# Patient Record
Sex: Female | Born: 1960 | Race: Black or African American | Hispanic: No | Smoking: Never smoker
Health system: Southern US, Community
[De-identification: ages and names within clinical notes are randomized; demographics above are authoritative.]

## PROBLEM LIST (undated history)

## (undated) DIAGNOSIS — G4730 Sleep apnea, unspecified: Secondary | ICD-10-CM

## (undated) DIAGNOSIS — N3010 Interstitial cystitis (chronic) without hematuria: Secondary | ICD-10-CM

## (undated) DIAGNOSIS — G894 Chronic pain syndrome: Secondary | ICD-10-CM

## (undated) DIAGNOSIS — M109 Gout, unspecified: Secondary | ICD-10-CM

## (undated) DIAGNOSIS — M4800 Spinal stenosis, site unspecified: Secondary | ICD-10-CM

## (undated) DIAGNOSIS — J189 Pneumonia, unspecified organism: Secondary | ICD-10-CM

## (undated) DIAGNOSIS — T8859XA Other complications of anesthesia, initial encounter: Secondary | ICD-10-CM

## (undated) DIAGNOSIS — K219 Gastro-esophageal reflux disease without esophagitis: Secondary | ICD-10-CM

## (undated) DIAGNOSIS — I10 Essential (primary) hypertension: Secondary | ICD-10-CM

## (undated) DIAGNOSIS — M797 Fibromyalgia: Secondary | ICD-10-CM

## (undated) HISTORY — PX: ABDOMINAL HYSTERECTOMY: SHX81

## (undated) HISTORY — PX: ROTATOR CUFF REPAIR: SHX139

## (undated) HISTORY — PX: THYROIDECTOMY: SHX17

## (undated) HISTORY — PX: OTHER SURGICAL HISTORY: SHX169

## (undated) HISTORY — PX: BLADDER SUSPENSION: SHX72

## (undated) HISTORY — PX: APPENDECTOMY: SHX54

## (undated) HISTORY — PX: TUBAL LIGATION: SHX77

## (undated) HISTORY — PX: ECTOPIC PREGNANCY SURGERY: SHX613

---

## 1992-07-26 DIAGNOSIS — K589 Irritable bowel syndrome without diarrhea: Secondary | ICD-10-CM | POA: Insufficient documentation

## 1993-07-26 DIAGNOSIS — L409 Psoriasis, unspecified: Secondary | ICD-10-CM | POA: Insufficient documentation

## 2002-07-27 DIAGNOSIS — K219 Gastro-esophageal reflux disease without esophagitis: Secondary | ICD-10-CM | POA: Insufficient documentation

## 2004-03-02 ENCOUNTER — Emergency Department: Payer: Self-pay | Admitting: Emergency Medicine

## 2004-03-09 ENCOUNTER — Encounter: Admission: RE | Admit: 2004-03-09 | Discharge: 2004-03-09 | Payer: Self-pay | Admitting: Unknown Physician Specialty

## 2004-04-15 ENCOUNTER — Other Ambulatory Visit: Payer: Self-pay

## 2004-04-15 ENCOUNTER — Inpatient Hospital Stay: Payer: Self-pay | Admitting: Anesthesiology

## 2005-05-13 ENCOUNTER — Emergency Department: Payer: Self-pay | Admitting: Emergency Medicine

## 2005-05-16 ENCOUNTER — Ambulatory Visit: Payer: Self-pay | Admitting: Emergency Medicine

## 2009-12-17 ENCOUNTER — Emergency Department: Payer: Self-pay | Admitting: Emergency Medicine

## 2009-12-27 ENCOUNTER — Emergency Department: Payer: Self-pay | Admitting: Emergency Medicine

## 2009-12-31 ENCOUNTER — Inpatient Hospital Stay: Payer: Self-pay | Admitting: Surgery

## 2010-01-07 ENCOUNTER — Ambulatory Visit: Payer: Self-pay | Admitting: Cardiovascular Disease

## 2010-08-15 ENCOUNTER — Inpatient Hospital Stay: Payer: Self-pay | Admitting: Internal Medicine

## 2010-12-26 ENCOUNTER — Ambulatory Visit: Payer: Self-pay | Admitting: Gastroenterology

## 2011-01-30 ENCOUNTER — Inpatient Hospital Stay: Payer: Self-pay | Admitting: Internal Medicine

## 2011-02-03 LAB — PATHOLOGY REPORT

## 2011-02-07 DIAGNOSIS — E039 Hypothyroidism, unspecified: Secondary | ICD-10-CM | POA: Insufficient documentation

## 2011-06-23 ENCOUNTER — Ambulatory Visit: Payer: Self-pay | Admitting: Rehabilitation

## 2011-07-27 DIAGNOSIS — N3010 Interstitial cystitis (chronic) without hematuria: Secondary | ICD-10-CM | POA: Insufficient documentation

## 2011-08-03 DIAGNOSIS — M48061 Spinal stenosis, lumbar region without neurogenic claudication: Secondary | ICD-10-CM | POA: Insufficient documentation

## 2011-09-24 ENCOUNTER — Emergency Department: Payer: Self-pay | Admitting: Emergency Medicine

## 2011-09-24 LAB — COMPREHENSIVE METABOLIC PANEL
Albumin: 3.8 g/dL (ref 3.4–5.0)
Alkaline Phosphatase: 79 U/L (ref 50–136)
Anion Gap: 7 (ref 7–16)
BUN: 17 mg/dL (ref 7–18)
Bilirubin,Total: 0.3 mg/dL (ref 0.2–1.0)
Calcium, Total: 8.6 mg/dL (ref 8.5–10.1)
Chloride: 108 mmol/L — ABNORMAL HIGH (ref 98–107)
Co2: 26 mmol/L (ref 21–32)
Creatinine: 1.14 mg/dL (ref 0.60–1.30)
EGFR (African American): >60
EGFR (Non-African Amer.): 56 — ABNORMAL LOW
Glucose: 98 mg/dL (ref 65–99)
Osmolality: 283 (ref 275–301)
Potassium: 3.9 mmol/L (ref 3.5–5.1)
SGOT(AST): 18 U/L (ref 15–37)
SGPT (ALT): 18 U/L
Sodium: 141 mmol/L (ref 136–145)
Total Protein: 7.7 g/dL (ref 6.4–8.2)

## 2011-09-24 LAB — CBC
HCT: 38.1 % (ref 35.0–47.0)
HGB: 12.6 g/dL (ref 12.0–16.0)
MCH: 29.9 pg (ref 26.0–34.0)
MCHC: 33.1 g/dL (ref 32.0–36.0)
MCV: 90 fL (ref 80–100)
Platelet: 271 10*3/uL (ref 150–440)
RBC: 4.22 10*6/uL (ref 3.80–5.20)
RDW: 13.4 % (ref 11.5–14.5)
WBC: 7.8 10*3/uL (ref 3.6–11.0)

## 2011-09-24 LAB — URINALYSIS, COMPLETE
Bilirubin,UR: NEGATIVE
Blood: NEGATIVE
Glucose,UR: NEGATIVE mg/dL (ref 0–75)
Ketone: NEGATIVE
Leukocyte Esterase: NEGATIVE
Nitrite: NEGATIVE
Ph: 6 (ref 4.5–8.0)
Protein: NEGATIVE
RBC,UR: <1 /HPF (ref 0–5)
Specific Gravity: 1.027 (ref 1.003–1.030)
Squamous Epithelial: <1
WBC UR: <1 /HPF (ref 0–5)

## 2011-09-24 LAB — LIPASE, BLOOD: Lipase: 107 U/L (ref 73–393)

## 2011-10-19 ENCOUNTER — Emergency Department: Payer: Self-pay

## 2011-10-19 LAB — COMPREHENSIVE METABOLIC PANEL
Albumin: 3.6 g/dL (ref 3.4–5.0)
Alkaline Phosphatase: 89 U/L (ref 50–136)
Anion Gap: 7 (ref 7–16)
BUN: 17 mg/dL (ref 7–18)
Bilirubin,Total: 0.3 mg/dL (ref 0.2–1.0)
Calcium, Total: 8.9 mg/dL (ref 8.5–10.1)
Chloride: 106 mmol/L (ref 98–107)
Co2: 29 mmol/L (ref 21–32)
Creatinine: 1.29 mg/dL (ref 0.60–1.30)
EGFR (African American): 55 — ABNORMAL LOW
EGFR (Non-African Amer.): 48 — ABNORMAL LOW
Glucose: 81 mg/dL (ref 65–99)
Osmolality: 284 (ref 275–301)
Potassium: 4.3 mmol/L (ref 3.5–5.1)
SGOT(AST): 16 U/L (ref 15–37)
SGPT (ALT): 17 U/L
Sodium: 142 mmol/L (ref 136–145)
Total Protein: 7.6 g/dL (ref 6.4–8.2)

## 2011-10-19 LAB — URINALYSIS, COMPLETE
Bacteria: NONE SEEN
Bilirubin,UR: NEGATIVE
Blood: NEGATIVE
Glucose,UR: NEGATIVE mg/dL (ref 0–75)
Ketone: NEGATIVE
Leukocyte Esterase: NEGATIVE
Nitrite: NEGATIVE
Ph: 5 (ref 4.5–8.0)
Protein: NEGATIVE
RBC,UR: 1 /HPF (ref 0–5)
Specific Gravity: 1.028 (ref 1.003–1.030)
Squamous Epithelial: NONE SEEN
WBC UR: 1 /HPF (ref 0–5)

## 2011-10-19 LAB — CBC
HCT: 38.4 % (ref 35.0–47.0)
HGB: 12 g/dL (ref 12.0–16.0)
MCH: 28.4 pg (ref 26.0–34.0)
MCHC: 31.2 g/dL — ABNORMAL LOW (ref 32.0–36.0)
MCV: 91 fL (ref 80–100)
Platelet: 292 10*3/uL (ref 150–440)
RBC: 4.21 10*6/uL (ref 3.80–5.20)
RDW: 13.5 % (ref 11.5–14.5)
WBC: 6.8 10*3/uL (ref 3.6–11.0)

## 2011-10-19 LAB — LIPASE, BLOOD: Lipase: 95 U/L (ref 73–393)

## 2011-10-30 ENCOUNTER — Ambulatory Visit: Payer: Self-pay | Admitting: Gastroenterology

## 2011-11-15 ENCOUNTER — Emergency Department: Payer: Self-pay

## 2011-11-15 LAB — CBC
HCT: 41.1 % (ref 35.0–47.0)
HGB: 12.9 g/dL (ref 12.0–16.0)
MCH: 28.8 pg (ref 26.0–34.0)
MCHC: 31.4 g/dL — ABNORMAL LOW (ref 32.0–36.0)
MCV: 91 fL (ref 80–100)
Platelet: 279 10*3/uL (ref 150–440)
RBC: 4.49 10*6/uL (ref 3.80–5.20)
RDW: 13.3 % (ref 11.5–14.5)
WBC: 7.6 10*3/uL (ref 3.6–11.0)

## 2011-11-15 LAB — URINALYSIS, COMPLETE
Bilirubin,UR: NEGATIVE
Blood: NEGATIVE
Glucose,UR: NEGATIVE mg/dL (ref 0–75)
Ketone: NEGATIVE
Leukocyte Esterase: NEGATIVE
Nitrite: NEGATIVE
Ph: 6 (ref 4.5–8.0)
Protein: NEGATIVE
RBC,UR: 1 /HPF (ref 0–5)
Specific Gravity: 1.029 (ref 1.003–1.030)
Squamous Epithelial: 1
WBC UR: 2 /HPF (ref 0–5)

## 2011-11-15 LAB — COMPREHENSIVE METABOLIC PANEL
Albumin: 3.8 g/dL (ref 3.4–5.0)
Alkaline Phosphatase: 102 U/L (ref 50–136)
Anion Gap: 9 (ref 7–16)
BUN: 14 mg/dL (ref 7–18)
Bilirubin,Total: 0.3 mg/dL (ref 0.2–1.0)
Calcium, Total: 9.1 mg/dL (ref 8.5–10.1)
Chloride: 107 mmol/L (ref 98–107)
Co2: 26 mmol/L (ref 21–32)
Creatinine: 1.07 mg/dL (ref 0.60–1.30)
EGFR (African American): >60
EGFR (Non-African Amer.): 60 — ABNORMAL LOW
Glucose: 112 mg/dL — ABNORMAL HIGH (ref 65–99)
Osmolality: 284 (ref 275–301)
Potassium: 4 mmol/L (ref 3.5–5.1)
SGOT(AST): 16 U/L (ref 15–37)
SGPT (ALT): 21 U/L
Sodium: 142 mmol/L (ref 136–145)
Total Protein: 8.2 g/dL (ref 6.4–8.2)

## 2011-11-15 LAB — LIPASE, BLOOD: Lipase: 61 U/L — ABNORMAL LOW (ref 73–393)

## 2011-11-20 DIAGNOSIS — I10 Essential (primary) hypertension: Secondary | ICD-10-CM | POA: Insufficient documentation

## 2011-11-20 DIAGNOSIS — N200 Calculus of kidney: Secondary | ICD-10-CM | POA: Insufficient documentation

## 2011-11-21 ENCOUNTER — Ambulatory Visit: Payer: Self-pay | Admitting: Gastroenterology

## 2011-11-27 ENCOUNTER — Ambulatory Visit: Payer: Self-pay | Admitting: Gastroenterology

## 2011-12-06 DIAGNOSIS — N3946 Mixed incontinence: Secondary | ICD-10-CM | POA: Insufficient documentation

## 2012-05-24 ENCOUNTER — Ambulatory Visit: Payer: Self-pay

## 2012-05-31 DIAGNOSIS — F32A Depression, unspecified: Secondary | ICD-10-CM | POA: Insufficient documentation

## 2012-10-23 ENCOUNTER — Emergency Department: Payer: Self-pay | Admitting: Emergency Medicine

## 2012-10-23 LAB — CBC
HCT: 40.8 % (ref 35.0–47.0)
HGB: 13 g/dL (ref 12.0–16.0)
MCH: 27.7 pg (ref 26.0–34.0)
MCHC: 31.9 g/dL — ABNORMAL LOW (ref 32.0–36.0)
MCV: 87 fL (ref 80–100)
Platelet: 340 10*3/uL (ref 150–440)
RBC: 4.69 10*6/uL (ref 3.80–5.20)
RDW: 13.7 % (ref 11.5–14.5)
WBC: 9.7 10*3/uL (ref 3.6–11.0)

## 2012-10-23 LAB — COMPREHENSIVE METABOLIC PANEL
Albumin: 3.9 g/dL (ref 3.4–5.0)
Alkaline Phosphatase: 81 U/L (ref 50–136)
Anion Gap: 4 — ABNORMAL LOW (ref 7–16)
BUN: 16 mg/dL (ref 7–18)
Bilirubin,Total: 0.5 mg/dL (ref 0.2–1.0)
Calcium, Total: 9.6 mg/dL (ref 8.5–10.1)
Chloride: 105 mmol/L (ref 98–107)
Co2: 30 mmol/L (ref 21–32)
Creatinine: 1.28 mg/dL (ref 0.60–1.30)
EGFR (African American): 55 — ABNORMAL LOW
EGFR (Non-African Amer.): 48 — ABNORMAL LOW
Glucose: 94 mg/dL (ref 65–99)
Osmolality: 278 (ref 275–301)
Potassium: 3.9 mmol/L (ref 3.5–5.1)
SGOT(AST): 20 U/L (ref 15–37)
SGPT (ALT): 21 U/L (ref 12–78)
Sodium: 139 mmol/L (ref 136–145)
Total Protein: 8.1 g/dL (ref 6.4–8.2)

## 2012-10-23 LAB — LIPASE, BLOOD: Lipase: 104 U/L (ref 73–393)

## 2012-10-24 LAB — URINALYSIS, COMPLETE
Bilirubin,UR: NEGATIVE
Glucose,UR: NEGATIVE mg/dL (ref 0–75)
Ketone: NEGATIVE
Leukocyte Esterase: NEGATIVE
Nitrite: NEGATIVE
Ph: 5 (ref 4.5–8.0)
Protein: NEGATIVE
RBC,UR: 1 /HPF (ref 0–5)
Specific Gravity: 1.015 (ref 1.003–1.030)
Squamous Epithelial: 10
WBC UR: 1 /HPF (ref 0–5)

## 2013-01-06 DIAGNOSIS — M4317 Spondylolisthesis, lumbosacral region: Secondary | ICD-10-CM | POA: Insufficient documentation

## 2013-01-19 ENCOUNTER — Emergency Department: Payer: Self-pay | Admitting: Internal Medicine

## 2013-01-29 DIAGNOSIS — R3915 Urgency of urination: Secondary | ICD-10-CM | POA: Insufficient documentation

## 2013-01-29 DIAGNOSIS — R399 Unspecified symptoms and signs involving the genitourinary system: Secondary | ICD-10-CM | POA: Insufficient documentation

## 2013-01-29 DIAGNOSIS — R350 Frequency of micturition: Secondary | ICD-10-CM | POA: Insufficient documentation

## 2013-02-03 DIAGNOSIS — R0683 Snoring: Secondary | ICD-10-CM | POA: Insufficient documentation

## 2013-03-05 ENCOUNTER — Emergency Department: Payer: Self-pay | Admitting: Emergency Medicine

## 2013-03-13 ENCOUNTER — Ambulatory Visit: Payer: Self-pay

## 2013-05-19 DIAGNOSIS — M19079 Primary osteoarthritis, unspecified ankle and foot: Secondary | ICD-10-CM | POA: Insufficient documentation

## 2014-03-04 ENCOUNTER — Encounter: Payer: Self-pay | Admitting: Orthopedic Surgery

## 2014-03-15 ENCOUNTER — Encounter: Payer: Self-pay | Admitting: Orthopedic Surgery

## 2014-03-31 DIAGNOSIS — R0789 Other chest pain: Secondary | ICD-10-CM | POA: Insufficient documentation

## 2014-06-08 ENCOUNTER — Emergency Department: Payer: Self-pay | Admitting: Student

## 2014-06-08 LAB — COMPREHENSIVE METABOLIC PANEL
Albumin: 4.1 g/dL (ref 3.4–5.0)
Alkaline Phosphatase: 100 U/L
Anion Gap: 7 (ref 7–16)
BUN: 17 mg/dL (ref 7–18)
Bilirubin,Total: 0.6 mg/dL (ref 0.2–1.0)
Calcium, Total: 9.5 mg/dL (ref 8.5–10.1)
Chloride: 102 mmol/L (ref 98–107)
Co2: 28 mmol/L (ref 21–32)
Creatinine: 1.56 mg/dL — ABNORMAL HIGH (ref 0.60–1.30)
EGFR (African American): 45 — ABNORMAL LOW
EGFR (Non-African Amer.): 37 — ABNORMAL LOW
Glucose: 90 mg/dL (ref 65–99)
Osmolality: 275 (ref 275–301)
Potassium: 3.2 mmol/L — ABNORMAL LOW (ref 3.5–5.1)
SGOT(AST): 13 U/L — ABNORMAL LOW (ref 15–37)
SGPT (ALT): 17 U/L
Sodium: 137 mmol/L (ref 136–145)
Total Protein: 8.4 g/dL — ABNORMAL HIGH (ref 6.4–8.2)

## 2014-06-08 LAB — URINALYSIS, COMPLETE
Bilirubin,UR: NEGATIVE
Blood: NEGATIVE
Glucose,UR: NEGATIVE mg/dL (ref 0–75)
Hyaline Cast: 5
Ketone: NEGATIVE
Leukocyte Esterase: NEGATIVE
Nitrite: NEGATIVE
Ph: 5 (ref 4.5–8.0)
Protein: NEGATIVE
RBC,UR: 2 /HPF (ref 0–5)
Specific Gravity: 1.024 (ref 1.003–1.030)
Squamous Epithelial: 7
WBC UR: 2 /HPF (ref 0–5)

## 2014-06-08 LAB — CBC
HCT: 42.9 % (ref 35.0–47.0)
HGB: 14.1 g/dL (ref 12.0–16.0)
MCH: 28.4 pg (ref 26.0–34.0)
MCHC: 32.9 g/dL (ref 32.0–36.0)
MCV: 87 fL (ref 80–100)
Platelet: 326 10*3/uL (ref 150–440)
RBC: 4.96 10*6/uL (ref 3.80–5.20)
RDW: 14.3 % (ref 11.5–14.5)
WBC: 9.6 10*3/uL (ref 3.6–11.0)

## 2014-06-08 LAB — LIPASE, BLOOD: Lipase: 94 U/L (ref 73–393)

## 2014-06-10 LAB — COMPREHENSIVE METABOLIC PANEL
Albumin: 3.5 g/dL (ref 3.4–5.0)
Alkaline Phosphatase: 90 U/L (ref 46–116)
Anion Gap: 5 — ABNORMAL LOW (ref 7–16)
BUN: 13 mg/dL (ref 7–18)
Bilirubin,Total: 0.3 mg/dL (ref 0.2–1.0)
Calcium, Total: 8.6 mg/dL (ref 8.5–10.1)
Chloride: 107 mmol/L (ref 98–107)
Co2: 31 mmol/L (ref 21–32)
Creatinine: 1.23 mg/dL (ref 0.60–1.30)
EGFR (African American): 59 — ABNORMAL LOW
EGFR (Non-African Amer.): 48 — ABNORMAL LOW
Glucose: 100 mg/dL — ABNORMAL HIGH (ref 65–99)
Osmolality: 285 (ref 275–301)
Potassium: 3.9 mmol/L (ref 3.5–5.1)
SGOT(AST): 13 U/L — ABNORMAL LOW (ref 15–37)
SGPT (ALT): 17 U/L (ref 14–63)
Sodium: 143 mmol/L (ref 136–145)
Total Protein: 7.2 g/dL (ref 6.4–8.2)

## 2014-06-10 LAB — URINALYSIS, COMPLETE
Bilirubin,UR: NEGATIVE
Blood: NEGATIVE
Glucose,UR: NEGATIVE mg/dL (ref 0–75)
Ketone: NEGATIVE
Leukocyte Esterase: NEGATIVE
Nitrite: NEGATIVE
Ph: 6 (ref 4.5–8.0)
Protein: NEGATIVE
RBC,UR: 1 /HPF (ref 0–5)
Specific Gravity: 1.023 (ref 1.003–1.030)
Squamous Epithelial: 2
WBC UR: 1 /HPF (ref 0–5)

## 2014-06-10 LAB — CBC WITH DIFFERENTIAL/PLATELET
Basophil #: 0.1 10*3/uL (ref 0.0–0.1)
Basophil %: 1 %
Eosinophil #: 0.2 10*3/uL (ref 0.0–0.7)
Eosinophil %: 2.3 %
HCT: 39.4 % (ref 35.0–47.0)
HGB: 12.6 g/dL (ref 12.0–16.0)
Lymphocyte #: 2.2 10*3/uL (ref 1.0–3.6)
Lymphocyte %: 27 %
MCH: 28.1 pg (ref 26.0–34.0)
MCHC: 32 g/dL (ref 32.0–36.0)
MCV: 88 fL (ref 80–100)
Monocyte #: 0.4 x10 3/mm (ref 0.2–0.9)
Monocyte %: 4.4 %
Neutrophil #: 5.4 10*3/uL (ref 1.4–6.5)
Neutrophil %: 65.3 %
Platelet: 297 10*3/uL (ref 150–440)
RBC: 4.5 10*6/uL (ref 3.80–5.20)
RDW: 14 % (ref 11.5–14.5)
WBC: 8.3 10*3/uL (ref 3.6–11.0)

## 2014-06-10 LAB — LIPASE, BLOOD: Lipase: 81 U/L (ref 73–393)

## 2014-06-11 LAB — TSH: Thyroid Stimulating Horm: 6.85 u[IU]/mL — ABNORMAL HIGH

## 2014-06-11 LAB — HEMOGLOBIN A1C: Hemoglobin A1C: 5.5 % (ref 4.2–6.3)

## 2014-06-12 LAB — BASIC METABOLIC PANEL
Anion Gap: 8 (ref 7–16)
BUN: 16 mg/dL (ref 7–18)
Calcium, Total: 8.8 mg/dL (ref 8.5–10.1)
Chloride: 111 mmol/L — ABNORMAL HIGH (ref 98–107)
Co2: 22 mmol/L (ref 21–32)
Creatinine: 1.1 mg/dL (ref 0.60–1.30)
EGFR (African American): >60
EGFR (Non-African Amer.): 55 — ABNORMAL LOW
Glucose: 115 mg/dL — ABNORMAL HIGH (ref 65–99)
Osmolality: 283 (ref 275–301)
Potassium: 5 mmol/L (ref 3.5–5.1)
Sodium: 141 mmol/L (ref 136–145)

## 2014-06-12 LAB — COMPREHENSIVE METABOLIC PANEL
Albumin: 3.1 g/dL — ABNORMAL LOW (ref 3.4–5.0)
Alkaline Phosphatase: 74 U/L (ref 46–116)
Anion Gap: 3 — ABNORMAL LOW (ref 7–16)
BUN: 19 mg/dL — ABNORMAL HIGH (ref 7–18)
Bilirubin,Total: 0.3 mg/dL (ref 0.2–1.0)
Calcium, Total: 8.6 mg/dL (ref 8.5–10.1)
Chloride: 108 mmol/L — ABNORMAL HIGH (ref 98–107)
Co2: 30 mmol/L (ref 21–32)
Creatinine: 1.33 mg/dL — ABNORMAL HIGH (ref 0.60–1.30)
EGFR (African American): 54 — ABNORMAL LOW
EGFR (Non-African Amer.): 44 — ABNORMAL LOW
Glucose: 99 mg/dL (ref 65–99)
Osmolality: 284 (ref 275–301)
Potassium: 4.2 mmol/L (ref 3.5–5.1)
SGOT(AST): 20 U/L (ref 15–37)
SGPT (ALT): 16 U/L (ref 14–63)
Sodium: 141 mmol/L (ref 136–145)
Total Protein: 6.7 g/dL (ref 6.4–8.2)

## 2014-06-12 LAB — CBC WITH DIFFERENTIAL/PLATELET
Basophil #: 0.1 10*3/uL (ref 0.0–0.1)
Basophil %: 0.9 %
Eosinophil #: 0.2 10*3/uL (ref 0.0–0.7)
Eosinophil %: 2.8 %
HCT: 37.9 % (ref 35.0–47.0)
HGB: 11.7 g/dL — ABNORMAL LOW (ref 12.0–16.0)
Lymphocyte #: 2.3 10*3/uL (ref 1.0–3.6)
Lymphocyte %: 36.5 %
MCH: 27.6 pg (ref 26.0–34.0)
MCHC: 31 g/dL — ABNORMAL LOW (ref 32.0–36.0)
MCV: 89 fL (ref 80–100)
Monocyte #: 0.4 x10 3/mm (ref 0.2–0.9)
Monocyte %: 6.5 %
Neutrophil #: 3.3 10*3/uL (ref 1.4–6.5)
Neutrophil %: 53.3 %
Platelet: 285 10*3/uL (ref 150–440)
RBC: 4.26 10*6/uL (ref 3.80–5.20)
RDW: 13.9 % (ref 11.5–14.5)
WBC: 6.2 10*3/uL (ref 3.6–11.0)

## 2014-06-12 LAB — T4, FREE: Free Thyroxine: 0.99 ng/dL (ref 0.76–1.46)

## 2014-06-14 ENCOUNTER — Inpatient Hospital Stay: Payer: Self-pay | Admitting: Internal Medicine

## 2014-06-16 LAB — COMPREHENSIVE METABOLIC PANEL
Albumin: 3.2 g/dL — ABNORMAL LOW (ref 3.4–5.0)
Alkaline Phosphatase: 75 U/L (ref 46–116)
Anion Gap: 5 — ABNORMAL LOW (ref 7–16)
BUN: 14 mg/dL (ref 7–18)
Bilirubin,Total: 0.4 mg/dL (ref 0.2–1.0)
Calcium, Total: 8.8 mg/dL (ref 8.5–10.1)
Chloride: 101 mmol/L (ref 98–107)
Co2: 31 mmol/L (ref 21–32)
Creatinine: 1.48 mg/dL — ABNORMAL HIGH (ref 0.60–1.30)
EGFR (African American): 47 — ABNORMAL LOW
EGFR (Non-African Amer.): 39 — ABNORMAL LOW
Glucose: 113 mg/dL — ABNORMAL HIGH (ref 65–99)
Osmolality: 275 (ref 275–301)
Potassium: 3.8 mmol/L (ref 3.5–5.1)
SGOT(AST): 12 U/L — ABNORMAL LOW (ref 15–37)
SGPT (ALT): 15 U/L (ref 14–63)
Sodium: 137 mmol/L (ref 136–145)
Total Protein: 6.8 g/dL (ref 6.4–8.2)

## 2014-06-16 LAB — CBC WITH DIFFERENTIAL/PLATELET
Basophil #: 0.1 10*3/uL (ref 0.0–0.1)
Basophil %: 1 %
Eosinophil #: 0.4 10*3/uL (ref 0.0–0.7)
Eosinophil %: 3.9 %
HCT: 35.6 % (ref 35.0–47.0)
HGB: 11.2 g/dL — ABNORMAL LOW (ref 12.0–16.0)
Lymphocyte #: 2.5 10*3/uL (ref 1.0–3.6)
Lymphocyte %: 25.5 %
MCH: 28.3 pg (ref 26.0–34.0)
MCHC: 31.5 g/dL — ABNORMAL LOW (ref 32.0–36.0)
MCV: 90 fL (ref 80–100)
Monocyte #: 0.5 x10 3/mm (ref 0.2–0.9)
Monocyte %: 4.7 %
Neutrophil #: 6.4 10*3/uL (ref 1.4–6.5)
Neutrophil %: 64.9 %
Platelet: 272 10*3/uL (ref 150–440)
RBC: 3.97 10*6/uL (ref 3.80–5.20)
RDW: 14.6 % — ABNORMAL HIGH (ref 11.5–14.5)
WBC: 9.9 10*3/uL (ref 3.6–11.0)

## 2014-09-06 NOTE — Consult Note (Signed)
PATIENT NAME:  Joanna James, Joanna James MR#:  [id number] DATE OF BIRTH:  08-19-1960  DATE OF CONSULTATION:  09/25/2011  REFERRING PHYSICIAN:  Janalyn Harder, MD CONSULTING PHYSICIAN:  Starleen Arms, MD  PRIMARY CARE PHYSICIAN: None local.  REASON FOR CONSULTATION:  Acute on-chronic abdominal pain.  HISTORY OF PRESENT ILLNESS: Joanna James is a 54 year old female with past medical history significant for chronic abdominal pain with multiple admissions to [facility] and [facility] with extensive work-up done without any significant finding. It is thought it might be due to recurrent diverticulitis episodes versus endometriosis versus irritable bowel syndrome versus nephrolithiasis. She came to the hospital today complaining of right upper abdominal pain, complaining of nausea and vomiting for the last day, as well as the patient was complaining of diarrhea. The patient reports she had this episode of pain recently where she went to [facility] in March where she had a CT of the abdomen and pelvis done where they did not find any abnormality. As well the patient has had multiple admissions to [facility] for the same complaints where she had three CAT scans done last year already the last one being in August of 2012. The patient has been followed by gastroenterology, Dr. Marva Panda, where she had an EGD done in August of last year and a colonoscopy. The colonoscopy showed diverticulosis and there was a tight stricture 6 cm from the anus where they were unable to pass the scope after that. EGD in September 2012 did show gastritis as well as gastric ulcer.   PAST MEDICAL HISTORY:  1. Chronic abdominal pain with several admissions for the same reason with last admission in September 2012. The patient has been seen last in March of this year.  2. Hypothyroidism.  3. Morbid obesity.  4. Recurrent attack of diverticulitis.  5. Nephrolithiasis.  6. Degenerative disk disease.   7. Irritable bowel syndrome.  8. Endometriosis status post hysterectomy and oophorectomy.  PAST SURGICAL HISTORY:  1. Laparoscopic procedure for endometriosis.  2. Hysterectomy. 3. Oophorectomy. 4. Appendectomy.  5. Partial thyroidectomy for goiter.   ALLERGIES: Erythromycin, Cipro, E. E. S., and sulfa.  SOCIAL HISTORY: She lives at home with her family. No history of smoking, alcohol, or drug use.   FAMILY HISTORY: There is hypertension in the family. Mother has irritable bowel syndrome.   REVIEW OF SYSTEMS: CONSTITUTIONAL: The patient denies any fever, weakness, or fatigue. EYES: Denies any blurry vision, pain, or redness. ENT: Denies any tinnitus, ear pain, or hearing loss. RESPIRATORY: Denies any cough, wheezing, or hemoptysis. CARDIOVASCULAR: Denies any chest pain, palpitations, or syncope. GASTROINTESTINAL: Complains of nausea and vomiting, nothing since presentation to the ED. Complains of abdominal pain mainly in the right upper quadrant area. Apparently this pain has been going on for weeks, but has gotten worse over the last week. GU: Denies any dysuria, hematuria, or renal colic. ENDOCRINE: Denies any polyuria, polydipsia, or heat or cold intolerance. INTEGUMENT: Denies any acne, rash, or itching. NEUROLOGIC: Denies any dysarthria, epilepsy, tremors, or vertigo. PSYCH: Denies any substance abuse or alcohol abuse.   PHYSICAL EXAMINATION:   VITAL SIGNS: Temperature 97.4, pulse 78, respiratory rate 20, blood pressure 142/70, and saturating 95% on room air.   GENERAL: Obese female is comfortable and in no apparent distress.   HEENT: Head atraumatic, normocephalic. Pupils are equal and reactive to light. Pink conjunctivae. Anicteric sclerae. Moist oral mucosa.   NECK: Supple. No thyromegaly. No JVD.   CHEST: Good air entry bilaterally. No wheezing,  rales, or rhonchi.   CARDIOVASCULAR: S1 and S2 heard. No rubs, murmurs, or gallops.   ABDOMEN: Obese, soft, very mild tenderness  in the right quadrant. No rebound. No guarding. Bowel sounds present.   EXTREMITIES: No edema. No clubbing.   PERTINENT LABS/STUDIES: Glucose 98, BUN 17, creatinine 1.14, sodium 141, potassium 3.9, chloride 108, CO2 26, anion gap 7, total protein 7.7, albumin 3.8, total bilirubin 0.3, alkaline phosphatase 79, AST 18, AND ALT 18. White blood cells 7.8, hemoglobin 12.6, hematocrit 38.1, AND platelets 271.   Urinalysis is negative.   Ultrasound of right upper quadrant is showing 3 mm gallbladder wall polyp. Gallbladder wall is upper limits of normal at 3 mm thickness. No gallstones. Common bile duct is unremarkable. No stones or dilatation.   ASSESSMENT AND PLAN: Acute on chronic abdominal pain - the patient has been dealing with similar pain for several years and now with multiple admissions and extensive work-up. She has a total of three [facility] and [facility]. There have been no significant findings. This is very likely related to irritable bowel syndrome versus endometriosis versus small recurrent episodes of diverticulitis, but this is unlikely as the patient does not have any fever, any white count. The patient was instructed to follow-up with her primary care physician as [facility] and was instructed as well to follow-up with her GI physician, Dr. Marva PandaSkulskie, within one week from discharge. The patient will be given a prescription for Protonix 40 mg p.o. daily as her last EGD done in September 2012 showed evidence of gastritis.   TOTAL TIME SPENT ON PATIENT CARE: 40 minutes.  ____________________________ Starleen Armsawood S. Nathan Stallworth, MD dse:slb D: 09/25/2011 06:52:07 ET     T: 09/25/2011 10:08:37 ET        JOB#: [id number] cc: Starleen Armsawood S. Briar Witherspoon, MD, <Dictator> Sammy Douthitt Teena IraniS Maxie Slovacek MD ELECTRONICALLY SIGNED 09/26/2011 1:18

## 2014-09-09 DIAGNOSIS — F8081 Childhood onset fluency disorder: Secondary | ICD-10-CM | POA: Insufficient documentation

## 2014-09-13 NOTE — Discharge Summary (Signed)
PATIENT NAME:  Joanna James, Joanna James MR#:  [id number] DATE OF BIRTH:  1961/01/05  DATE OF ADMISSION:  06/14/2014 DATE OF DISCHARGE:  06/16/2014  For a detailed note, please take a look at the history and physical done on admission by Dr. Angelica Ranavid Hower.   DIAGNOSES AT DISCHARGE: As follows: 1.  Chronic lower abdominal pain.  2.  Chronic pain syndrome.  3.  Anxiety, conversion disorder.  4. Slurred speech and difficulty walking likely related to suspected anxiety and conversion disorder.  5.  Acute renal failure, now resolved.  6.  Hypothyroidism.  7.  Depression.  8.  Urinary incontinence.   DIET: The patient is being discharged in a low-sodium, low-fat, mechanical soft diet.   ACTIVITY: As tolerated.  FOLLOWUP:  [facility] the next 1-2 weeks. Also follow up with her primary care physician in the next 1-2 weeks.    DISCHARGE MEDICATIONS: Clotrimazole topical applied b.i.d., meloxicam 15 mg daily, mexiletine 150 mg q. 8 hours, omeprazole 20 mg daily, Lyrica 150 mg t.i.d., Effexor 37.5 mg 2 caps at bedtime, oxycodone 10 mg q. 6 hours as needed, Synthroid 175 mcg at bedtime, oxybutynin 5 mg daily, hydroxyzine 150 mg at bedtime, Lidoderm patch to be applied daily, aspirin 81 mg daily, lidocaine 1% injection as needed, amitriptyline 50 mg 2 tabs at bedtime, hyoscyamine 0.125 mg q. 6 hours as needed, Myrbetriq 25 mg daily, Elmiron 100 mg 1 cap t.i.d. weekly, oxycodone 5 mg q. 6 hours as needed, Zofran 4 mg q. 8 hours as needed, Dilaudid 2 mg every 4 hours as needed.   CONSULTANTS DURING THE HOSPITAL COURSE: Audery AmelJohn T. Clapacs, MD, Jannet MantisSurya K. Guss Bundehalla, MD, from psychiatry, Pauletta BrownsYuriy Zeylikman, MD, from neurology, Midge Miniumarren Wohl, MD, from gastroenterology.  PERTINENT STUDIES DONE DURING THE HOSPITAL COURSE: As follows: CT scan of the head done without contrast showing no acute findings. MRI of the brain and cervical spine  showing no evidence of any acute intracranial abnormality. A CT angiography of the  abdomen and pelvis done with and without contrast showing no significant vascular abnormality, no evidence of mesenteric ischemia, possible constipation, small hiatal hernia, esophageal air-fluid levels suggestive of dysmotility or GERD. Chronic increased density in the mesenteric fat, this may represent  mesenteric panniculitis, nephrolithiasis.   The patient also had a CT scan of the abdomen and pelvis done with contrast on January 25 showing 2 nonobstructive calculi in the left renal collecting system. No ureteral stones. Mild colonic diverticulosis. No definite acute finding to account for the patient's abdominal pelvic pain.   HOSPITAL COURSE: This is a 54 year old female who presented to the hospital with abdominal pain, nausea, vomiting.   Problem #1.  Lower abdominal pain, nausea, vomiting. The exact etiology of this is still unclear, but thought to be related to functional abdominal pain secondary to irritable bowel syndrome versus related to underlying mesenteric panniculitis. The patient had undergone an extensive workup including a CT scan of the abdomen and pelvis initially, which was negative, and again a CT angiography of the abdomen and pelvis, which showed no evidence of mesenteric ischemia, but did show some panniculitis. There was some concern that her pain was also related to constipation and she was put on laxatives. She did have multiple bowel movements, but her pain persisted. GI did follow the patient, did not recommend any further intervention. They recommended [facility]. She may need a surgical consult for possible laparoscopy to look for a source of her chronic  pain. The mesenteric panniculitis did not require any antibiotics at this point as the patient was afebrile with a normal white cell count as per gastroenterology.   Problem #2.  Persistent nausea, vomiting. This was likely related to her irritable bowel and her chronic abdominal pain. She  received supportive care with antiemetics. Her diet was initially liquid, eventually advanced to a soft diet, which she is currently tolerating well with no further nausea, vomiting.   Problem #3.  Hypothyroidism. The patient was maintained on her Synthroid. She will resume this.   Problem #4.  Chronic pain syndrome. The patient was maintained on her Dilaudid and oxycodone. She will continue that.  Problem #5.  Slurred speech and difficulty walking. The exact etiology of this is still unclear, but suspected to be related to anxiety, malingering, or conversion disorder. The patient underwent an extensive neurologic workup including a CT head, MRI of the brain, MRI of the cervical spine, which was essentially negative. She was seen by neurology who did not think that the source of her slurred speech and difficulty walking was related to a neurologic source. I did obtain a psychiatric consult. The patient was seen by Dr. Toni Amend, who agreed that the patient's symptoms were probably more psychogenic than neurologic in nature, but the patient did not require any acute inpatient psychiatric care. The patient was seen by physical therapy. They recommended rehabilitation, but since we did not have a diagnosis and there was no acute rehabilitative need, the patient could not be discharged to a skilled nursing facility, plus due to insurance reasons that she had Medicaid, she could not be discharged there. I did obtain a speech consult and they recommended outpatient speech, which I did refer her to. At this point, since we have exhausted her workup for slurred speech and difficulty walking, she was discharged home. The daughter did want her to be transferred to [facility], but did not need an acute facility for facility transfer. I did advise the daughter she could take her mother to the Emergency Room at [facility] and have her re-evaluated by an ER physician and get admitted at [facility].   Problem #6.  Acute renal failure. The  patient received some IV fluids and this has resolved.  Problem #7.  Urinary incontinence. The patient was maintained on her Myrbetriq and oxybutynin and she will resume that.   CODE STATUS: The patient is a full code.   DISPOSITION: She was discharged home.  TIME SPENT:  50 minutes.   ____________________________ Rolly Pancake. Cherlynn Kaiser, MD vjs:LT D: 06/16/2014 16:30:00 ET T: 06/16/2014 20:21:38 ET JOB#: [id number]  cc: [facility] Rolly Pancake. Cherlynn Kaiser, MD, <Dictator>  Houston Siren MD ELECTRONICALLY SIGNED 06/27/2014 12:51

## 2014-09-13 NOTE — H&P (Signed)
PATIENT NAME:  Joanna James, Joanna James MR#:  [id number] DATE OF BIRTH:  02-18-1961  DATE OF ADMISSION:  06/11/2014  REFERRING PHYSICIAN:  Bobetta Lime A. Inocencio Homes, MD  PRIMARY CARE PHYSICIAN:  [facility] primary. Previously followed with Dr. Marva Panda of GI, now follows with [facility] for GI.   CHIEF COMPLAINT:  Abdominal pain.   HISTORY OF PRESENT ILLNESS:  This is a 54 year old African-American female with a history of fibromyalgia, IBS, and chronic abdominal pain, presenting with worsening abdominal pain. She describes a 5-day duration of worsening abdominal pain, suprapubic and periumbilical in location, cramping in quality, worse with movement, no relieving factors. She had associated nausea and vomiting with nonbloody, nonbilious emesis, one episode in the last 2 to 3 days for about 3 total episodes. She also had associated constipation, last bowel movement about 4 days ago, and presented to the hospital originally 2 days prior to arrival. She had initial workup, which was essentially negative, and was discharged after receiving IV pain medication. She continued to have symptoms, thus subsequently came back to the hospital.   REVIEW OF SYSTEMS: CONSTITUTIONAL:  Denies fevers or chills. Positive for fatigue or weakness.  EYES:  Denies blurred vision, double vision, or eye pain.  EARS, NOSE, AND THROAT:  Denies tinnitus, ear pain, or hearing loss. RESPIRATORY:  Denies cough or shortness of breath.  CARDIOVASCULAR:  Denies chest pain, palpitations, or edema. GASTROINTESTINAL:  Positive for nausea, vomiting, abdominal pain, and constipation as stated above.  GENITOURINARY:  Denies dysuria or hematuria.  ENDOCRINE:  Denies nocturia or thyroid problems.  HEMATOLOGIC AND LYMPHATIC:  Denies easy bruising or bleeding.  SKIN:  Denies rash or lesions.  MUSCULOSKELETAL:  Denies pain in the neck, back, shoulders, knees, hips or arthritic symptoms.  NEUROLOGIC:  Denies paralysis or paresthesias.  PSYCHIATRIC:  Denies anxiety  or depressive symptoms.   Otherwise, full review of systems performed by me is negative.   PAST MEDICAL HISTORY:  Fibromyalgia, depression,  gastroesophageal reflux disease without esophagitis, history of chronic pain, IBS, essential hypertension, and hypothyroidism, unspecified.   SOCIAL HISTORY:  Denies alcohol, tobacco, or drug usage.   FAMILY HISTORY:  Denies any known cardiovascular or pulmonary disorders.   ALLERGIES:  AMOXICILLIN, CIPRO, ERYTHROMYCIN, SULFA DRUGS.   HOME MEDICATIONS:  Include aspirin 81 mg p.o. daily, hydromorphone 2 mg p.o. q. 4 hours as needed for pain, meloxicam 15 mg p.o. daily, oxycodone 10 mg p.o. q. 6 hours as needed for pain, oxycodone 5 mg p.o. q. 6 hours as needed for pain, mexiletine 150 mg p.o. q. 8 hours, Lyrica 150 mg p.o. 3 times daily, amitriptyline 50 mg 2 tabs p.o. at bedtime, venlafaxine 37.5 mg 2 capsules p.o. at bedtime, Zofran 4 mg p.o. q. 8 hours as needed for nausea and vomiting, hydroxyzine 50 mg p.o. at bedtime, clotrimazole 1% topical cream to affected area b.i.d., Lidoderm 5% topical to affected area once daily, hydrochlorothiazide 20 mg p.o. daily, hyoscyamine 0.125 mg p.o. q. 6 hours as needed for abdominal cramping, Elmiron 100 mg p.o. 3 times weekly, Prilosec 20 mg p.o. daily, levothyroxine 175 mcg p.o. daily, Myrbetriq 25 mg p.o. daily, oxybutynin 5 mg p.o. daily.   PHYSICAL EXAMINATION: VITAL SIGNS:  Temperature 98.2, heart rate 83, respirations 19, blood pressure 102/64 and on arrival 154/96, saturating 100% on room air. Weight 115.7 kg, BMI 45.2.  GENERAL:  Obese, African-American female, currently in no acute distress.  HEAD:  Normocephalic, atraumatic.  EYES:  Pupils are equal, round, and reactive to light. Extraocular muscles  are intact. No scleral icterus.  MOUTH:  Moist mucous membranes. Dentition is intact. No abscess noted.  EARS, NOSE, AND THROAT:  Clear without exudates. No external lesions.  NECK:  Supple. No thyromegaly. No  nodules. No JVD.  PULMONARY:  Clear to auscultation bilaterally without wheezes, rales, or rhonchi. No use of accessory muscles. Good respiratory effort.  CHEST:  Nontender to palpation.  CARDIOVASCULAR:  S1 and S2, regular rate and rhythm. No murmurs, rubs, or gallops. No edema. Pedal pulses 2+ bilaterally.  GASTROINTESTINAL:  Soft, obese. Distractible tenderness on palpation in the suprapubic region, periumbilical, without guarding or motion tenderness. No rebound. Hypoactive bowel sounds. No hepatosplenomegaly noted.  MUSCULOSKELETAL:  No swelling, clubbing, or edema. Range of motion is full in all extremities.  NEUROLOGIC:  Cranial nerves II through XII are intact. No gross focal neurological deficits. Sensation is intact. Reflexes are intact.  SKIN:  No ulceration, lesions, rash, or cyanosis. Skin is warm and dry. Turgor is intact.  PSYCHIATRIC:  Mood and affect are within normal limits. The patient is awake and oriented x 3. Insight and judgment are intact.   LABORATORY DATA:  Sodium is 143, potassium 3.9, chloride 107, bicarbonate 31, BUN 13, creatinine 1.23, glucose 100. LFTs are within normal limits. WBC is 8.3, chloride 12.6, platelets of 297,000. Urinalysis is within normal limits. She had a CAT scan performed on January 25 and had no acute findings at that time.  ASSESSMENT AND PLAN:  A 54 year old African-American female with a history of chronic pain, fibromyalgia, and chronic abdominal pain, presenting with worsening abdominal pain.  1.  Lower abdominal pain with associated ileus. We will increase bowel regimen, as she does have constipation, and minimize pain medications. We will place her on a full-liquid diet and advance as tolerated and consult gastroenterology, as she has previously followed with Dr. Marva PandaSkulskie.  2.  Hypothyroidism, unspecified. Synthroid.  3.  Gastroesophageal reflux disease without esophagitis. Proton pump inhibitor therapy. 4.  Venous thromboembolism prophylaxis  with heparin subcutaneously.   CODE STATUS:  The patient is a full code.  TIME SPENT:  45 minutes.    ____________________________ Cletis Athensavid K. Eddrick Dilone, MD dkh:nb D: 06/11/2014 00:23:14 ET T: 06/11/2014 01:14:26 ET JOB#: [id number]  cc: Cletis Athensavid K. Aubryanna Nesheim, MD, <Dictator> Hayla Hinger Synetta ShadowK Roshanda Balazs MD ELECTRONICALLY SIGNED 06/11/2014 2:24

## 2014-09-13 NOTE — Consult Note (Signed)
Brief Consult Note: Diagnosis: Acute on chronic abdominal pain, constipation.   Patient was seen by consultant.   Comments: Ms. Joanna James is a morbidly obese 54 y/o female with acute on chronic functional abdominal pain, chronic IBS-constipation, & acute abdominal wall strain.    Plan: 1) Begin miralax purge 2) Dulcolax suppository now 3) She should go home on Linzess 290mcg daily for IBS 4) Miralax 17 grams PRN constipation at home 5) We discussed minimizing her narcotics 6) We discussed outpatient physical therapy for abdominal core muscle strengthening & weight loss 7) Follow up with [facility] gastroenterologist outpatient 8) Hold hyoscyamine while constipated Thanks for allowing us to participate in her care.  Please see full dictated note. #[id number].  Electronic Signatures: Joselyn ArrowJones, Juliannah Ohmann L (NP)  (Signed 28-Jan-16 16:24)  Authored: Brief Consult Note   Last Updated: 28-Jan-16 16:24 by Joselyn ArrowJones, Jenne Sellinger L (NP)

## 2014-09-13 NOTE — Consult Note (Signed)
Chief Complaint:  Subjective/Chief Complaint Pt noncompliant with miralax purge yesterday but has bottle at bedside today.  No results yet.  Agree with enema today.  Geberalized abd pain 8/10.   VITAL SIGNS/ANCILLARY NOTES: **Vital Signs.:   29-Jan-16 08:27  Vital Signs Type Q 8hr  Temperature Temperature (F) 98  Celsius 36.6  Temperature Source oral  Pulse Pulse 78  Respirations Respirations 18  Systolic BP Systolic BP 326  Diastolic BP (mmHg) Diastolic BP (mmHg) 78  Mean BP 90  Pulse Ox % Pulse Ox % 95  Pulse Ox Activity Level  At rest  Oxygen Delivery Room Air/ 21 %   Brief Assessment:  GEN no acute distress, obese, A/Ox3   Cardiac Regular   Respiratory normal resp effort   Gastrointestinal Normal   Gastrointestinal details normal Soft  Bowel sounds normal  No rebound tenderness  No gaurding  +Carnett sign   EXTR negative cyanosis/clubbing, negative edema   Additional Physical Exam Skin: warm, dry, no rash   Lab Results: Hepatic:  29-Jan-16 05:17   Bilirubin, Total 0.3  Alkaline Phosphatase 74  SGPT (ALT) 16  SGOT (AST) 20  Total Protein, Serum 6.7  Albumin, Serum  3.1  Routine Chem:  29-Jan-16 05:17   Glucose, Serum 99  Sodium, Serum 141  Chloride, Serum  108  CO2, Serum 30  Calcium (Total), Serum 8.6  Anion Gap  3  BUN  19  Creatinine (comp)  1.33  Potassium, Serum 4.2  Osmolality (calc) 284  eGFR (African American)  54  eGFR (Non-African American)  44 (eGFR values <19mL/min/1.73 m2 may be an indication of chronic kidney disease (CKD). Calculated eGFR, using the MRDR Study equation, is useful in  patients with stable renal function. The eGFR calculation will not be reliable in acutely ill patients when serum creatinine is changing rapidly. It is not useful in patients on dialysis. The eGFR calculation may not be applicable to patients at the low and high extremes of body sizes, pregnant women, and vegetarians.)    11:41   Glucose, Serum  115   Sodium, Serum 141  Chloride, Serum  111  CO2, Serum 22  Calcium (Total), Serum 8.8  Anion Gap 8 (Result(s) reported on 12 Jun 2014 at 12:48PM.)  Routine Hem:  29-Jan-16 05:17   WBC (CBC) 6.2  RBC (CBC) 4.26  Hemoglobin (CBC)  11.7  Hematocrit (CBC) 37.9  Platelet Count (CBC) 285  MCV 89  MCH 27.6  MCHC  31.0  RDW 13.9  Neutrophil % 53.3  Lymphocyte % 36.5  Monocyte % 6.5  Eosinophil % 2.8  Basophil % 0.9  Neutrophil # 3.3  Lymphocyte # 2.3  Monocyte # 0.4  Eosinophil # 0.2  Basophil # 0.1 (Result(s) reported on 12 Jun 2014 at [facility].)   Assessment/Plan:  Assessment/Plan:  Assessment Acute on chronic functional abdominal pain:  Pt's noncompliance significantly impacting her care.  Hx chronic IBS-constipation Acute abdominal wall strain: Stable   Plan 1) complete miralax purge 2) enemas today until results 3) Hopeful DC home soon with FU at [place] covering for [place] over weekend, please call if you have any questions or concerns   Electronic Signatures: Andria Meuse (NP)  (Signed 29-Jan-16 12:51)  Authored: Chief Complaint, VITAL SIGNS/ANCILLARY NOTES, Brief Assessment, Lab Results, Assessment/Plan   Last Updated: 29-Jan-16 12:51 by Andria Meuse (NP)

## 2014-09-13 NOTE — Consult Note (Signed)
PATIENT NAME:  Joanna James, Yeva P MR#:  [id number] DATE OF BIRTH:  [birth year]  DATE OF CONSULTATION:  06/14/2014  REFERRING PHYSICIAN:   CONSULTING PHYSICIAN:  Pauletta BrownsYuriy Geneva Pallas, MD  REASON FOR CONSULTATION: Tremor and stuttering speech.  HISTORY OF PRESENT ILLNESS: A 54 year old Caucasian female with past medical history of fibromyalgia, IBS, chronic abdominal pain, presenting with worsening abdominal pain The patient was being discharged. The patient states for 2 days she had sudden onset of stuttering speech after she washed her face and tremor in the upper extremities. When examining, the patient's stuttering speech waxes and wanes and is completely distractible. The had negative imaging, including CAT scan of the brain, negative MRI.  REVIEW OF SYSTEMS: Stuttering speech. Positive anxiety. Positive depression. No heat or cold intolerance.   IMAGING: As described above.   NEUROLOGICAL Evaluation: Awake, alert and oriented to time, place and location. Stuttering speech is completely distractible when asked questions about her diet, where she lives, and her child. Tremor in the upper extremities is distractible as well. Nonrhythmic, different amplitude and frequency. Left lower extremity weakness is chronic, per patient. Gait not assessed.   IMPRESSION: A 54 year old female who comes in with abdominal pain suspected due to irritable bowel syndrome. CT abdomen, pelvis and KUB unremarkable. Sudden onset of stuttering speech and tremor in the upper extremities. The tremor in upper extremities and stuttering speech are completely distractible. They are not rhythmic, change amplitude and frequency. Likely psychogenic. CT and MRI reviewed. From a neurological standpoint, I do not think there is any need for further workup. Likely psychiatric comorbidities contribute to that tremor and the stuttering speech, as again it is completely distractible. No further intervention. Please call with any questions.     ____________________________ Pauletta BrownsYuriy Lucrecia Mcphearson, MD yz:TT D: 06/14/2014 14:15:39 ET T: 06/14/2014 16:38:44 ET JOB#: [id number]  cc: Pauletta BrownsYuriy Daylen Lipsky, MD, <Dictator> Pauletta BrownsYURIY Briellah Baik MD ELECTRONICALLY SIGNED 06/23/2014 12:08

## 2014-09-13 NOTE — Consult Note (Signed)
Chief Complaint:  Subjective/Chief Complaint Pt states abdominal pain a little better.  1 BM in past 24 hrs.  Denies nausea & vomiting.   VITAL SIGNS/ANCILLARY NOTES: **Vital Signs.:   02-Feb-16 08:32  Vital Signs Type Q 8hr  Temperature Temperature (F) 98.5  Celsius 36.9  Temperature Source oral  Pulse Pulse 97  Respirations Respirations 18  Systolic BP Systolic BP 665  Diastolic BP (mmHg) Diastolic BP (mmHg) 65  Mean BP 83  Pulse Ox % Pulse Ox % 93  Pulse Ox Activity Level  At rest  Oxygen Delivery Room Air/ 21 %   Brief Assessment:  GEN no acute distress, obese, A/Ox3, daughter on facetime   Cardiac Regular   Respiratory normal resp effort   Gastrointestinal Normal   Gastrointestinal details normal Soft  Bowel sounds normal  No rebound tenderness  No gaurding  +TTP entire abdomen& pelvis +Carnett sign   EXTR negative cyanosis/clubbing, negative edema   Additional Physical Exam Skin: warm, dry, no rash neuro: pt stuttering, speech slurred   Lab Results: Hepatic:  02-Feb-16 04:56   Bilirubin, Total 0.4  Alkaline Phosphatase 75  SGPT (ALT) 15  SGOT (AST)  12  Total Protein, Serum 6.8  Albumin, Serum  3.2  Routine Chem:  02-Feb-16 04:56   BUN 14  Creatinine (comp)  1.48  Sodium, Serum 137  Potassium, Serum 3.8  Chloride, Serum 101  CO2, Serum 31  Calcium (Total), Serum 8.8  Osmolality (calc) 275  eGFR (African American)  47  eGFR (Non-African American)  39 (eGFR values <37m/min/1.73 m2 may be an indication of chronic kidney disease (CKD). Calculated eGFR, using the MRDR Study equation, is useful in  patients with stable renal function. The eGFR calculation will not be reliable in acutely ill patients when serum creatinine is changing rapidly. It is not useful in patients on dialysis. The eGFR calculation may not be applicable to patients at the low and high extremes of body sizes, pregnant women, and vegetarians.)  Anion Gap  5  Routine Hem:   02-Feb-16 04:56   WBC (CBC) 9.9  RBC (CBC) 3.97  Hemoglobin (CBC)  11.2  Hematocrit (CBC) 35.6  Platelet Count (CBC) 272  MCV 90  MCH 28.3  MCHC  31.5  RDW  14.6  Neutrophil % 64.9  Lymphocyte % 25.5  Monocyte % 4.7  Eosinophil % 3.9  Basophil % 1.0  Neutrophil # 6.4  Lymphocyte # 2.5  Monocyte # 0.5  Eosinophil # 0.4  Basophil # 0.1 (Result(s) reported on 16 Jun 2014 at 05:31AM.)   Assessment/Plan:  Assessment/Plan:  Assessment Acute on chronic functional abdominal pain:  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  She [facility].  She can follow up with them outpatient to consider further etiology of above finding including underlying malignancy, chronic ischemia, or other underlying cause.  I have discussed her care with Dr DLucilla Lame& Dr SVerdell Carmine  Patient's GI symptoms likely a combination of chronic IBS-constipation, functional abdominal pain, abdominal wall pain with hx endometriosis & interstitial cystitis.  Awaiting urinary PBGs/ALA to look for AIP.   Plan 1) Follow up urine PBGs/ALA 2) continue supportive measures 3)  continue daily miralax 4) I will schedule outpatient follow up as soon as possible with Dr PDossie Der& Dr WRedmond PullingPlease call if you have  any questions or concerns   Electronic Signatures: ,  L (NP)  (Signed 02-Feb-16 09:13)  Authored: Chief Complaint, VITAL SIGNS/ANCILLARY NOTES, Brief Assessment, Lab Results, Assessment/Plan   Last Updated: 02-Feb-16 09:13 by ,  L (NP) 

## 2014-09-13 NOTE — Consult Note (Signed)
Chief Complaint:  Subjective/Chief Complaint Pt continues to have generalized abd pain 7/10.  She had 3 BMs over weekend.  c/o nausea without vomiting.   VITAL SIGNS/ANCILLARY NOTES: **Vital Signs.:   01-Feb-16 15:58  Vital Signs Type Q 8hr  Temperature Temperature (F) 98.3  Celsius 36.8  Temperature Source oral  Pulse Pulse 96  Respirations Respirations 18  Systolic BP Systolic BP 136  Diastolic BP (mmHg) Diastolic BP (mmHg) 81  Mean BP 99  Pulse Ox % Pulse Ox % 92  Pulse Ox Activity Level  At rest  Oxygen Delivery Room Air/ 21 %  *Intake and Output.:   Shift 01-Feb-16 15:00  Grand Totals Intake:  480 Output:      Net:  480 24 Hr.:  480  Oral Intake      In:  480  Length of Stay Totals Intake:  9267 Output:  3525    Net:  5742   Brief Assessment:  GEN no acute distress, obese, A/Ox3, daughter at bedside   Cardiac Regular   Respiratory normal resp effort   Gastrointestinal Normal   Gastrointestinal details normal Soft  Bowel sounds normal  No rebound tenderness  No gaurding  +TTP entire abdomen& pelvis +Carnett sign   EXTR negative cyanosis/clubbing, negative edema   Additional Physical Exam Skin: warm, dry, no rash neuro: pt stuttering, speech slurred   Assessment/Plan:  Assessment/Plan:  Assessment Acute on chronic functional abdominal pain:  Pt's pain not improved with relief of constipation.  Hx chronic IBS-constipation.  Will order CTA to look for ischemia & Urinary PBGs/ALA to look for AIP Acute abdominal wall strain: Stable   Plan 1) Stat CTA ABD/Pelvis 2) Urine PBGs/ALA 3) continue supportive measures 4) Add warm compresses QID 5) Continue daily miralax 6) CBC, CMP, CRP in AM Please call if you have any questions or concerns   Electronic Signatures: Joselyn ArrowJones, Alexis Reber L (NP)  (Signed 01-Feb-16 16:45)  Authored: Chief Complaint, VITAL SIGNS/ANCILLARY NOTES, Brief Assessment, Assessment/Plan   Last Updated: 01-Feb-16 16:45 by Joselyn ArrowJones, Garrett Bowring L (NP)

## 2014-09-13 NOTE — Consult Note (Signed)
PATIENT NAME:  Joanna James, Joanna James MR#:  [id number] DATE OF BIRTH:  [birth year]  DATE OF CONSULTATION:  06/11/2014  PRIMARY CARE PHYSICIAN: [facility].   CONSULTING PHYSICIAN:  Joselyn ArrowKandice L. Dinnis Rog, NP  PRIMARY GASTROENTEROLOGIST[facility]  [facility]  PREVIOUS GASTROENTEROLOGIST:  Barnetta ChapelMartin Skulskie, MD  CONSULTING GASTROENTEROLOGIST:  Midge Miniumarren Wohl, MD   REQUESTING PHYSICIAN: Angelica Ranavid Hower, MD.   REASON FOR CONSULTATION: Abdominal pain.   HISTORY OF PRESENT ILLNESS: Ms. Joanna James is a 54 year old black female who has a history of chronic functional abdominal pain, irritable bowel syndrome, GERD, fibromyalgia, interstitial cystitis, endometriosis, who presents with worsening lower abdominal pain.  She has been evaluated [facility]va [facility]Skulskie[facility], and most recently by Dr. Andrey CampanileWilson.  She tells me about 1 month ago her bilateral lower quadrant abdominal pain began to worsen.  She has chronic daily pain; however, it is 10/10 now and constant. It is worse when she moves, pain radiates to her back.  She also has chronic back pain.  She is on oxycodone and Dilaudid and meloxicam, as well as amitriptyline and Lyrica at home. She takes occasional Zofran as needed.  She occasionally has loose stools, but in the last 6 days, she has not had a bowel movement.  When she does have a bowel movement, she has significant pain with her bowel movements. She takes Levsin as needed, but this more recently has tended to cause constipation for her. She has not tried Linzess or Amitiza.  She has tried MiraLax as needed, which seems to help a little, but not completely resolve her constipation.  She has a history of erosive esophagitis and she is not taking a PPI on a daily basis but she does take omeprazole over-the-counter when she feels like she needs it.  [facility]  She has an appointment for a biofeedback in February.   PAST  MEDICAL AND SURGICAL HISTORY: Fibromyalgia, irritable bowel, chronic functional abdominal pain. She tells me [facility] 2 [facility], and she believes she had a recent virtual colonoscopy which was normal except for diverticulosis.  In July 2013, Dr. Marva PandaSkulskie did an EGD and she was intolerant of intubation, so this was aborted and upper gastrointestinal series was negative.  She also had a negative HIDA in 2013.  In September 2012 she had an EGD by Dr. Marva PandaSkulskie that showed hiatal hernia, Schatzki's ring, gastritis, and nonobstructive peptic ulcer disease.  In April 2012, Dr. 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  Therefore, she tells me she had a virtual colonoscopy which was normal.  She has had hypothyroidism, obesity, diverticulosis, kidney stones, degenerative disk disease, endometriosis, appendectomy, oophorectomy, partial thyroidectomy for goiter, and lumbar spondylosis.   MEDICATIONS PRIOR TO ADMISSION: Aspirin 81 mg daily, hydromorphone 2 mg every 4 hours James.r.n., meloxicam 50 mg daily, oxycodone 10 mg every 6 hours James.r.n., oxycodone 5 mg every 6 hours James.r.n., Lyrica 150 mg every 8 hours, Lyrica 150 mg t.i.d., amitriptyline 50 mg 2 tablets at bedtime, venlafaxine 37.5 mg 2 capsules at bedtime, Zofran 4 mg every 8 hours as needed for nausea and vomiting, hydroxyzine 50 mg at bedtime, Clotrimazole 1% topical cream to affected area b.i.d., Lidoderm 5% topical to affected area once daily, hydrochlorothiazide 20 mg daily, hyoscyamine  0.125 mg every 6 hours James.r.n., Elmiron 100 mg t.i.d., Prilosec 20 mg daily, levothyroxine 175 mcg daily, Myrbetriq 25 mg daily, oxybutynin 5 mg daily.   ALLERGIES: AMOXICILLIN, CIPRO, ERYTHROMYCIN AND SULFA.   FAMILY HISTORY: Her father has history of bladder cancer, paternal uncle had colon cancer in his 88s.  Otherwise,  negative for first-degree relatives with colorectal carcinoma or chronic GI problems.   SOCIAL HISTORY: She occasionally has an alcoholic beverage every couple of months.  She denies any illicit drug use or tobacco use.  She is disabled. She has 2 grown healthy children. She is divorced.  She lives with her parents.   REVIEW OF SYSTEMS:  See history of present illness.  MUSCULOSKELETAL: She has chronic joint, neck, back pain and body aches. Otherwise, a negative 12 point review of systems.   PHYSICAL EXAMINATION:   VITAL SIGNS:   BMI is 45.1, height 62.9 inches, weight 254.6 pounds, temperature 97.8, pulse 81, respirations 18, blood pressure 116/78, oxygen saturation 94% on 2 liters via nasal cannula.  GENERAL: She is a morbidly obese female who is alert, oriented, pleasant, and cooperative, in no acute distress.  HEENT: Sclerae clear, nonicteric.  Conjunctivae pink.  Oropharynx pink and moist without any lesions. RN Clydie Braun is at the bedside.  NECK: Supple without mass or thyromegaly.  CHEST: Heart regular rhythm. Normal S1, S2, without any murmurs, clicks, rubs, or gallops.  LUNGS: Clear to auscultation bilaterally.  ABDOMEN: Protuberant. She has positive bowel sounds x 4. No bruits auscultated. Abdomen is soft, nondistended. Multiple striae.  She has a positive Carnett's sign for abdominal wall tenderness in her lower abdomen and I am able reproduce her pain.  There is no rebound, tenderness or guarding. No hepatosplenomegaly or mass, although exam is significantly limited given body habitus.  EXTREMITIES: Without clubbing or edema bilaterally.  SKIN: Warm and dry without any rash or jaundice.  NEUROLOGIC: Grossly intact.  MUSCULOSKELETAL: Good equal movement and strength bilaterally.  PSYCHIATRIC: Alert, cooperative. She is tearful at times throughout the exam and has a flat affect.   LABORATORY STUDIES: Glucose 100, hemoglobin A1c 5.5, otherwise normal complete metabolic panel.  Lipase 81,  TSH 6.85.  CBC normal. Urinalysis is negative.   IMAGING: She had a CT of abdomen and pelvis with contrast on 06/08/2014.  She was found to have 2 nonobstructing calculus in the left renal collecting system and mild colonic diverticulosis. Nothing to explain chronic or acute abdominal pain.   IMPRESSION: Ms. Koike is a morbidly obese 54 year old female with acute on chronic functional abdominal pain, chronic irritable bowel syndrome with constipation and acute abdominal wall strain.   PLAN:  1.  Begin MiraLax purge today.  2.  Dulcolax suppository now.  3.  She should go home on Linzess 290 mcg daily for irritable bowel syndrome.  4.  MiraLax 17 grams as needed for constipation at home.  5.  We discussed minimizing her narcotics.  6.  We discussed outpatient physical therapy for abdominal cor muscle strengthening and weight loss.  7.  Follow up with [facility] outpatient.  8.  Hold hyoscyamine while constipated.   Thank you for allowing [place] to participate in her care.    ____________________________ Joselyn Arrow, NP klj:DT D: 06/11/2014 16:24:10 ET T: 06/11/2014 17:31:29 ET JOB#: [id number]  cc: Joselyn Arrow, NP, <Dictator> [facility]   Joselyn Arrow FNP ELECTRONICALLY SIGNED 06/13/2014 5:25

## 2014-09-13 NOTE — Consult Note (Signed)
PATIENT NAME:  Joanna James, Joanna James MR#:  [id number] DATE OF BIRTH:  04-Apr-1961  DATE OF CONSULTATION:  06/13/2014  CONSULTING PHYSICIAN:  Churchill Grimsley K. Guss Bundehalla, MD  PLACE OF DICTATION: [facility], room #227, [place].  SEX: Female.  RACE: African American.  AGE: 54 years.  SUBJECTIVE: The patient was [facility] in [facility]. 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 The patient is currently enrolled in [address] [employer] online based in [place]. She is doing her PhD in addiction and psychology. The patient divorced for many years and currently lives with her parents that are in their late 4870s, along with her 2 daughters that are in their 7120s and 30s, along with her 3 grandchildren that are 688, 326, and 55 years old. All 8 of them live in a house. The patient was seen with a very close friend of hers of many years.   CHIEF COMPLAINT: "I can't speak, my speech is not clear. I cannot use my hands and I need assistance to walk."  HISTORY OF PRESENT ILLNESS: The patient reports that she was okay when she came to the hospital, and she came to the hospital for abdominal pain and nausea, vomiting. According to the information obtained from the floor physician, the patient was checked out for this with various tests and they were all negative, and it was a concern if this problem could have any mental health basis or psychiatric basis.   PAST PSYCHIATRIC HISTORY: No previous history of inpatient on psychiatry. No history of suicide attempts. Not being followed by any psychiatrist. No major mental issues.   ALCOHOL AND DRUGS: Denies drinking alcohol. Denies history of recreational drug abuse. Denies smoking nicotine cigarettes.   According to information obtained from her good friend of many years, the patient has never been like this. She is highly productive and currently enrolled in PhD program and takes care  of her 3 grandchildren and helps her 2 daughters around the house.   MENTAL STATUS: The patient was seen lying in bed, appears comfortable. Speech appears to be fairly clear and understandable. She is alert and oriented. She knew the reason that she came. She knew the place, person, and time. She knew the capital [place], the capital of the [place], the name of the current Economistresident. Memory and recall are good. General knowledge information is fair. Denies feeling depressed. Denies feeling hopeless, helpless. Denies feeling worthless or useless. Denies any suicidal or homicidal plans. In fact, she replied "I am not homicidal. I'm not suicidal. I'm having speech problems, and I need assistance." No psychosis. Does not appear to be responding to internal stimuli. Denies auditory or visual hallucinations, delusional, or paranoid thinking. She could count money and regarding judgement for fire she said "she would look at exit and leave." Denies any sleep or appetite disturbance. Insight and judgement are guarded. Impulse control is fair.   IMPRESSION: Rule out conversion disorder.   PLAN: Recommend the patient probably needs to be tested for her speech, as her good friend of many years states that she has never been like this before, and conversion disorder needs to be ruled out and helped as needed. Reconsult after all the tests are negative. It is doubtful if the patient will benefit from inpatient hospital and psychiatry and if it is conversion disorder, we have to let it slowly resolve and give supportive therapy and help as  needed.    ____________________________ Jannet Mantis. Guss Bunde, MD skc:mw D: 06/13/2014 12:25:00 ET T: 06/13/2014 15:26:42 ET JOB#: [id number]  cc: Monika Salk K. Guss Bunde, MD, <Dictator> Beau Fanny MD ELECTRONICALLY SIGNED 06/14/2014 14:12

## 2014-09-13 NOTE — Consult Note (Signed)
PATIENT NAME:  Joanna James, Joanna James MR#:  [id number] DATE OF BIRTH:  1960/09/21  DATE OF CONSULTATION:  06/15/2014  CONSULTING PHYSICIAN:  Audery Amel, MD  IDENTIFYING INFORMATION AND REASON FOR CONSULTATION:  A 54 year old woman currently in the hospital because of abdominal pain with new presentation over the weekend of atypical neurologic symptoms. Consult for possibility of conversion disorder.   HISTORY OF PRESENT ILLNESS:  Information obtained from the patient and the chart and discussion with current hospitalist. The patient came in to the hospital on January 28 for abdominal pain. This past Friday, the treatment team was planning to discharge her from the hospital when she announced new sudden onset of different symptoms. She tells me that she had gotten up to go to the bathroom, and on her way back, she suddenly and instantly developed a difficulty with her speech as well as a tremor and weakness in her legs. She reports that somehow she was able to manage to sit down rather than fall down and that since then the symptoms have persisted. Because of the new sudden onset symptoms, the discharge plan was delayed. The patient has been evaluated by a neurology consultant, who did not feel that further workup was indicated at this point and did not feel that the symptoms were likely to be due to a neurologic lesion. Dr. Guss Bunde from psychiatry saw the patient over the weekend and was of the opinion also that the symptoms were very likely psychogenic, but did not recommend a specific psychiatric treatment. On my interview today, the patient was initially irritated and hesitant to cooperate, saying that she knows she is not crazy and does not know why anyone would think otherwise. She did eventually cooperate with the interview. She reports chronic abdominal pain with intermittent worsening. Recently, she had worsening of her abdominal pain that brought her in to the hospital. The patient has no particular  understanding of why she might have developed these new neurologic symptoms. She speculates that tremors run in her family and perhaps that is what it is. She tells me that she absolutely denies any symptoms of depression. Her mood feels fine. She totally denies any sense of anxiety whatsoever. She says that she sleeps adequately with current medicine. Denies any psychotic symptoms. Denies suicidal or homicidal ideation. She does tell me that she feels like the tremor needs to be fixed before she can be discharged from the hospital.   PAST PSYCHIATRIC HISTORY:  Evidently, there is no known past psychiatric history or treatment for this patient. No history of psychiatric hospitalization. No history of suicide attempts.   The patient is on some medications that can be used in treating depression, but in her case are evidently part of her pain (Dictation Anomaly) << MISSING TEXT>>.   PAST MEDICAL HISTORY:  Long history of chronic pain going back to childhood. It turned into endometriosis when she began having menstrual periods. (Dictation Anomaly) <<sheMISSING TEXT>> has had multiple hospitalizations spread out over the years. Interestingly, none of them seemed to have resulted in a very clearcut diagnosis, although she reports that she has had several surgeries over the years for it.   FAMILY HISTORY:  Denies any family history of mental illness.   SOCIAL HISTORY:  The patient lives with extended family including her children, her mother, and her grandchildren. She is disabled. She takes classes online.   SUBSTANCE ABUSE HISTORY:  She totally denies the use of alcohol and denies any history of abuse of opiate drugs  or any other substance abuse.   REVIEW OF SYSTEMS:  She states that she is having tremor that has caused her to knock food off of her plate. It is present in her hands. She also has a stuttering to her (Dictation Anomaly) << MISSING TEXT>> and also severe abdominal pain. Denies any psychiatric  symptoms.   MENTAL STATUS EXAMINATION:  The patient is a neatly groomed woman who looks her stated age and cooperative with the interview. Eye contact is good. Psychomotor activity is marked by a rather odd tremor. At times, when she is describing it, it will be worse. There are other long stretches in the interview in which is not noticeable. During the time we were talking, she was able to eat a bowl of semiliquid food without spilling a single drop. This was despite having this little vibrating tremor in her hands. Speech is notable for also an odd stutter. She can pronounce several long and complicated words perfectly. At other times, she will seem to be accentuating the stutter. Nothing about it got in the way of communication. She was able to form all of her sounds appropriately. Her stutter was not typical of a person who is stammering, but rather would consist of repeating 2 or 3 times every syllable in a word very quickly, but still talking at a normal rate. Affect is a little flattened and mood stated as fine. Thoughts are lucid. No evidence of delusions or loosening of associations. Denies suicidal or homicidal ideation. Could repeat words immediately and remembered 3 at 3 minutes. Intelligence is normal. Alert and oriented x 4. Judgment and insight are questionable.   CURRENT MEDICATIONS:  Long list:  Amitriptyline 100 mg at night, aspirin 81 mg per day, Synthroid 175 mcg per day, Mexitil 150 mg q. 8 hours, mirabegron extended release 25 mg daily, oxybutynin extended release 5 mg daily, pantoprazole 40 mg daily, Lyrica 150 mg 3 times a day, venlafaxine extended release 75 mg at night, bisacodyl 10 mg suppository at night, Dilaudid oral p.r.n. for pain, Roxicodone 10 mg q. 6 p.r.n. for pain, docusate 100 mg twice a day, MiraLax 17 grams twice a day.   LABORATORY DATA:  An MRI of the brain was performed on the 30th, which was completely normal. Head CT was performed, which was completely normal. Free  thyroxine is currently normal. Elevated glucose most recently at 115 and creatinine elevated at 1.3. CBC was unremarkable. Lipase was normal.   VITAL SIGNS:  Blood pressure 136/81, respirations 18, pulse 96, temperature 98.3.   ASSESSMENT:  The patient is completely denying any psychiatric symptoms and is not showing obvious symptoms of depression, psychosis, or anxiety. As far as the current neurologic symptoms, I agree with the neurology consultant. These symptoms are extremely atypical and nonphysiologic. Their presentation during the interview is inconsistent. I do not know of any neurologic condition which would produce the constellation and type of symptoms that she is having. The MRI of the brain should have revealed any evidence of a rare or strange infarct, which it did not. At this time, it is impossible to rule in or out conversion or malingering, but it seems very likely that the symptoms are psychogenic. A generous interpretation would be that anxiety at being discharged from the hospital caused an unconscious production of the symptoms. A less generous interpretation would be that the patient's appreciation for hospitalization and narcotic pain medicine prompted the occurrence of the symptoms. In any case, at this point there is no  psychiatric treatment that would be likely to assist with this. The decision about further workup and disposition is up to the primary treatment team, but at this point, there seems to be no reason for continuing to try and work this up or do anything further about these symptoms.   DIAGNOSIS, PRINCIPAL AND PRIMARY:  AXIS I:  Somatization disorder, not otherwise specified; pain disorder with extreme emphasis on abdominal pain; rule out conversion disorder versus factitious disorder versus malingering; abdominal pain of unclear etiology; history of kidney stones.    ____________________________ Audery AmelJohn T. Tayon Parekh, MD jtc:nb D: 06/15/2014 22:08:58 ET T: 06/15/2014  22:25:45 ET JOB#: [id number]  cc: Audery AmelJohn T. Sheretha Shadd, MD, <Dictator> Audery AmelJOHN T Melinda Pottinger MD ELECTRONICALLY SIGNED 06/24/2014 17:24

## 2014-10-23 DIAGNOSIS — R600 Localized edema: Secondary | ICD-10-CM | POA: Insufficient documentation

## 2014-10-23 DIAGNOSIS — M25571 Pain in right ankle and joints of right foot: Secondary | ICD-10-CM | POA: Insufficient documentation

## 2014-11-11 DIAGNOSIS — M109 Gout, unspecified: Secondary | ICD-10-CM | POA: Insufficient documentation

## 2014-11-25 DIAGNOSIS — N183 Chronic kidney disease, stage 3 unspecified: Secondary | ICD-10-CM | POA: Insufficient documentation

## 2015-01-21 DIAGNOSIS — Y9289 Other specified places as the place of occurrence of the external cause: Secondary | ICD-10-CM | POA: Diagnosis not present

## 2015-01-21 DIAGNOSIS — W07XXXA Fall from chair, initial encounter: Secondary | ICD-10-CM | POA: Insufficient documentation

## 2015-01-21 DIAGNOSIS — S79922A Unspecified injury of left thigh, initial encounter: Secondary | ICD-10-CM | POA: Insufficient documentation

## 2015-01-21 DIAGNOSIS — Y998 Other external cause status: Secondary | ICD-10-CM | POA: Diagnosis not present

## 2015-01-21 DIAGNOSIS — Z880 Allergy status to penicillin: Secondary | ICD-10-CM | POA: Diagnosis not present

## 2015-01-21 DIAGNOSIS — S3992XA Unspecified injury of lower back, initial encounter: Secondary | ICD-10-CM | POA: Insufficient documentation

## 2015-01-21 DIAGNOSIS — Y93B2 Activity, push-ups, pull-ups, sit-ups: Secondary | ICD-10-CM | POA: Diagnosis not present

## 2015-01-21 DIAGNOSIS — I10 Essential (primary) hypertension: Secondary | ICD-10-CM | POA: Insufficient documentation

## 2015-01-21 DIAGNOSIS — S79912A Unspecified injury of left hip, initial encounter: Secondary | ICD-10-CM | POA: Diagnosis present

## 2015-01-22 ENCOUNTER — Emergency Department: Payer: Medicaid Other

## 2015-01-22 ENCOUNTER — Emergency Department
Admission: EM | Admit: 2015-01-22 | Discharge: 2015-01-22 | Disposition: A | Discharge status: Home / Self Care | Payer: Medicaid Other | Attending: Emergency Medicine | Admitting: Emergency Medicine

## 2015-01-22 ENCOUNTER — Encounter: Payer: Self-pay | Admitting: Emergency Medicine

## 2015-01-22 DIAGNOSIS — M25552 Pain in left hip: Secondary | ICD-10-CM

## 2015-01-22 HISTORY — DX: Essential (primary) hypertension: I10

## 2015-01-22 MED ORDER — IBUPROFEN 800 MG PO TABS: 800.0000 mg | ORAL_TABLET | Freq: Three times a day (TID) | ORAL | Status: DC | PRN

## 2015-01-22 NOTE — ED Notes (Signed)
Pt presents to ED with c/o left hip pain that radiates to the thigh and lower back pain. Pt states she was sitting in a chair around 2200 and the chair slid out from under her and she fell to the ground landing on her back. Pt states she is having difficulty bearing weight on the left leg when ambulating. Denies hitting her head or loc.

## 2015-01-22 NOTE — ED Provider Notes (Signed)
[facility] Emergency Department Provider Note   ____________________________________________  Time seen: 0410  I have reviewed the triage vital signs and the nursing notes.   HISTORY  Chief Complaint Fall; Back Pain; and Hip Pain   History limited by: Not Limited   HPI Joanna James is a 54 y.o. female who presents to the emergency department today with concerns for left hip pain. Patient states that 2 days ago she fell. She states that the chair went out from under her and she landed on her tailbone. She states that since then she has been having pain on the lateral aspect of her left hip. It does radiate down her left thigh. She states that it makes it hard for her to walk. She states she does have a history of sciatica.  Past Medical History  Diagnosis Date  . Hypertension     There are no active problems to display for this patient.   Past Surgical History  Procedure Laterality Date  . Abdominal hysterectomy      No current outpatient prescriptions on file.  Allergies Amoxicillin; Azithromycin; Ciprofloxacin; and Sulfa antibiotics  History reviewed. No pertinent family history.  Social History Social History  Substance Use Topics  . Smoking status: Never Smoker   . Smokeless tobacco: None  . Alcohol Use: Yes    Review of Systems  Constitutional: Negative for fever. Cardiovascular: Negative for chest pain. Respiratory: Negative for shortness of breath. Gastrointestinal: Negative for abdominal pain, vomiting and diarrhea. Genitourinary: Negative for dysuria. Musculoskeletal: Left hip pain Skin: Negative for rash. Neurological: Negative for headaches, focal weakness or numbness.   10-point ROS otherwise negative.  ____________________________________________   PHYSICAL EXAM:  VITAL SIGNS: ED Triage Vitals  Enc Vitals Group     BP 01/22/15 0015 129/78 mmHg     Pulse Rate 01/22/15 0015 81     Resp 01/22/15  0015 18     Temp 01/22/15 0015 98.1 F (36.7 C)     Temp Source 01/22/15 0015 Oral     SpO2 01/22/15 0015 97 %     Weight 01/22/15 0015 250 lb (113.399 kg)     Height 01/22/15 0015 5\' 4"  (1.626 m)     Head Cir --      Peak Flow --      Pain Score 01/22/15 0404 9   Constitutional: Alert and oriented. Well appearing and in no distress. Eyes: Conjunctivae are normal. PERRL. Normal extraocular movements. ENT   Head: Normocephalic and atraumatic.   Nose: No congestion/rhinnorhea.   Mouth/Throat: Mucous membranes are moist.   Neck: No stridor. Hematological/Lymphatic/Immunilogical: No cervical lymphadenopathy. Cardiovascular: Normal rate, regular rhythm.  No murmurs, rubs, or gallops. Respiratory: Normal respiratory effort without tachypnea nor retractions. Breath sounds are clear and equal bilaterally. No wheezes/rales/rhonchi. Gastrointestinal: Soft and nontender. No distention. Genitourinary: Deferred Musculoskeletal: No tenderness to palpation of the hip. No tenderness to external and internal rotation. Neurovascularly intact. Neurologic:  Normal speech and language. No gross focal neurologic deficits are appreciated. Speech is normal.  Skin:  Skin is warm, dry and intact. No rash noted. Psychiatric: Mood and affect are normal. Speech and behavior are normal. Patient exhibits appropriate insight and judgment.  ____________________________________________    LABS (pertinent positives/negatives)  None  ____________________________________________   EKG  None  ____________________________________________    RADIOLOGY  Pelvis IMPRESSION: Negative.  I, Yates Weisgerber, personally viewed and evaluated these images (plain radiographs) as part of my medical decision making.    ____________________________________________  PROCEDURES  Procedure(s) performed: None  Critical Care performed: No  ____________________________________________   INITIAL  IMPRESSION / ASSESSMENT AND PLAN / ED COURSE  Pertinent labs & imaging results that were available during my care of the patient were reviewed by me and considered in my medical decision making (see chart for details).  Patient presented to the emergency department with left hip pain after a fall from a chair. On exam no obvious deformity. No tenderness. Pelvic x-ray without any fracture. This point think patient might have an exacerbation of her sciatica versus possible bursitis. Will have patient take NSAIDs. Instructed and encouraged primary care follow-up.  ____________________________________________   FINAL CLINICAL IMPRESSION(S) / ED DIAGNOSES  Final diagnoses:  Hip pain, left     Phineas Semen, MD 01/22/15 [id number]

## 2015-01-22 NOTE — Discharge Instructions (Signed)
Please seek medical attention for any high fevers, chest pain, shortness of breath, change in behavior, persistent vomiting, bloody stool or any other new or concerning symptoms. °Arthritis, Nonspecific °Arthritis is inflammation of a joint. This usually means pain, redness, warmth or swelling are present. One or more joints may be involved. There are a number of types of arthritis. Your caregiver may not be able to tell what type of arthritis you have right away. °CAUSES  °The most common cause of arthritis is the wear and tear on the joint (osteoarthritis). This causes damage to the cartilage, which can break down over time. The knees, hips, back and neck are most often affected by this type of arthritis. °Other types of arthritis and common causes of joint pain include: °· Sprains and other injuries near the joint. Sometimes minor sprains and injuries cause pain and swelling that develop hours later. °· Rheumatoid arthritis. This affects hands, feet and knees. It usually affects both sides of your body at the same time. It is often associated with chronic ailments, fever, weight loss and general weakness. °· Crystal arthritis. Gout and pseudo gout can cause occasional acute severe pain, redness and swelling in the foot, ankle, or knee. °· Infectious arthritis. Bacteria can get into a joint through a break in overlying skin. This can cause infection of the joint. Bacteria and viruses can also spread through the blood and affect your joints. °· Drug, infectious and allergy reactions. Sometimes joints can become mildly painful and slightly swollen with these types of illnesses. °SYMPTOMS  °· Pain is the main symptom. °· Your joint or joints can also be red, swollen and warm or hot to the touch. °· You may have a fever with certain types of arthritis, or even feel overall ill. °· The joint with arthritis will hurt with movement. Stiffness is present with some types of arthritis. °DIAGNOSIS  °Your caregiver will  suspect arthritis based on your description of your symptoms and on your exam. Testing may be needed to find the type of arthritis: °· Blood and sometimes urine tests. °· X-ray tests and sometimes CT or MRI scans. °· Removal of fluid from the joint (arthrocentesis) is done to check for bacteria, crystals or other causes. Your caregiver (or a specialist) will numb the area over the joint with a local anesthetic, and use a needle to remove joint fluid for examination. This procedure is only minimally uncomfortable. °· Even with these tests, your caregiver may not be able to tell what kind of arthritis you have. Consultation with a specialist (rheumatologist) may be helpful. °TREATMENT  °Your caregiver will discuss with you treatment specific to your type of arthritis. If the specific type cannot be determined, then the following general recommendations may apply. °Treatment of severe joint pain includes: °· Rest. °· Elevation. °· Anti-inflammatory medication (for example, ibuprofen) may be prescribed. Avoiding activities that cause increased pain. °· Only take over-the-counter or prescription medicines for pain and discomfort as recommended by your caregiver. °· Cold packs over an inflamed joint may be used for 10 to 15 minutes every hour. Hot packs sometimes feel better, but do not use overnight. Do not use hot packs if you are diabetic without your caregiver's permission. °· A cortisone shot into arthritic joints may help reduce pain and swelling. °· Any acute arthritis that gets worse over the next 1 to 2 days needs to be looked at to be sure there is no joint infection. °Long-term arthritis treatment involves modifying activities and lifestyle   to reduce joint stress jarring. This can include weight loss. Also, exercise is needed to nourish the joint cartilage and remove waste. This helps keep the muscles around the joint strong. °HOME CARE INSTRUCTIONS  °· Do not take aspirin to relieve pain if gout is suspected.  This elevates uric acid levels. °· Only take over-the-counter or prescription medicines for pain, discomfort or fever as directed by your caregiver. °· Rest the joint as much as possible. °· If your joint is swollen, keep it elevated. °· Use crutches if the painful joint is in your leg. °· Drinking plenty of fluids may help for certain types of arthritis. °· Follow your caregiver's dietary instructions. °· Try low-impact exercise such as: °¨ Swimming. °¨ Water aerobics. °¨ Biking. °¨ Walking. °· Morning stiffness is often relieved by a warm shower. °· Put your joints through regular range-of-motion. °SEEK MEDICAL CARE IF:  °· You do not feel better in 24 hours or are getting worse. °· You have side effects to medications, or are not getting better with treatment. °SEEK IMMEDIATE MEDICAL CARE IF:  °· You have a fever. °· You develop severe joint pain, swelling or redness. °· Many joints are involved and become painful and swollen. °· There is severe back pain and/or leg weakness. °· You have loss of bowel or bladder control. °Document Released: 06/08/2004 Document Revised: 07/24/2011 Document Reviewed: 06/24/2008 °[employer]® Patient Information ©2015 [employer]. This information is not intended to replace advice given to you by your health care provider. Make sure you discuss any questions you have with your health care provider. ° °

## 2015-03-16 ENCOUNTER — Emergency Department
Admission: EM | Admit: 2015-03-16 | Discharge: 2015-03-16 | Disposition: A | Discharge status: Home / Self Care | Payer: Medicaid Other | Attending: Emergency Medicine | Admitting: Emergency Medicine

## 2015-03-16 ENCOUNTER — Encounter: Payer: Self-pay | Admitting: Emergency Medicine

## 2015-03-16 DIAGNOSIS — N12 Tubulo-interstitial nephritis, not specified as acute or chronic: Secondary | ICD-10-CM

## 2015-03-16 DIAGNOSIS — Z880 Allergy status to penicillin: Secondary | ICD-10-CM | POA: Diagnosis not present

## 2015-03-16 DIAGNOSIS — R350 Frequency of micturition: Secondary | ICD-10-CM | POA: Diagnosis present

## 2015-03-16 DIAGNOSIS — I10 Essential (primary) hypertension: Secondary | ICD-10-CM | POA: Diagnosis not present

## 2015-03-16 HISTORY — DX: Fibromyalgia: M79.7

## 2015-03-16 HISTORY — DX: Interstitial cystitis (chronic) without hematuria: N30.10

## 2015-03-16 HISTORY — DX: Gout, unspecified: M10.9

## 2015-03-16 LAB — URINALYSIS COMPLETE WITH MICROSCOPIC (ARMC ONLY)
Bilirubin Urine: NEGATIVE
Glucose, UA: 50 mg/dL — AB
Ketones, ur: NEGATIVE mg/dL
LEUKOCYTES UA: NEGATIVE
NITRITE: NEGATIVE
PH: 6 (ref 5.0–8.0)
PROTEIN: NEGATIVE mg/dL
SPECIFIC GRAVITY, URINE: 1.023 (ref 1.005–1.030)

## 2015-03-16 LAB — COMPREHENSIVE METABOLIC PANEL
ALK PHOS: 77 U/L (ref 38–126)
ALT: 15 U/L (ref 14–54)
ANION GAP: 8 (ref 5–15)
AST: 20 U/L (ref 15–41)
Albumin: 4.3 g/dL (ref 3.5–5.0)
BUN: 19 mg/dL (ref 6–20)
CHLORIDE: 105 mmol/L (ref 101–111)
CO2: 26 mmol/L (ref 22–32)
Calcium: 9.2 mg/dL (ref 8.9–10.3)
Creatinine, Ser: 1.29 mg/dL — ABNORMAL HIGH (ref 0.44–1.00)
GFR calc Af Amer: 53 mL/min — ABNORMAL LOW (ref >60)
GFR, EST NON AFRICAN AMERICAN: 46 mL/min — AB (ref >60)
GLUCOSE: 106 mg/dL — AB (ref 65–99)
POTASSIUM: 3.4 mmol/L — AB (ref 3.5–5.1)
Sodium: 139 mmol/L (ref 135–145)
TOTAL PROTEIN: 7.9 g/dL (ref 6.5–8.1)
Total Bilirubin: 0.5 mg/dL (ref 0.3–1.2)

## 2015-03-16 LAB — CBC
HCT: 41.1 % (ref 35.0–47.0)
HEMOGLOBIN: 13.3 g/dL (ref 12.0–16.0)
MCH: 28.9 pg (ref 26.0–34.0)
MCHC: 32.5 g/dL (ref 32.0–36.0)
MCV: 89 fL (ref 80.0–100.0)
Platelets: 295 10*3/uL (ref 150–440)
RBC: 4.61 MIL/uL (ref 3.80–5.20)
RDW: 14.1 % (ref 11.5–14.5)
WBC: 9.6 10*3/uL (ref 3.6–11.0)

## 2015-03-16 LAB — LIPASE, BLOOD: Lipase: 23 U/L (ref 11–51)

## 2015-03-16 MED ORDER — CEPHALEXIN 500 MG PO CAPS
500.0000 mg | ORAL_CAPSULE | Freq: Once | ORAL | Status: DC
  Filled 2015-03-16: qty 1

## 2015-03-16 MED ORDER — CEPHALEXIN 500 MG PO CAPS: 500.0000 mg | ORAL_CAPSULE | Freq: Three times a day (TID) | ORAL | Status: AC

## 2015-03-16 NOTE — ED Provider Notes (Addendum)
[facility] Emergency Department Provider Note  ____________________________________________  Time seen: Approximately 5 PM  I have reviewed the triage vital signs and the nursing notes.   HISTORY  Chief Complaint Abdominal Pain and Urinary Frequency    HPI Joanna James is a 54 y.o. female with a history of interstitial cystitis and urinary tract infections who is presenting today with 4 days of worsening burning with urination, suprapubic pain that radiates to her back. She denies any nausea vomiting or diarrhea. Says that she has had some chills in the evenings. She does take medicine for her interstitial cystitis at home which she is compliant with. [place]Denies any vaginal discharge or bleeding.  Past Medical History  Diagnosis Date  . Hypertension   . IC (interstitial cystitis)   . Fibromyalgia   . Gout     There are no active problems to display for this patient.   Past Surgical History  Procedure Laterality Date  . Abdominal hysterectomy      Current Outpatient Rx  Name  Route  Sig  Dispense  Refill  . ibuprofen (ADVIL,MOTRIN) 800 MG tablet   Oral   Take 1 tablet (800 mg total) by mouth every 8 (eight) hours as needed.   30 tablet   0     Allergies Amoxicillin; Azithromycin; Ciprofloxacin; and Sulfa antibiotics  History reviewed. No pertinent family history.  Social History Social History  Substance Use Topics  . Smoking status: Never Smoker   . Smokeless tobacco: None  . Alcohol Use: Yes    Review of Systems Constitutional: No fever/chills Eyes: No visual changes. ENT: No sore throat. Cardiovascular: Denies chest pain. Respiratory: Denies shortness of breath. Gastrointestinal: No nausea, no vomiting.  No diarrhea.  No constipation. Genitourinary: Negative for dysuria. Musculoskeletal: As above  Skin: Negative for rash. Neurological: Negative for headaches, focal  weakness or numbness.  10-point ROS otherwise negative.  ____________________________________________   PHYSICAL EXAM:  VITAL SIGNS: ED Triage Vitals  Enc Vitals Group     BP 03/16/15 1604 136/94 mmHg     Pulse Rate 03/16/15 1604 79     Resp 03/16/15 1604 20     Temp 03/16/15 1604 98.3 F (36.8 C)     Temp Source 03/16/15 1604 Oral     SpO2 03/16/15 1604 100 %     Weight 03/16/15 1604 260 lb (117.935 kg)     Height 03/16/15 1604 5\' 4"  (1.626 m)     Head Cir --      Peak Flow --      Pain Score 03/16/15 1605 10     Pain Loc --      Pain Edu? --      Excl. in GC? --     Constitutional: Alert and oriented. Well appearing and in no acute distress. Eyes: Conjunctivae are normal. PERRL. EOMI. Head: Atraumatic. Nose: No congestion/rhinnorhea. Mouth/Throat: Mucous membranes are moist.  Oropharynx non-erythematous. Neck: No stridor.   Cardiovascular: Normal rate, regular rhythm. Grossly normal heart sounds.  Good peripheral circulation. Respiratory: Normal respiratory effort.  No retractions. Lungs CTAB. Gastrointestinal: Soft with mild suprapubic tenderness to palpation. No distention. No abdominal bruits. Mild CVA tenderness bilaterally which the patient says is chronic and unchanged. Musculoskeletal: No lower extremity tenderness nor edema.  No joint effusions. Neurologic:  Normal speech and language. No gross focal neurologic deficits are appreciated. No gait instability. Skin:  Skin is warm, dry and intact. No rash noted. Psychiatric:  Mood and affect are normal. Speech and behavior are normal.  ____________________________________________   LABS (all labs ordered are listed, but only abnormal results are displayed)  Labs Reviewed  COMPREHENSIVE METABOLIC PANEL - Abnormal; Notable for the following:    Potassium 3.4 (*)    Glucose, Bld 106 (*)    Creatinine, Ser 1.29 (*)    GFR calc non Af Amer 46 (*)    GFR calc Af Amer 53 (*)    All other components within normal  limits  URINALYSIS COMPLETEWITH MICROSCOPIC ([facility] ONLY) - Abnormal; Notable for the following:    Color, Urine YELLOW (*)    APPearance HAZY (*)    Glucose, UA 50 (*)    Hgb urine dipstick 1+ (*)    Bacteria, UA MANY (*)    Squamous Epithelial / LPF 6-30 (*)    All other components within normal limits  URINE CULTURE  LIPASE, BLOOD  CBC   ____________________________________________  EKG   ____________________________________________  RADIOLOGY   ____________________________________________   PROCEDURES   ____________________________________________   INITIAL IMPRESSION / ASSESSMENT AND PLAN / ED COURSE  Pertinent labs & imaging results that were available during my care of the patient were reviewed by me and considered in my medical decision making (see chart for details).  ----------------------------------------- 5:19 PM on 03/16/2015 -----------------------------------------  Patient continues to rest comfortably. With classic symptoms for urinary tract infection. We'll treat for pyelonephritis for 10 days secondary to back pain associated with this although she says her back pain is unchanged. Despite her back pain being consistent I will treat her for pyelonephritis because I cannot differentiate based on her chronic symptoms. She will follow-up with her urologist in [place].  She will continue her interstitial cystitis meds at home. Nose return precautions such as fever, increased pain or burning with urination. Reviewed her history and she shows a history of chronic abdominal pain. Was admitted one time with an ileus although at that point she had nausea and vomiting. Denies any new symptoms today so is less likely to be this pathology. ____________________________________________   FINAL CLINICAL IMPRESSION(S) / ED DIAGNOSES  Acute pyelonephritis. Initial visit.    Myrna Blazer, MD 03/16/15 1720  Patient also has a history of kidney  stones. However, the patient is resting comfortably. Normal white count and afebrile here. Less likely to be this pathology.  Myrna Blazer, MD 03/16/15 [phone]

## 2015-03-16 NOTE — ED Notes (Signed)
Pt to ed with c/o lower abd pain, urinary frequency,  Pt reports foul odor to urine and lower back pain.

## 2015-03-16 NOTE — Discharge Instructions (Signed)

## 2015-03-18 LAB — URINE CULTURE

## 2015-04-05 DIAGNOSIS — R635 Abnormal weight gain: Secondary | ICD-10-CM | POA: Insufficient documentation

## 2015-04-13 DIAGNOSIS — M1712 Unilateral primary osteoarthritis, left knee: Secondary | ICD-10-CM | POA: Insufficient documentation

## 2015-04-26 ENCOUNTER — Emergency Department
Admission: EM | Admit: 2015-04-26 | Discharge: 2015-04-26 | Disposition: A | Discharge status: Home / Self Care | Payer: Medicaid Other | Attending: Emergency Medicine | Admitting: Emergency Medicine

## 2015-04-26 ENCOUNTER — Emergency Department: Payer: Medicaid Other

## 2015-04-26 DIAGNOSIS — I10 Essential (primary) hypertension: Secondary | ICD-10-CM | POA: Diagnosis not present

## 2015-04-26 DIAGNOSIS — Z880 Allergy status to penicillin: Secondary | ICD-10-CM | POA: Diagnosis not present

## 2015-04-26 DIAGNOSIS — M5441 Lumbago with sciatica, right side: Secondary | ICD-10-CM

## 2015-04-26 DIAGNOSIS — M545 Low back pain: Secondary | ICD-10-CM | POA: Diagnosis present

## 2015-04-26 MED ORDER — IBUPROFEN 800 MG PO TABS: 800.0000 mg | ORAL_TABLET | Freq: Three times a day (TID) | ORAL | Status: DC | PRN

## 2015-04-26 MED ORDER — CYCLOBENZAPRINE HCL 10 MG PO TABS: 10.0000 mg | ORAL_TABLET | Freq: Three times a day (TID) | ORAL | Status: DC | PRN

## 2015-04-26 MED ORDER — OXYCODONE-ACETAMINOPHEN 5-325 MG PO TABS: 1.0000 | ORAL_TABLET | ORAL | Status: DC | PRN

## 2015-04-26 MED ORDER — KETOROLAC TROMETHAMINE 60 MG/2ML IM SOLN
60.0000 mg | Freq: Once | INTRAMUSCULAR | Status: AC
  Administered 2015-04-26: 60 mg via INTRAMUSCULAR
  Filled 2015-04-26: qty 2

## 2015-04-26 MED ORDER — DIAZEPAM 5 MG/ML IJ SOLN
5.0000 mg | Freq: Once | INTRAMUSCULAR | Status: AC
  Administered 2015-04-26: 5 mg via INTRAMUSCULAR
  Filled 2015-04-26: qty 2

## 2015-04-26 MED ORDER — HYDROMORPHONE HCL 1 MG/ML IJ SOLN
1.0000 mg | Freq: Once | INTRAMUSCULAR | Status: AC
  Administered 2015-04-26: 1 mg via INTRAMUSCULAR
  Filled 2015-04-26: qty 1

## 2015-04-26 NOTE — ED Provider Notes (Signed)
[facility] Emergency Department Provider Note  ____________________________________________  Time seen: Approximately 11:06 AM  I have reviewed the triage vital signs and the nursing notes.   HISTORY  Chief Complaint Back Pain    HPI Joanna James is a 54 y.o. female who presents for evaluation of lower back pain which radiates into the right leg. Denies any injury. States the pain started approximately 2 days ago. Denies any saddle paresthesia however complains of pain running down her right leg.   Past Medical History  Diagnosis Date  . Hypertension   . IC (interstitial cystitis)   . Fibromyalgia   . Gout     There are no active problems to display for this patient.   Past Surgical History  Procedure Laterality Date  . Abdominal hysterectomy      Current Outpatient Rx  Name  Route  Sig  Dispense  Refill  . cyclobenzaprine (FLEXERIL) 10 MG tablet   Oral   Take 1 tablet (10 mg total) by mouth every 8 (eight) hours as needed for muscle spasms.   30 tablet   1   . ibuprofen (ADVIL,MOTRIN) 800 MG tablet   Oral   Take 1 tablet (800 mg total) by mouth every 8 (eight) hours as needed.   30 tablet   0   . oxyCODONE-acetaminophen (ROXICET) 5-325 MG tablet   Oral   Take 1-2 tablets by mouth every 4 (four) hours as needed for severe pain.   15 tablet   0     Allergies Amoxicillin; Azithromycin; Ciprofloxacin; and Sulfa antibiotics  No family history on file.  Social History Social History  Substance Use Topics  . Smoking status: Never Smoker   . Smokeless tobacco: None  . Alcohol Use: Yes    Review of Systems Cardiovascular: Denies chest pain. Respiratory: Denies shortness of breath. Gastrointestinal: No abdominal pain.  No nausea, no vomiting.  No diarrhea.  No constipation. Genitourinary: Negative for dysuria. Musculoskeletal: Positive low back pain. Skin: Negative for rash. Neurological: Negative for headaches,  focal weakness or numbness.  10-point ROS otherwise negative.  ____________________________________________   PHYSICAL EXAM:  VITAL SIGNS: ED Triage Vitals  Enc Vitals Group     BP 04/26/15 1020 118/68 mmHg     Pulse Rate 04/26/15 1020 81     Resp 04/26/15 1020 18     Temp 04/26/15 1020 97.7 F (36.5 C)     Temp Source 04/26/15 1020 Oral     SpO2 04/26/15 1020 94 %     Weight 04/26/15 1020 270 lb (122.471 kg)     Height 04/26/15 1020 5\' 4"  (1.626 m)     Head Cir --      Peak Flow --      Pain Score 04/26/15 1021 10     Pain Loc --      Pain Edu? --      Excl. in GC? --     Constitutional: Alert and oriented. Well appearing and in no acute distress. Neck: No stridor.   Cardiovascular: Normal rate, regular rhythm. Grossly normal heart sounds.  Good peripheral circulation. Respiratory: Normal respiratory effort.  No retractions. Lungs CTAB. Gastrointestinal: Soft and nontender. No distention. No abdominal bruits. No CVA tenderness. Musculoskeletal: No lower extremity tenderness nor edema.  No joint effusions. Neurologic:  Normal speech and language. No gross focal neurologic deficits are appreciated. No gait instability. Skin:  Skin is warm, dry and intact. No rash noted. Psychiatric: Mood and affect are normal.  Speech and behavior are normal.  ____________________________________________   LABS (all labs ordered are listed, but only abnormal results are displayed)  Labs Reviewed - No data to display ____________________________________________   RADIOLOGY  IMPRESSION: 1. No acute radiographic abnormality of the lumbar spine. 2. Mild multilevel degenerative disc disease and lumbar spondylosis. ____________________________________________   PROCEDURES  Procedure(s) performed: None  Critical Care performed: No  ____________________________________________   INITIAL IMPRESSION / ASSESSMENT AND PLAN / ED COURSE  Pertinent labs & imaging results that were  available during my care of the patient were reviewed by me and considered in my medical decision making (see chart for details).  Acute low back pain. Patient was given Dilaudid 0.5 mg IM, Valium 5 mg IM and Toradol 60 mg IM. Patient states pain minimally reduced however she does feel a little better. Rx given a discharge for Flexeril 10 mg Motrin 800 mg and oxycodone 5/325. She is to follow-up with her PCP at [facility] for continuity of care. Patient voices no other emergency medical conditions at this time. ____________________________________________   FINAL CLINICAL IMPRESSION(S) / ED DIAGNOSES  Final diagnoses:  Bilateral low back pain with right-sided sciatica      Evangeline Dakin, PA-C 04/26/15 1405  Myrna Blazer, MD 04/27/15 [phone]

## 2015-04-26 NOTE — ED Notes (Signed)
Lower back pain which is moving in to right leg  Unsure of injury  Pain started about 2 days ago unable to bear full wt

## 2015-04-26 NOTE — ED Notes (Signed)
Pt c/o right lower back pain for the past 2 days that radiates into the leg, denies injury

## 2015-04-26 NOTE — Discharge Instructions (Signed)
Chronic Back Pain ° When back pain lasts longer than 3 months, it is called chronic back pain. People with chronic back pain often go through certain periods that are more intense (flare-ups).  °CAUSES °Chronic back pain can be caused by wear and tear (degeneration) on different structures in your back. These structures include: °· The bones of your spine (vertebrae) and the joints surrounding your spinal cord and nerve roots (facets). °· The strong, fibrous tissues that connect your vertebrae (ligaments). °Degeneration of these structures may result in pressure on your nerves. This can lead to constant pain. °HOME CARE INSTRUCTIONS °· Avoid bending, heavy lifting, prolonged sitting, and activities which make the problem worse. °· Take brief periods of rest throughout the day to reduce your pain. Lying down or standing usually is better than sitting while you are resting. °· Take over-the-counter or prescription medicines only as directed by your caregiver. °SEEK IMMEDIATE MEDICAL CARE IF:  °· You have weakness or numbness in one of your legs or feet. °· You have trouble controlling your bladder or bowels. °· You have nausea, vomiting, abdominal pain, shortness of breath, or fainting. °  °This information is not intended to replace advice given to you by your health care provider. Make sure you discuss any questions you have with your health care provider. °  °Document Released: 06/08/2004 Document Revised: 07/24/2011 Document Reviewed: 10/19/2014 °Elsevier Interactive Patient Education ©2016 [employer] ° °Radicular Pain °Radicular pain in either the arm or leg is usually from a bulging or herniated disk in the spine. A piece of the herniated disk may press against the nerves as the nerves exit the spine. This causes pain which is felt at the tips of the nerves down the arm or leg. Other causes of radicular pain may include: °· Fractures. °· Heart disease. °· Cancer. °· An abnormal and usually degenerative state  of the nervous system or nerves (neuropathy). °Diagnosis may require CT or MRI scanning to determine the primary cause.  °Nerves that start at the neck (nerve roots) may cause radicular pain in the outer shoulder and arm. It can spread down to the thumb and fingers. The symptoms vary depending on which nerve root has been affected. In most cases radicular pain improves with conservative treatment. Neck problems may require physical therapy, a neck collar, or cervical traction. Treatment may take many weeks, and surgery may be considered if the symptoms do not improve.  °Conservative treatment is also recommended for sciatica. Sciatica causes pain to radiate from the lower back or buttock area down the leg into the foot. Often there is a history of back problems. Most patients with sciatica are better after 2 to 4 weeks of rest and other supportive care. Short term bed rest can reduce the disk pressure considerably. Sitting, however, is not a good position since this increases the pressure on the disk. You should avoid bending, lifting, and all other activities which make the problem worse. Traction can be used in severe cases. Surgery is usually reserved for patients who do not improve within the first months of treatment. °Only take over-the-counter or prescription medicines for pain, discomfort, or fever as directed by your caregiver. Narcotics and muscle relaxants may help by relieving more severe pain and spasm and by providing mild sedation. Cold or massage can give significant relief. Spinal manipulation is not recommended. It can increase the degree of disc protrusion. Epidural steroid injections are often effective treatment for radicular pain. These injections deliver medicine to the spinal   nerve in the space between the protective covering of the spinal cord and back bones (vertebrae). Your caregiver can give you more information about steroid injections. These injections are most effective when given  within two weeks of the onset of pain.  °You should see your caregiver for follow up care as recommended. A program for neck and back injury rehabilitation with stretching and strengthening exercises is an important part of management.  °SEEK IMMEDIATE MEDICAL CARE IF: °· You develop increased pain, weakness, or numbness in your arm or leg. °· You develop difficulty with bladder or bowel control. °· You develop abdominal pain. °  °This information is not intended to replace advice given to you by your health care provider. Make sure you discuss any questions you have with your health care provider. °  °Document Released: 06/08/2004 Document Revised: 05/22/2014 Document Reviewed: 11/25/2014 °Elsevier Interactive Patient Education ©2016 [employer] ° °Sciatica °Sciatica is pain, weakness, numbness, or tingling along the path of the sciatic nerve. The nerve starts in the lower back and runs down the back of each leg. The nerve controls the muscles in the lower leg and in the back of the knee, while also providing sensation to the back of the thigh, lower leg, and the sole of your foot. Sciatica is a symptom of another medical condition. For instance, nerve damage or certain conditions, such as a herniated disk or bone spur on the spine, pinch or put pressure on the sciatic nerve. This causes the pain, weakness, or other sensations normally associated with sciatica. Generally, sciatica only affects one side of the body. °CAUSES  °· Herniated or slipped disc. °· Degenerative disk disease. °· A pain disorder involving the narrow muscle in the buttocks (piriformis syndrome). °· Pelvic injury or fracture. °· Pregnancy. °· Tumor (rare). °SYMPTOMS  °Symptoms can vary from mild to very severe. The symptoms usually travel from the low back to the buttocks and down the back of the leg. Symptoms can include: °· Mild tingling or dull aches in the lower back, leg, or hip. °· Numbness in the back of the calf or sole of the  foot. °· Burning sensations in the lower back, leg, or hip. °· Sharp pains in the lower back, leg, or hip. °· Leg weakness. °· Severe back pain inhibiting movement. °These symptoms may get worse with coughing, sneezing, laughing, or prolonged sitting or standing. Also, being overweight may worsen symptoms. °DIAGNOSIS  °Your caregiver will perform a physical exam to look for common symptoms of sciatica. He or she may ask you to do certain movements or activities that would trigger sciatic nerve pain. Other tests may be performed to find the cause of the sciatica. These may include: °· Blood tests. °· X-rays. °· Imaging tests, such as an MRI or CT scan. °TREATMENT  °Treatment is directed at the cause of the sciatic pain. Sometimes, treatment is not necessary and the pain and discomfort goes away on its own. If treatment is needed, your caregiver may suggest: °· Over-the-counter medicines to relieve pain. °· Prescription medicines, such as anti-inflammatory medicine, muscle relaxants, or narcotics. °· Applying heat or ice to the painful area. °· Steroid injections to lessen pain, irritation, and inflammation around the nerve. °· Reducing activity during periods of pain. °· Exercising and stretching to strengthen your abdomen and improve flexibility of your spine. Your caregiver may suggest losing weight if the extra weight makes the back pain worse. °· Physical therapy. °· Surgery to eliminate what is pressing or   pinching the nerve, such as a bone spur or part of a herniated disk. °HOME CARE INSTRUCTIONS  °· Only take over-the-counter or prescription medicines for pain or discomfort as directed by your caregiver. °· Apply ice to the affected area for 20 minutes, 3-4 times a day for the first 48-72 hours. Then try heat in the same way. °· Exercise, stretch, or perform your usual activities if these do not aggravate your pain. °· Attend physical therapy sessions as directed by your caregiver. °· Keep all follow-up  appointments as directed by your caregiver. °· Do not wear high heels or shoes that do not provide proper support. °· Check your mattress to see if it is too soft. A firm mattress may lessen your pain and discomfort. °SEEK IMMEDIATE MEDICAL CARE IF:  °· You lose control of your bowel or bladder (incontinence). °· You have increasing weakness in the lower back, pelvis, buttocks, or legs. °· You have redness or swelling of your back. °· You have a burning sensation when you urinate. °· You have pain that gets worse when you lie down or awakens you at night. °· Your pain is worse than you have experienced in the past. °· Your pain is lasting longer than 4 weeks. °· You are suddenly losing weight without reason. °MAKE SURE YOU: °· Understand these instructions. °· Will watch your condition. °· Will get help right away if you are not doing well or get worse. °  °This information is not intended to replace advice given to you by your health care provider. Make sure you discuss any questions you have with your health care provider. °  °Document Released: 04/25/2001 Document Revised: 01/20/2015 Document Reviewed: 09/10/2011 °Elsevier Interactive Patient Education ©2016 [employer] ° °

## 2015-04-29 DIAGNOSIS — M5410 Radiculopathy, site unspecified: Secondary | ICD-10-CM | POA: Insufficient documentation

## 2015-05-30 ENCOUNTER — Emergency Department
Admission: EM | Admit: 2015-05-30 | Discharge: 2015-05-30 | Disposition: A | Discharge status: Home / Self Care | Payer: Medicare Other | Attending: Emergency Medicine | Admitting: Emergency Medicine

## 2015-05-30 DIAGNOSIS — Z880 Allergy status to penicillin: Secondary | ICD-10-CM | POA: Diagnosis not present

## 2015-05-30 DIAGNOSIS — Z3202 Encounter for pregnancy test, result negative: Secondary | ICD-10-CM | POA: Diagnosis not present

## 2015-05-30 DIAGNOSIS — N390 Urinary tract infection, site not specified: Secondary | ICD-10-CM | POA: Insufficient documentation

## 2015-05-30 DIAGNOSIS — I10 Essential (primary) hypertension: Secondary | ICD-10-CM | POA: Diagnosis not present

## 2015-05-30 DIAGNOSIS — R112 Nausea with vomiting, unspecified: Secondary | ICD-10-CM | POA: Insufficient documentation

## 2015-05-30 DIAGNOSIS — R1030 Lower abdominal pain, unspecified: Secondary | ICD-10-CM | POA: Diagnosis present

## 2015-05-30 LAB — COMPREHENSIVE METABOLIC PANEL
ALK PHOS: 68 U/L (ref 38–126)
ALT: 16 U/L (ref 14–54)
AST: 16 U/L (ref 15–41)
Albumin: 4.3 g/dL (ref 3.5–5.0)
Anion gap: 8 (ref 5–15)
BILIRUBIN TOTAL: 0.9 mg/dL (ref 0.3–1.2)
BUN: 11 mg/dL (ref 6–20)
CALCIUM: 9.4 mg/dL (ref 8.9–10.3)
CHLORIDE: 105 mmol/L (ref 101–111)
CO2: 26 mmol/L (ref 22–32)
CREATININE: 1.23 mg/dL — AB (ref 0.44–1.00)
GFR calc Af Amer: 56 mL/min — ABNORMAL LOW (ref >60)
GFR, EST NON AFRICAN AMERICAN: 48 mL/min — AB (ref >60)
Glucose, Bld: 102 mg/dL — ABNORMAL HIGH (ref 65–99)
Potassium: 4.1 mmol/L (ref 3.5–5.1)
Sodium: 139 mmol/L (ref 135–145)
Total Protein: 7.9 g/dL (ref 6.5–8.1)

## 2015-05-30 LAB — CBC
HCT: 38.3 % (ref 35.0–47.0)
Hemoglobin: 12 g/dL (ref 12.0–16.0)
MCH: 27.1 pg (ref 26.0–34.0)
MCHC: 31.4 g/dL — ABNORMAL LOW (ref 32.0–36.0)
MCV: 86.4 fL (ref 80.0–100.0)
PLATELETS: 313 10*3/uL (ref 150–440)
RBC: 4.43 MIL/uL (ref 3.80–5.20)
RDW: 15.2 % — AB (ref 11.5–14.5)
WBC: 7.8 10*3/uL (ref 3.6–11.0)

## 2015-05-30 LAB — URINALYSIS COMPLETE WITH MICROSCOPIC (ARMC ONLY)
BILIRUBIN URINE: NEGATIVE
GLUCOSE, UA: NEGATIVE mg/dL
Nitrite: NEGATIVE
Protein, ur: 30 mg/dL — AB
Specific Gravity, Urine: 1.021 (ref 1.005–1.030)
pH: 5 (ref 5.0–8.0)

## 2015-05-30 LAB — LIPASE, BLOOD: LIPASE: 17 U/L (ref 11–51)

## 2015-05-30 MED ORDER — ONDANSETRON HCL 4 MG/2ML IJ SOLN
4.0000 mg | Freq: Once | INTRAMUSCULAR | Status: AC
  Administered 2015-05-30: 4 mg via INTRAVENOUS
  Filled 2015-05-30: qty 2

## 2015-05-30 MED ORDER — OXYCODONE-ACETAMINOPHEN 5-325 MG PO TABS: 1.0000 | ORAL_TABLET | Freq: Four times a day (QID) | ORAL | Status: DC | PRN

## 2015-05-30 MED ORDER — PROMETHAZINE HCL 25 MG/ML IJ SOLN
12.5000 mg | Freq: Once | INTRAMUSCULAR | Status: AC
  Administered 2015-05-30: 12.5 mg via INTRAVENOUS
  Filled 2015-05-30: qty 1

## 2015-05-30 MED ORDER — PROMETHAZINE HCL 12.5 MG PO TABS: 12.5000 mg | ORAL_TABLET | Freq: Four times a day (QID) | ORAL | Status: DC | PRN

## 2015-05-30 MED ORDER — DEXTROSE 5 % IV SOLN
1.0000 g | Freq: Once | INTRAVENOUS | Status: AC
  Administered 2015-05-30: 1 g via INTRAVENOUS
  Filled 2015-05-30: qty 10

## 2015-05-30 MED ORDER — CEPHALEXIN 500 MG PO CAPS: 500.0000 mg | ORAL_CAPSULE | Freq: Three times a day (TID) | ORAL | Status: DC

## 2015-05-30 MED ORDER — OXYCODONE-ACETAMINOPHEN 5-325 MG PO TABS
2.0000 | ORAL_TABLET | Freq: Once | ORAL | Status: AC
  Administered 2015-05-30: 2 via ORAL
  Filled 2015-05-30: qty 2

## 2015-05-30 MED ORDER — MORPHINE SULFATE (PF) 4 MG/ML IV SOLN
4.0000 mg | Freq: Once | INTRAVENOUS | Status: AC
  Administered 2015-05-30: 4 mg via INTRAVENOUS
  Filled 2015-05-30: qty 1

## 2015-05-30 MED ORDER — SODIUM CHLORIDE 0.9 % IV BOLUS (SEPSIS)
1000.0000 mL | Freq: Once | INTRAVENOUS | Status: AC
  Administered 2015-05-30: 1000 mL via INTRAVENOUS

## 2015-05-30 NOTE — ED Notes (Signed)
POCT urine pregnancy negative 

## 2015-05-30 NOTE — Discharge Instructions (Signed)
Nausea and Vomiting °Nausea is a sick feeling that often comes before throwing up (vomiting). Vomiting is a reflex where stomach contents come out of your mouth. Vomiting can cause severe loss of body fluids (dehydration). Children and elderly adults can become dehydrated quickly, especially if they also have diarrhea. Nausea and vomiting are symptoms of a condition or disease. It is important to find the cause of your symptoms. °CAUSES  °· Direct irritation of the stomach lining. This irritation can result from increased acid production (gastroesophageal reflux disease), infection, food poisoning, taking certain medicines (such as nonsteroidal anti-inflammatory drugs), alcohol use, or tobacco use. °· Signals from the brain. These signals could be caused by a headache, heat exposure, an inner ear disturbance, increased pressure in the brain from injury, infection, a tumor, or a concussion, pain, emotional stimulus, or metabolic problems. °· An obstruction in the gastrointestinal tract (bowel obstruction). °· Illnesses such as diabetes, hepatitis, gallbladder problems, appendicitis, kidney problems, cancer, sepsis, atypical symptoms of a heart attack, or eating disorders. °· Medical treatments such as chemotherapy and radiation. °· Receiving medicine that makes you sleep (general anesthetic) during surgery. °DIAGNOSIS °Your caregiver may ask for tests to be done if the problems do not improve after a few days. Tests may also be done if symptoms are severe or if the reason for the nausea and vomiting is not clear. Tests may include: °· Urine tests. °· Blood tests. °· Stool tests. °· Cultures (to look for evidence of infection). °· X-rays or other imaging studies. °Test results can help your caregiver make decisions about treatment or the need for additional tests. °TREATMENT °You need to stay well hydrated. Drink frequently but in small amounts. You may wish to drink water, sports drinks, clear broth, or eat frozen  ice pops or gelatin dessert to help stay hydrated. When you eat, eating slowly may help prevent nausea. There are also some antinausea medicines that may help prevent nausea. °HOME CARE INSTRUCTIONS  °· Take all medicine as directed by your caregiver. °· If you do not have an appetite, do not force yourself to eat. However, you must continue to drink fluids. °· If you have an appetite, eat a normal diet unless your caregiver tells you differently. °· Eat a variety of complex carbohydrates (rice, wheat, potatoes, bread), lean meats, yogurt, fruits, and vegetables. °· Avoid high-fat foods because they are more difficult to digest. °· Drink enough water and fluids to keep your urine clear or pale yellow. °· If you are dehydrated, ask your caregiver for specific rehydration instructions. Signs of dehydration may include: °· Severe thirst. °· Dry lips and mouth. °· Dizziness. °· Dark urine. °· Decreasing urine frequency and amount. °· Confusion. °· Rapid breathing or pulse. °SEEK IMMEDIATE MEDICAL CARE IF:  °· You have blood or brown flecks (like coffee grounds) in your vomit. °· You have black or bloody stools. °· You have a severe headache or stiff neck. °· You are confused. °· You have severe abdominal pain. °· You have chest pain or trouble breathing. °· You do not urinate at least once every 8 hours. °· You develop cold or clammy skin. °· You continue to vomit for longer than 24 to 48 hours. °· You have a fever. °MAKE SURE YOU:  °· Understand these instructions. °· Will watch your condition. °· Will get help right away if you are not doing well or get worse. °  °This information is not intended to replace advice given to you by your health care provider. Make sure   you discuss any questions you have with your health care provider. °  °Document Released: 05/01/2005 Document Revised: 07/24/2011 Document Reviewed: 09/28/2010 °Elsevier Interactive Patient Education ©2016 [employer] ° °Urinary Tract Infection °A  urinary tract infection (UTI) can occur any place along the urinary tract. The tract includes the kidneys, ureters, bladder, and urethra. A type of germ called bacteria often causes a UTI. UTIs are often helped with antibiotic medicine.  °HOME CARE  °· If given, take antibiotics as told by your doctor. Finish them even if you start to feel better. °· Drink enough fluids to keep your pee (urine) clear or pale yellow. °· Avoid tea, drinks with caffeine, and bubbly (carbonated) drinks. °· Pee often. Avoid holding your pee in for a long time. °· Pee before and after having sex (intercourse). °· Wipe from front to back after you poop (bowel movement) if you are a woman. Use each tissue only once. °GET HELP RIGHT AWAY IF:  °· You have back pain. °· You have lower belly (abdominal) pain. °· You have chills. °· You feel sick to your stomach (nauseous). °· You throw up (vomit). °· Your burning or discomfort with peeing does not go away. °· You have a fever. °· Your symptoms are not better in 3 days. °MAKE SURE YOU:  °· Understand these instructions. °· Will watch your condition. °· Will get help right away if you are not doing well or get worse. °  °This information is not intended to replace advice given to you by your health care provider. Make sure you discuss any questions you have with your health care provider. °  °Document Released: 10/18/2007 Document Revised: 05/22/2014 Document Reviewed: 11/30/2011 °Elsevier Interactive Patient Education ©2016 [employer] ° °

## 2015-05-30 NOTE — ED Provider Notes (Signed)
[facility] Emergency Department Provider Note  Time seen: 6:31 PM  I have reviewed the triage vital signs and the nursing notes.   HISTORY  Chief Complaint Post-op Problem    HPI Joanna James is a 55 y.o. female with a past medical history of interstitial cystitis, hypertension, fibromyalgia, status post recent bladder mesh removal 1 month ago who presents the emergency department with dysuria. According to the patient she had a bladder mass [facility] in mid December [facility]. She states she was diagnosed with urinary tract infection 2 weeks ago, she was prescribed ciprofloxacin but states it made her too nauseated so she stopped taking it. She states since that time she has been intermittently nauseated with vomiting, and having painful urination. Denies fever. States lower abdominal pain but unchanged since her surgery. Patient has not contacted her surgeon, but has a follow-up appointment on Tuesday. States she was going to wait until her follow-up appointment however she felt she was getting dehydrated so she came to the emergency department.Describes her lower abdominal pain as mild to moderate.     Past Medical History  Diagnosis Date  . Hypertension   . IC (interstitial cystitis)   . Fibromyalgia   . Gout     There are no active problems to display for this patient.   Past Surgical History  Procedure Laterality Date  . Abdominal hysterectomy      Current Outpatient Rx  Name  Route  Sig  Dispense  Refill  . cyclobenzaprine (FLEXERIL) 10 MG tablet   Oral   Take 1 tablet (10 mg total) by mouth every 8 (eight) hours as needed for muscle spasms.   30 tablet   1   . ibuprofen (ADVIL,MOTRIN) 800 MG tablet   Oral   Take 1 tablet (800 mg total) by mouth every 8 (eight) hours as needed.   30 tablet   0   . oxyCODONE-acetaminophen (ROXICET) 5-325 MG tablet   Oral   Take 1-2 tablets by mouth every 4 (four)  hours as needed for severe pain.   15 tablet   0     Allergies Amoxicillin; Azithromycin; Ciprofloxacin; and Sulfa antibiotics  No family history on file.  Social History Social History  Substance Use Topics  . Smoking status: Never Smoker   . Smokeless tobacco: Not on file  . Alcohol Use: Yes    Review of Systems Constitutional: Negative for fever. Cardiovascular: Negative for chest pain. Respiratory: Negative for shortness of breath. Gastrointestinal: Positive for lower abdominal pain, largely unchanged. Positive for nausea and vomiting. Negative for diarrhea. Genitourinary: Positive for dysuria although she states is much improved from what it was 2 weeks ago, states her urine was cloudy but is now clear. Musculoskeletal: Negative for back pain Neurological: Negative for headache 10-point ROS otherwise negative.  ____________________________________________   PHYSICAL EXAM:  VITAL SIGNS: ED Triage Vitals  Enc Vitals Group     BP 05/30/15 1505 135/85 mmHg     Pulse Rate 05/30/15 1505 76     Resp 05/30/15 1505 18     Temp 05/30/15 1505 97.9 F (36.6 C)     Temp src --      SpO2 05/30/15 1505 99 %     Weight 05/30/15 1505 270 lb (122.471 kg)     Height 05/30/15 1505 5\' 4"  (1.626 m)     Head Cir --      Peak Flow --      Pain  Score 05/30/15 1506 9     Pain Loc --      Pain Edu? --      Excl. in [place]? --     Constitutional: Alert and oriented. Well appearing and in no distress. Eyes: Normal exam ENT   Head: Normocephalic and atraumatic.   Mouth/Throat: Mucous membranes are moist. Cardiovascular: Normal rate, regular rhythm. No murmurs, rubs, or gallops. Respiratory: Normal respiratory effort without tachypnea nor retractions. Breath sounds are clear Gastrointestinal: Soft, mild lower abdominal tenderness palpation. No rebound or guarding. No distention. Musculoskeletal: Nontender with normal range of motion in all extremities.  Neurologic:  Normal  speech and language. No gross focal neurologic deficits  Skin:  Skin is warm, dry and intact.  Psychiatric: Mood and affect are normal. Speech and behavior are normal.   ____________________________________________   INITIAL IMPRESSION / ASSESSMENT AND PLAN / ED COURSE  Pertinent labs & imaging results that were available during my care of the patient were reviewed by me and considered in my medical decision making (see chart for details).  Patient presents with nausea, vomiting, dysuria. Labs show urinary tract infection, no other acute findings. We will treat with Rocephin, IV fluids, pain and nausea medication. We'll monitor the patient closely.  Urinalysis shows too numerous to count red white blood cells however only rare bacteria. This could be consistent with her interstitial cystitis versus urinary tract infection. We'll dose Rocephin in the emergency department. As long as the patient is feeling well, able to tolerate oral fluids we'll discharge home with Keflex. I have sent a urine culture. The patient has follow-up with her surgeon in 2 days.  Patient feeling better, we'll discharge home with surgeon follow-up in 2 days.  ____________________________________________   FINAL CLINICAL IMPRESSION(S) / ED DIAGNOSES  Urinary tract infection Nausea and vomiting   Minna Antis, MD 05/30/15 2146

## 2015-05-30 NOTE — ED Notes (Signed)
Pt had a bladder mesh removed 04/30/15 per pt. Pt reports becoming febrile afterwards and  Being placed on Cipro. Pt reports not being able to tolerate cipro so she quit taking it. Pt now reports 1 weeks of vomiting and painful urination.

## 2015-06-01 LAB — URINE CULTURE

## 2015-06-15 DIAGNOSIS — K5790 Diverticulosis of intestine, part unspecified, without perforation or abscess without bleeding: Secondary | ICD-10-CM | POA: Insufficient documentation

## 2016-04-11 DIAGNOSIS — G5702 Lesion of sciatic nerve, left lower limb: Secondary | ICD-10-CM | POA: Insufficient documentation

## 2016-06-20 DIAGNOSIS — M4800 Spinal stenosis, site unspecified: Secondary | ICD-10-CM | POA: Insufficient documentation

## 2016-06-20 DIAGNOSIS — Z87442 Personal history of urinary calculi: Secondary | ICD-10-CM | POA: Insufficient documentation

## 2016-06-20 DIAGNOSIS — G894 Chronic pain syndrome: Secondary | ICD-10-CM | POA: Diagnosis not present

## 2016-06-20 DIAGNOSIS — I10 Essential (primary) hypertension: Secondary | ICD-10-CM | POA: Insufficient documentation

## 2016-06-20 DIAGNOSIS — Z888 Allergy status to other drugs, medicaments and biological substances status: Secondary | ICD-10-CM | POA: Insufficient documentation

## 2016-06-20 DIAGNOSIS — R109 Unspecified abdominal pain: Principal | ICD-10-CM | POA: Insufficient documentation

## 2016-06-20 DIAGNOSIS — Z90710 Acquired absence of both cervix and uterus: Secondary | ICD-10-CM | POA: Insufficient documentation

## 2016-06-20 DIAGNOSIS — K219 Gastro-esophageal reflux disease without esophagitis: Secondary | ICD-10-CM | POA: Insufficient documentation

## 2016-06-20 DIAGNOSIS — Z885 Allergy status to narcotic agent status: Secondary | ICD-10-CM | POA: Diagnosis not present

## 2016-06-20 DIAGNOSIS — Z881 Allergy status to other antibiotic agents status: Secondary | ICD-10-CM | POA: Insufficient documentation

## 2016-06-20 DIAGNOSIS — M109 Gout, unspecified: Secondary | ICD-10-CM | POA: Insufficient documentation

## 2016-06-20 DIAGNOSIS — Z7982 Long term (current) use of aspirin: Secondary | ICD-10-CM | POA: Insufficient documentation

## 2016-06-20 DIAGNOSIS — N3010 Interstitial cystitis (chronic) without hematuria: Secondary | ICD-10-CM | POA: Diagnosis not present

## 2016-06-20 DIAGNOSIS — Z79899 Other long term (current) drug therapy: Secondary | ICD-10-CM | POA: Diagnosis not present

## 2016-06-20 DIAGNOSIS — N200 Calculus of kidney: Secondary | ICD-10-CM | POA: Insufficient documentation

## 2016-06-20 DIAGNOSIS — Z882 Allergy status to sulfonamides status: Secondary | ICD-10-CM | POA: Diagnosis not present

## 2016-06-20 DIAGNOSIS — E890 Postprocedural hypothyroidism: Secondary | ICD-10-CM | POA: Insufficient documentation

## 2016-06-20 DIAGNOSIS — Z880 Allergy status to penicillin: Secondary | ICD-10-CM | POA: Insufficient documentation

## 2016-06-20 DIAGNOSIS — N3281 Overactive bladder: Secondary | ICD-10-CM | POA: Insufficient documentation

## 2016-06-20 DIAGNOSIS — M797 Fibromyalgia: Secondary | ICD-10-CM | POA: Insufficient documentation

## 2016-06-20 DIAGNOSIS — G629 Polyneuropathy, unspecified: Secondary | ICD-10-CM | POA: Diagnosis not present

## 2016-06-20 DIAGNOSIS — K449 Diaphragmatic hernia without obstruction or gangrene: Secondary | ICD-10-CM | POA: Diagnosis not present

## 2016-06-20 DIAGNOSIS — G5700 Lesion of sciatic nerve, unspecified lower limb: Secondary | ICD-10-CM | POA: Insufficient documentation

## 2016-06-21 ENCOUNTER — Observation Stay
Admission: EM | Admit: 2016-06-21 | Discharge: 2016-06-22 | Disposition: A | Discharge status: Home / Self Care | Payer: Medicare Other | Attending: Internal Medicine | Admitting: Internal Medicine

## 2016-06-21 ENCOUNTER — Encounter: Payer: Self-pay | Admitting: Emergency Medicine

## 2016-06-21 ENCOUNTER — Emergency Department: Payer: Medicare Other

## 2016-06-21 DIAGNOSIS — R109 Unspecified abdominal pain: Secondary | ICD-10-CM | POA: Diagnosis not present

## 2016-06-21 HISTORY — DX: Spinal stenosis, site unspecified: M48.00

## 2016-06-21 HISTORY — DX: Chronic pain syndrome: G89.4

## 2016-06-21 LAB — COMPREHENSIVE METABOLIC PANEL
ALK PHOS: 75 U/L (ref 38–126)
ALT: 11 U/L — AB (ref 14–54)
AST: 16 U/L (ref 15–41)
Albumin: 3.9 g/dL (ref 3.5–5.0)
Anion gap: 5 (ref 5–15)
BUN: 17 mg/dL (ref 6–20)
CALCIUM: 8.9 mg/dL (ref 8.9–10.3)
CO2: 28 mmol/L (ref 22–32)
CREATININE: 1.2 mg/dL — AB (ref 0.44–1.00)
Chloride: 107 mmol/L (ref 101–111)
GFR calc non Af Amer: 50 mL/min — ABNORMAL LOW (ref >60)
GFR, EST AFRICAN AMERICAN: 57 mL/min — AB (ref >60)
Glucose, Bld: 119 mg/dL — ABNORMAL HIGH (ref 65–99)
Potassium: 3.9 mmol/L (ref 3.5–5.1)
SODIUM: 140 mmol/L (ref 135–145)
Total Bilirubin: 0.3 mg/dL (ref 0.3–1.2)
Total Protein: 7.3 g/dL (ref 6.5–8.1)

## 2016-06-21 LAB — CBC
HCT: 36.6 % (ref 35.0–47.0)
Hemoglobin: 12.5 g/dL (ref 12.0–16.0)
MCH: 29.2 pg (ref 26.0–34.0)
MCHC: 34.1 g/dL (ref 32.0–36.0)
MCV: 85.5 fL (ref 80.0–100.0)
PLATELETS: 281 10*3/uL (ref 150–440)
RBC: 4.29 MIL/uL (ref 3.80–5.20)
RDW: 13.7 % (ref 11.5–14.5)
WBC: 8.8 10*3/uL (ref 3.6–11.0)

## 2016-06-21 LAB — URINALYSIS, COMPLETE (UACMP) WITH MICROSCOPIC
Bilirubin Urine: NEGATIVE
GLUCOSE, UA: NEGATIVE mg/dL
HGB URINE DIPSTICK: NEGATIVE
Ketones, ur: NEGATIVE mg/dL
Leukocytes, UA: NEGATIVE
Nitrite: NEGATIVE
Protein, ur: NEGATIVE mg/dL
SPECIFIC GRAVITY, URINE: 1.023 (ref 1.005–1.030)
pH: 5 (ref 5.0–8.0)

## 2016-06-21 LAB — SEDIMENTATION RATE: Sed Rate: 24 mm/hr (ref 0–30)

## 2016-06-21 LAB — LACTIC ACID, PLASMA: Lactic Acid, Venous: 0.9 mmol/L (ref 0.5–1.9)

## 2016-06-21 LAB — LIPASE, BLOOD: Lipase: 23 U/L (ref 11–51)

## 2016-06-21 MED ORDER — ONDANSETRON HCL 4 MG PO TABS: 4.0000 mg | ORAL_TABLET | Freq: Four times a day (QID) | ORAL | Status: DC | PRN

## 2016-06-21 MED ORDER — HYDROMORPHONE HCL 1 MG/ML IJ SOLN
0.5000 mg | Freq: Once | INTRAMUSCULAR | Status: AC
  Administered 2016-06-21: 0.5 mg via INTRAVENOUS
  Filled 2016-06-21: qty 1

## 2016-06-21 MED ORDER — MORPHINE SULFATE (PF) 2 MG/ML IV SOLN
INTRAVENOUS | Status: AC
  Administered 2016-06-21: 4 mg via INTRAVENOUS
  Filled 2016-06-21: qty 2

## 2016-06-21 MED ORDER — BISACODYL 10 MG RE SUPP
10.0000 mg | Freq: Every day | RECTAL | Status: DC | PRN
  Administered 2016-06-21: 10 mg via RECTAL
  Filled 2016-06-21: qty 1

## 2016-06-21 MED ORDER — MORPHINE SULFATE (PF) 2 MG/ML IV SOLN
2.0000 mg | INTRAVENOUS | Status: DC | PRN
  Administered 2016-06-21 – 2016-06-22 (×4): 2 mg via INTRAVENOUS
  Filled 2016-06-21 (×4): qty 1

## 2016-06-21 MED ORDER — ENOXAPARIN SODIUM 40 MG/0.4ML ~~LOC~~ SOLN
40.0000 mg | Freq: Two times a day (BID) | SUBCUTANEOUS | Status: DC
  Administered 2016-06-21 – 2016-06-22 (×2): 40 mg via SUBCUTANEOUS
  Filled 2016-06-21 (×2): qty 0.4

## 2016-06-21 MED ORDER — ASPIRIN EC 81 MG PO TBEC
81.0000 mg | DELAYED_RELEASE_TABLET | Freq: Every day | ORAL | Status: DC
  Administered 2016-06-21 – 2016-06-22 (×2): 81 mg via ORAL
  Filled 2016-06-21 (×2): qty 1

## 2016-06-21 MED ORDER — DICLOFENAC EPOLAMINE 1.3 % TD PTCH
1.0000 | MEDICATED_PATCH | Freq: Two times a day (BID) | TRANSDERMAL | Status: DC
  Administered 2016-06-21 – 2016-06-22 (×2): 1 via TRANSDERMAL
  Filled 2016-06-21 (×3): qty 1

## 2016-06-21 MED ORDER — TRAZODONE HCL 50 MG PO TABS: 25.0000 mg | ORAL_TABLET | Freq: Every evening | ORAL | Status: DC | PRN

## 2016-06-21 MED ORDER — ONDANSETRON HCL 4 MG/2ML IJ SOLN: 4.0000 mg | Freq: Four times a day (QID) | INTRAMUSCULAR | Status: DC | PRN

## 2016-06-21 MED ORDER — MIRABEGRON ER 50 MG PO TB24
50.0000 mg | ORAL_TABLET | Freq: Every day | ORAL | Status: DC
  Administered 2016-06-21 – 2016-06-22 (×2): 50 mg via ORAL
  Filled 2016-06-21 (×2): qty 1

## 2016-06-21 MED ORDER — MORPHINE SULFATE (PF) 4 MG/ML IV SOLN
4.0000 mg | Freq: Once | INTRAVENOUS | Status: DC
  Filled 2016-06-21: qty 1

## 2016-06-21 MED ORDER — ONDANSETRON HCL 4 MG/2ML IJ SOLN
4.0000 mg | Freq: Once | INTRAMUSCULAR | Status: AC
  Administered 2016-06-21: 4 mg via INTRAVENOUS
  Filled 2016-06-21: qty 2

## 2016-06-21 MED ORDER — LISINOPRIL 10 MG PO TABS
10.0000 mg | ORAL_TABLET | Freq: Every day | ORAL | Status: DC
  Administered 2016-06-21 – 2016-06-22 (×2): 10 mg via ORAL
  Filled 2016-06-21 (×2): qty 1

## 2016-06-21 MED ORDER — MORPHINE SULFATE (PF) 4 MG/ML IV SOLN
4.0000 mg | Freq: Once | INTRAVENOUS | Status: AC
  Administered 2016-06-21: 4 mg via INTRAVENOUS

## 2016-06-21 MED ORDER — SODIUM CHLORIDE 0.9 % IV SOLN
INTRAVENOUS | Status: DC
  Administered 2016-06-21 – 2016-06-22 (×2): via INTRAVENOUS

## 2016-06-21 MED ORDER — ONDANSETRON HCL 4 MG/2ML IJ SOLN
4.0000 mg | Freq: Four times a day (QID) | INTRAMUSCULAR | Status: DC | PRN
Start: 2016-06-21 — End: 2016-06-22

## 2016-06-21 MED ORDER — PREGABALIN 50 MG PO CAPS
100.0000 mg | ORAL_CAPSULE | Freq: Three times a day (TID) | ORAL | Status: DC
  Administered 2016-06-21 – 2016-06-22 (×3): 100 mg via ORAL
  Filled 2016-06-21 (×3): qty 2

## 2016-06-21 MED ORDER — LEVOTHYROXINE SODIUM 50 MCG PO TABS
225.0000 ug | ORAL_TABLET | Freq: Every day | ORAL | Status: DC
  Administered 2016-06-21: 225 ug via ORAL
  Filled 2016-06-21: qty 4.5

## 2016-06-21 MED ORDER — SODIUM CHLORIDE 0.9 % IV BOLUS (SEPSIS)
500.0000 mL | Freq: Once | INTRAVENOUS | Status: AC
  Administered 2016-06-21: 500 mL via INTRAVENOUS

## 2016-06-21 MED ORDER — ONDANSETRON 4 MG PO TBDP
4.0000 mg | ORAL_TABLET | Freq: Once | ORAL | Status: AC | PRN
  Administered 2016-06-21: 4 mg via ORAL

## 2016-06-21 MED ORDER — ONDANSETRON 4 MG PO TBDP
ORAL_TABLET | ORAL | Status: AC
  Administered 2016-06-21: 4 mg via ORAL
  Filled 2016-06-21: qty 1

## 2016-06-21 MED ORDER — PANTOPRAZOLE SODIUM 40 MG IV SOLR
40.0000 mg | Freq: Two times a day (BID) | INTRAVENOUS | Status: DC
  Administered 2016-06-21 – 2016-06-22 (×2): 40 mg via INTRAVENOUS
  Filled 2016-06-21 (×2): qty 40

## 2016-06-21 MED ORDER — HYDROXYZINE HCL 25 MG PO TABS
25.0000 mg | ORAL_TABLET | Freq: Two times a day (BID) | ORAL | Status: DC
Start: 2016-06-21 — End: 2016-06-22
  Administered 2016-06-21 – 2016-06-22 (×2): 25 mg via ORAL
  Filled 2016-06-21 (×3): qty 1

## 2016-06-21 MED ORDER — AMITRIPTYLINE HCL 50 MG PO TABS
50.0000 mg | ORAL_TABLET | Freq: Every day | ORAL | Status: DC
  Administered 2016-06-21: 21:00:00 50 mg via ORAL
  Filled 2016-06-21: qty 1

## 2016-06-21 NOTE — ED Notes (Signed)
Pt uprite on stretcher in exam room with no distress noted; pt reports feeling better with decreased and intermittent pain

## 2016-06-21 NOTE — ED Notes (Signed)
Pt up to room commode to void; st pain increasing to 10/10; pt assisted back to bed & med admin as ordered; pt to CT via stretcher accomp by CT tech

## 2016-06-21 NOTE — ED Notes (Signed)
Pt ret from CT; st pain decreased now to 7/10

## 2016-06-21 NOTE — ED Notes (Signed)
Pt sitting up on side of bed with no distress noted; reports since Monday having left flank pain radiating into left lower abd with no accomp symptoms; denies any urinary c/o; st hx chronic abd pain; +BS, abd soft/nondist/nontender

## 2016-06-21 NOTE — Care Management Obs Status (Signed)
MEDICARE OBSERVATION STATUS NOTIFICATION   Patient Details  Name: Joanna James MRN: [id number] Date of Birth: 27-Feb-1961   Medicare Observation Status Notification Given:   Yes    Berna BueCheryl Hydee Fleece, RN 06/21/2016, 2:11 PM

## 2016-06-21 NOTE — ED Provider Notes (Signed)
[facility] Emergency Department Provider Note   ____________________________________________   First MD Initiated Contact with Patient 06/21/16 [phone]     (approximate)  I have reviewed the triage vital signs and the nursing notes.   HISTORY  Chief Complaint Abdominal Pain    HPI Joanna James is a 56 y.o. female who presents to the ED from home with a chief complaint of abdominal pain.Patient reports a 2 day history of left upper quadrant, left flank and epigastric pain which is exacerbated by eating. States she has chronic abdominal pain but this feels different. Describes sharp, radiating pain not associated with fever, chills, chest pain, shortness of breath, nausea, vomiting, dysuria, diarrhea. Denies recent travel or trauma. States last month she had an ultrasound done by her urologist which demonstrated kidney stones but she is unsure which side.   Past Medical History:  Diagnosis Date  . Chronic pain syndrome   . Fibromyalgia   . Gout   . Hypertension   . IC (interstitial cystitis)   . Spinal stenosis     There are no active problems to display for this patient.   Past Surgical History:  Procedure Laterality Date  . ABDOMINAL HYSTERECTOMY    . APPENDECTOMY    . BLADDER SUSPENSION    . ECTOPIC PREGNANCY SURGERY    . meniscus tear    . ROTATOR CUFF REPAIR    . THYROIDECTOMY    . TUBAL LIGATION      Prior to Admission medications   Medication Sig Start Date End Date Taking? Authorizing Provider  cephALEXin (KEFLEX) 500 MG capsule Take 1 capsule (500 mg total) by mouth 3 (three) times daily. 05/30/15   Harvest Dark, MD  cyclobenzaprine (FLEXERIL) 10 MG tablet Take 1 tablet (10 mg total) by mouth every 8 (eight) hours as needed for muscle spasms. 04/26/15   Pierce Crane Beers, PA-C  ibuprofen (ADVIL,MOTRIN) 800 MG tablet Take 1 tablet (800 mg total) by mouth every 8 (eight) hours as needed. 04/26/15   Arlyss Repress, PA-C    oxyCODONE-acetaminophen (ROXICET) 5-325 MG tablet Take 1 tablet by mouth every 6 (six) hours as needed. 05/30/15   Harvest Dark, MD  promethazine (PHENERGAN) 12.5 MG tablet Take 1 tablet (12.5 mg total) by mouth every 6 (six) hours as needed for nausea or vomiting. 05/30/15   Harvest Dark, MD    Allergies Amoxicillin; Azithromycin; Ciprofloxacin; Hydrocodone; Percocet [oxycodone-acetaminophen]; and Sulfa antibiotics  No family history on file.  Social History Social History  Substance Use Topics  . Smoking status: Never Smoker  . Smokeless tobacco: Never Used  . Alcohol use Yes    Review of Systems  Constitutional: No fever/chills. Eyes: No visual changes. ENT: No sore throat. Cardiovascular: Denies chest pain. Respiratory: Denies shortness of breath. Gastrointestinal: Positive for abdominal pain.  No nausea, no vomiting.  No diarrhea.  No constipation. Genitourinary: Negative for dysuria. Musculoskeletal: Negative for back pain. Skin: Negative for rash. Neurological: Negative for headaches, focal weakness or numbness.  10-point ROS otherwise negative.  ____________________________________________   PHYSICAL EXAM:  VITAL SIGNS: ED Triage Vitals  Enc Vitals Group     BP 06/21/16 0004 (!) 146/94     Pulse Rate 06/21/16 0004 84     Resp 06/21/16 0004 (!) 22     Temp 06/21/16 0004 98.5 F (36.9 C)     Temp Source 06/21/16 0004 Oral     SpO2 06/21/16 0004 97 %     Weight 06/21/16 0004  240 lb (108.9 kg)     Height 06/21/16 0004 '5\' 3"'$  (1.6 m)     Head Circumference --      Peak Flow --      Pain Score 06/21/16 0020 10     Pain Loc --      Pain Edu? --      Excl. in [place]? --     Constitutional: Asleep, easily awakened for exam. Alert and oriented. Well appearing and in no acute distress. Eyes: Conjunctivae are normal. PERRL. EOMI. Head: Atraumatic. Nose: No congestion/rhinnorhea. Mouth/Throat: Mucous membranes are moist.  Oropharynx  non-erythematous. Neck: No stridor.   Cardiovascular: Normal rate, regular rhythm. Grossly normal heart sounds.  Good peripheral circulation. Respiratory: Normal respiratory effort.  No retractions. Lungs CTAB. Gastrointestinal: Soft and mildly tender to palpation upper abdomen without rebound or guarding. No distention. No abdominal bruits. No CVA tenderness. Musculoskeletal: No lower extremity tenderness nor edema.  No joint effusions. Neurologic:  Normal speech and language. No gross focal neurologic deficits are appreciated. No gait instability. Skin:  Skin is warm, dry and intact. No rash noted. Psychiatric: Mood and affect are normal. Speech and behavior are normal.  ____________________________________________   LABS (all labs ordered are listed, but only abnormal results are displayed)  Labs Reviewed  COMPREHENSIVE METABOLIC PANEL - Abnormal; Notable for the following:       Result Value   Glucose, Bld 119 (*)    Creatinine, Ser 1.20 (*)    ALT 11 (*)    GFR calc non Af Amer 50 (*)    GFR calc Af Amer 57 (*)    All other components within normal limits  URINALYSIS, COMPLETE (UACMP) WITH MICROSCOPIC - Abnormal; Notable for the following:    Color, Urine YELLOW (*)    APPearance CLEAR (*)    Bacteria, UA RARE (*)    Squamous Epithelial / LPF 0-5 (*)    All other components within normal limits  LIPASE, BLOOD  CBC  SEDIMENTATION RATE  LACTIC ACID, PLASMA  LACTIC ACID, PLASMA   ____________________________________________  EKG  None ____________________________________________  RADIOLOGY  Ultrasound interpreted per Dr. Gerilyn Nestle: Normal examination.  CT renal stone study interpreted per Dr. Gerilyn Nestle: Hazy infiltration of the mesenteric could indicate mesenteritis.  Nonobstructing bilateral renal stones. Small esophageal hiatal  hernia.   ____________________________________________   PROCEDURES  Procedure(s) performed: None  Procedures  Critical Care  performed: No  ____________________________________________   INITIAL IMPRESSION / ASSESSMENT AND PLAN / ED COURSE  Pertinent labs & imaging results that were available during my care of the patient were reviewed by me and considered in my medical decision making (see chart for details).  56 year old female who presents with upper abdominal pain. Laboratory results are unremarkable. Will administer IV analgesics and proceed with abdominal ultrasound.  Clinical Course as of Jun 21 704  Wed Jun 21, 2016  0502 Updated patient of [place] results. Pain returning. History of kidney stones. Will obtain CT renal colic study.  [JS]  [id number] Updated patients on CT imaging results. On review of records, patient has had numerous prior admissions for acute on chronic abdominal pain. 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 Will add sedimentation rate, lactate to aid in patient's disposition. Currently she is resting comfortably in no acute distress.  [JS]  0701 Care transferred to Dr. Cinda Quest pending results of ESR, lactate and reassessment. If results are not significantly elevated and patient is feeling more comfortable, then I anticipate she would be  able to be discharged home with a limited prescription for pain and nausea medications.  [JS]    Clinical Course User Index [JS] Paulette Blanch, MD     ____________________________________________   FINAL CLINICAL IMPRESSION(S) / ED DIAGNOSES  Final diagnoses:  Abdominal pain, unspecified abdominal location      NEW MEDICATIONS STARTED DURING THIS VISIT:  New Prescriptions   No medications on file     Note:  This document was prepared using Dragon voice recognition software and may include unintentional dictation errors.    Paulette Blanch, MD 06/21/16 [phone]

## 2016-06-21 NOTE — ED Triage Notes (Signed)
Pt presents to ED with c/o intermittent abdominal pain radiating to left back accompanied by n/v/d since Sunday. Pt reports pain began after eating dinner, she ate: beans, greens, and macaroni and cheese. Pt denies chest pain or shortness of breath. Pt reports hx of chronic pain in abdomen and back but states symptoms are different. Pt alert and oriented x 4, respirations even and unlabored.

## 2016-06-21 NOTE — Progress Notes (Signed)
CH responded to an OR for an AD. Pt was already educated and wanted the document for completion at a later time. CH left document for her consideration. CH is available for follow up as needed.    06/21/16 1506  Clinical Encounter Type  Visited With Patient  Visit Type Initial;Spiritual support  Referral From Nurse  Spiritual Encounters  Spiritual Needs Literature  Advance Directives (For Healthcare)  Does Patient Have a Medical Advance Directive? No  Would patient like information on creating a medical advance directive? Yes (Inpatient - patient requests chaplain consult to create a medical advance directive)  Mental Health Advance Directives  Does Patient Have a Mental Health Advance Directive? No  Would patient like information on creating a mental health advance directive? No - Patient declined

## 2016-06-21 NOTE — Progress Notes (Signed)
PHARMACIST - PHYSICIAN COMMUNICATION  CONCERNING:  Enoxaparin (Lovenox) for DVT Prophylaxis    RECOMMENDATION: Patient was prescribed enoxaprin 40mg  q24 hours for VTE prophylaxis.   Filed Weights   06/21/16 0004  Weight: 240 lb (108.9 kg)    Body mass index is 42.51 kg/m.  Estimated Creatinine Clearance: 62 mL/min (by C-G formula based on SCr of 1.2 mg/dL (H)).  Based on [facility] policy patient is candidate for enoxaparin 40mg  every 12 hour dosing due to BMI being >40.  DESCRIPTION: Pharmacy has adjusted enoxaparin dose per [facility] policy.  Patient is now receiving enoxaparin 40mg  every 12 hours.   Cher NakaiSheema Kye Silverstein, PharmD Clinical Pharmacist  06/21/2016 1:50 PM

## 2016-06-21 NOTE — H&P (Signed)
[facility] -  at [facility]   PATIENT NAME: Michell Giuliano    MR#:  [id number]  DATE OF BIRTH:  Dec 22, 1960  DATE OF ADMISSION:  06/21/2016  PRIMARY CARE PHYSICIAN: Pcp Not In System  Primary doctor is Dr. Angus Palms REQUESTING/REFERRING PHYSICIAN: Dr. Marge Duncans  CHIEF COMPLAINT: Abdominal pain    Chief Complaint  Patient presents with  . Abdominal Pain    HISTORY OF PRESENT ILLNESS:  Anyeli Hockenbury  is a 56 y.o. female with a  history of fibro myalgia, overactive bladder, GERD, chronic interstitial cystitis, chronic abdominal pains comes in because of left upper quadrant abdominal pain started on Sunday evening /patient describes as abdominal pain in the left upper quadrant  ,10/10 severity , intermittent, stabbing in nature, sometimes going to the back.. Patient also has lot of nausea, vomiting, multiple episodes of diarrhea on Monday. Patient required a lot of IV pain medicines since she came to ER yesterday, concerning this ER physician wanted me to admit for pain control. Patient lab data is unremarkable, CT abdomen showed mesenteritis. Patient is convinced that she needs to stay overnight. Looking at her records patient has history of chronic abdominal pains, follows up with pain medicine clinic at [facility]. Patient received a morphine 8 mg, daily 0.5 mg IV dilaudid in the emergency room without much relief in abdominal pain.  PAST MEDICAL HISTORY:   Past Medical History:  Diagnosis Date  . Chronic pain syndrome   . Fibromyalgia   . Gout   . Hypertension   . IC (interstitial cystitis)   . Spinal stenosis     PAST SURGICAL HISTOIRY:   Past Surgical History:  Procedure Laterality Date  . ABDOMINAL HYSTERECTOMY    . APPENDECTOMY    . BLADDER SUSPENSION    . ECTOPIC PREGNANCY SURGERY    . meniscus tear    . ROTATOR CUFF REPAIR    . THYROIDECTOMY    . TUBAL LIGATION      SOCIAL HISTORY:   Social History  Substance Use Topics   . Smoking status: Never Smoker  . Smokeless tobacco: Never Used  . Alcohol use Yes    FAMILY HISTORY:  No family history on file.  DRUG ALLERGIES:   Allergies  Allergen Reactions  . Amoxicillin Nausea And Vomiting  . Azithromycin Nausea And Vomiting  . Ciprofloxacin Nausea And Vomiting    "Cannot take by mouth, OK by IV"  . Hydrocodone Nausea And Vomiting  . Percocet [Oxycodone-Acetaminophen] Nausea And Vomiting  . Sulfa Antibiotics Nausea And Vomiting    REVIEW OF SYSTEMS:  CONSTITUTIONAL: No fever, fatigue or weakness.  EYES: No blurred or double vision.  EARS, NOSE, AND THROAT: No tinnitus or ear pain.  RESPIRATORY: No cough, shortness of breath, wheezing or hemoptysis.  CARDIOVASCULAR: No chest pain, orthopnea, edema.  GASTROINTESTINAL: Nausea, vomiting, left upper quadrant abdominal pain, diarrhea. Patient says that last stool was yesterday.  GENITOURINARY: No dysuria, hematuria.  ENDOCRINE: No polyuria, nocturia,  HEMATOLOGY: No anemia, easy bruising or bleeding SKIN: No rash or lesion. MUSCULOSKELETAL: No joint pain or arthritis.   NEUROLOGIC: No tingling, numbness, weakness.  PSYCHIATRY: No anxiety or depression.   MEDICATIONS AT HOME:   Prior to Admission medications   Medication Sig Start Date End Date Taking? Authorizing Provider  amitriptyline (ELAVIL) 50 MG tablet Take 50 mg by mouth at bedtime. 02/21/16 02/20/17 Yes Historical Provider, MD  aspirin EC 81 MG tablet Take 81 mg by mouth daily. 02/23/14  Yes  Historical Provider, MD  cyclobenzaprine (FLEXERIL) 10 MG tablet Take 1 tablet (10 mg total) by mouth every 8 (eight) hours as needed for muscle spasms. 04/26/15  Yes Charmayne Sheer Beers, PA-C  diclofenac (FLECTOR) 1.3 % PTCH Place 1 patch onto the skin 2 (two) times daily.   Yes Historical Provider, MD  diclofenac (VOLTAREN) 0.1 % ophthalmic solution Place 1 drop into the left eye 4 (four) times daily. 06/14/16 06/24/16 Yes Historical Provider, MD  diclofenac  sodium (VOLTAREN) 1 % GEL Apply 2 g topically 4 (four) times daily as needed.   Yes Historical Provider, MD  Hyoscyamine Sulfate SL 0.125 MG SUBL Place 0.125 mg under the tongue every 6 (six) hours as needed.   Yes Historical Provider, MD  levothyroxine (SYNTHROID, LEVOTHROID) 200 MCG tablet Take 225 mcg by mouth daily. 03/20/16  Yes Historical Provider, MD  lisinopril (PRINIVIL,ZESTRIL) 10 MG tablet Take 10 mg by mouth daily.   Yes Historical Provider, MD  mirabegron ER (MYRBETRIQ) 50 MG TB24 tablet Take 50 mg by mouth daily.   Yes Historical Provider, MD  pregabalin (LYRICA) 100 MG capsule Take 100 mg by mouth 3 (three) times daily. 04/11/16  Yes Historical Provider, MD  promethazine (PHENERGAN) 12.5 MG tablet Take 1 tablet (12.5 mg total) by mouth every 6 (six) hours as needed for nausea or vomiting. 05/30/15  Yes Minna Antis, MD  ranitidine (ZANTAC) 300 MG tablet Take 300 mg by mouth at bedtime. 03/09/16 03/09/17 Yes Historical Provider, MD  tiZANidine (ZANAFLEX) 2 MG tablet Take 2 mg by mouth every 8 (eight) hours as needed for muscle spasms. 04/11/16  Yes Historical Provider, MD  ibuprofen (ADVIL,MOTRIN) 800 MG tablet Take 1 tablet (800 mg total) by mouth every 8 (eight) hours as needed. Patient not taking: Reported on 06/21/2016 04/26/15   Charmayne Sheer Beers, PA-C  oxyCODONE-acetaminophen (ROXICET) 5-325 MG tablet Take 1 tablet by mouth every 6 (six) hours as needed. Patient not taking: Reported on 06/21/2016 05/30/15   Minna Antis, MD      VITAL SIGNS:  Blood pressure 116/88, pulse 78, temperature 98.5 F (36.9 C), temperature source Oral, resp. rate 18, height 5\' 3"  (1.6 m), weight 108.9 kg (240 lb), SpO2 92 %.  PHYSICAL EXAMINATION:  GENERAL:  56 y.o.-year-old patient lying in the bed with no acute distress.  EYES: Pupils equal, round, reactive to light and accommodation. No scleral icterus. Extraocular muscles intact.  HEENT: Head atraumatic, normocephalic. Oropharynx and  nasopharynx clear.  NECK:  Supple, no jugular venous distention. No thyroid enlargement, no tenderness.  LUNGS: Normal breath sounds bilaterally, no wheezing, rales,rhonchi or crepitation. No use of accessory muscles of respiration.  CARDIOVASCULAR: S1, S2 normal. No murmurs, rubs, or gallops.  ABDOMEN: has  left upper quadrant tenderness. No rebound tenderness, nondistended. Bowel sounds present. No organomegaly or mass.  EXTREMITIES: No pedal edema, cyanosis, or clubbing.  NEUROLOGIC: Cranial nerves II through XII are intact. Muscle strength 5/5 in all extremities. Sensation intact. Gait not checked.  PSYCHIATRIC: The patient is alert and oriented x 3.  SKIN: No obvious rash, lesion, or ulcer.   LABORATORY PANEL:   CBC  Recent Labs Lab 06/21/16 0024  WBC 8.8  HGB 12.5  HCT 36.6  PLT 281   ------------------------------------------------------------------------------------------------------------------  Chemistries   Recent Labs Lab 06/21/16 0024  NA 140  K 3.9  CL 107  CO2 28  GLUCOSE 119*  BUN 17  CREATININE 1.20*  CALCIUM 8.9  AST 16  ALT 11*  ALKPHOS 75  BILITOT 0.3   ------------------------------------------------------------------------------------------------------------------  Cardiac Enzymes No results for input(s): TROPONINI in the last 168 hours. ------------------------------------------------------------------------------------------------------------------  RADIOLOGY:  Ct Renal Stone Study  Result Date: 06/21/2016 CLINICAL DATA:  Left flank and left lower quadrant pain. EXAM: CT ABDOMEN AND PELVIS WITHOUT CONTRAST TECHNIQUE: Multidetector CT imaging of the abdomen and pelvis was performed following the standard protocol without IV contrast. COMPARISON:  Ultrasound right upper quadrant 06/21/2016. CT abdomen and pelvis 06/15/2014. FINDINGS: Lower chest: Slight fibrosis in the lung bases. Small esophageal hiatal hernia. Hepatobiliary: No focal liver  abnormality is seen. No gallstones, gallbladder wall thickening, or biliary dilatation. Pancreas: Unremarkable. No pancreatic ductal dilatation or surrounding inflammatory changes. Spleen: Normal in size without focal abnormality. Adrenals/Urinary Tract: No adrenal gland nodules. Small punctate stones in the right kidney. Small stones in the left kidney, largest in the lower pole measuring 4.5 mm in diameter. No hydronephrosis or hydroureter. No ureteral stones or bladder stones identified. Bladder wall is not thickened. Stomach/Bowel: Stomach and small bowel are decompressed. Diffusely stool-filled colon without distention. Scattered colonic diverticula. No inflammatory changes. Appendix is surgically absent. Vascular/Lymphatic: Normal caliber abdominal aorta and inferior vena cava. No significant lymphadenopathy. There is mild hazy infiltration at the root of the mesenteric, more prominent than on previous study, suggesting possible inflammatory process. This could indicate mesenteritis. No loculated collections. Reproductive: Status post hysterectomy. No adnexal masses. Other: No abdominal wall hernia or abnormality. No abdominopelvic ascites. Musculoskeletal: Degenerative changes in the spine and hips. No destructive bone lesions. Stimulator device in the left sacrum. IMPRESSION: Hazy infiltration of the mesenteric could indicate mesenteritis. Nonobstructing bilateral renal stones. Small esophageal hiatal hernia. Electronically Signed   By: Burman NievesWilliam  Stevens M.D.   On: 06/21/2016 06:11   [place] Abdomen Limited Ruq  Result Date: 06/21/2016 CLINICAL DATA:  Epigastric pain for 3 days. EXAM: US ABDOMEN LIMITED - RIGHT UPPER QUADRANT COMPARISON:  CT abdomen and pelvis 06/15/2014 FINDINGS: Gallbladder: No gallstones or wall thickening visualized. No sonographic Murphy sign noted by sonographer. Common bile duct: Diameter: 4.5 mm, normal Liver: No focal lesion identified. Within normal limits in parenchymal echogenicity.  IMPRESSION: Normal examination. Electronically Signed   By: Burman NievesWilliam  Stevens M.D.   On: 06/21/2016 04:06    EKG:   Orders placed or performed in visit on 06/14/14  . EKG 12-Lead    IMPRESSION AND PLAN:   56 year old female patient with multiple medical problems of fibromyalgia, hypothyroidism, essential hypertension, sciatica, comes in with abdominal pain in left upper quadrant requiring multiple doses of IV pain medicines, nausea vomiting diarrhea 1/ abdominal pain with nausea, vomiting , with  mesenteritis. Admit observation status for overnight for pain control., IV fluids, IV nausea medicines, check stool for C. difficile, GI panel with PCR, continue IV PPIs. Possible narcotic seeking behavior . reviewed  charts from pain management.  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  All the records are reviewed and case discussed with ED provider. Management plans discussed with the patient, family and they are in agreement.  CODE STATUS: full  TOTAL TIME TAKING CARE OF THIS PATIENT: 55 minutes.    Katha HammingKONIDENA,Kadesha Virrueta M.D on 06/21/2016 at 12:16 PM  Between 7am to 6pm - Pager - [phone]  After 6pm go to [email or web address] - password [email or web address]  [facility]  Office  [phone]  CC: Primary care physician; Pcp Not In System  Note: This dictation was prepared with Dragon dictation along with smaller phrase technology. Any transcriptional errors that result from this process are unintentional.

## 2016-06-21 NOTE — ED Notes (Signed)
Attempted to call report; RN in pt room at this time.  Name and ascom number for RN to call me back.

## 2016-06-21 NOTE — ED Provider Notes (Signed)
Unable to control patient pain well. Will admit her for pain control.   Joanna James Malinda, MD 06/21/16 [phone]

## 2016-06-21 NOTE — ED Notes (Addendum)
Attempted IV start to left hand and rt FA (at pt location request) with 22GA cath without success; pt tolerated well; charge notified to assist

## 2016-06-21 NOTE — ED Notes (Signed)
Pt to u/s via stretcher accomp by u/s tech 

## 2016-06-22 DIAGNOSIS — R109 Unspecified abdominal pain: Secondary | ICD-10-CM | POA: Diagnosis not present

## 2016-06-22 LAB — CBC
HEMATOCRIT: 35.8 % (ref 35.0–47.0)
Hemoglobin: 11.9 g/dL — ABNORMAL LOW (ref 12.0–16.0)
MCH: 29 pg (ref 26.0–34.0)
MCHC: 33.1 g/dL (ref 32.0–36.0)
MCV: 87.5 fL (ref 80.0–100.0)
Platelets: 261 10*3/uL (ref 150–440)
RBC: 4.09 MIL/uL (ref 3.80–5.20)
RDW: 13.7 % (ref 11.5–14.5)
WBC: 6.4 10*3/uL (ref 3.6–11.0)

## 2016-06-22 LAB — BASIC METABOLIC PANEL
Anion gap: 5 (ref 5–15)
BUN: 17 mg/dL (ref 6–20)
CHLORIDE: 108 mmol/L (ref 101–111)
CO2: 26 mmol/L (ref 22–32)
Calcium: 8.5 mg/dL — ABNORMAL LOW (ref 8.9–10.3)
Creatinine, Ser: 1.14 mg/dL — ABNORMAL HIGH (ref 0.44–1.00)
GFR calc Af Amer: >60 mL/min (ref >60)
GFR calc non Af Amer: 53 mL/min — ABNORMAL LOW (ref >60)
GLUCOSE: 101 mg/dL — AB (ref 65–99)
Potassium: 4.1 mmol/L (ref 3.5–5.1)
Sodium: 139 mmol/L (ref 135–145)

## 2016-06-22 LAB — GLUCOSE, CAPILLARY
GLUCOSE-CAPILLARY: 87 mg/dL (ref 65–99)
Glucose-Capillary: 120 mg/dL — ABNORMAL HIGH (ref 65–99)

## 2016-06-22 MED ORDER — TRAMADOL HCL 50 MG PO TABS
50.0000 mg | ORAL_TABLET | Freq: Three times a day (TID) | ORAL | Status: DC | PRN
  Administered 2016-06-22: 50 mg via ORAL
  Filled 2016-06-22: qty 1

## 2016-06-22 MED ORDER — PANTOPRAZOLE SODIUM 40 MG PO TBEC
40.0000 mg | DELAYED_RELEASE_TABLET | Freq: Every day | ORAL | Status: DC
  Administered 2016-06-22: 13:00:00 40 mg via ORAL
  Filled 2016-06-22: qty 1

## 2016-06-22 MED ORDER — TRAMADOL HCL 50 MG PO TABS: 50.0000 mg | ORAL_TABLET | Freq: Three times a day (TID) | ORAL | 0 refills | Status: DC | PRN

## 2016-06-22 NOTE — Discharge Instructions (Signed)
Pt advised to f/u with [facility] and pain clinic on her appt

## 2016-06-22 NOTE — Progress Notes (Signed)
Pt is being discharged today, IV x1 removed, pt belongings packed and returned to her. Discharge instructions reviewed with patient, she verified understanding. She is currently waiting on her ride, she will be wheeled out in a wheelchair by staff when her ride arrives.

## 2016-06-22 NOTE — Discharge Summary (Signed)
[facility] - [place] at [facility]   PATIENT NAME: Joanna KohCassandra Stegeman    MR#:  [id number]  DATE OF BIRTH:  10/21/1960  DATE OF ADMISSION:  06/21/2016 ADMITTING PHYSICIAN: Katha HammingSnehalatha Konidena, MD  DATE OF DISCHARGE: 06/22/16  PRIMARY CARE PHYSICIAN: Pcp Not In System    ADMISSION DIAGNOSIS:  Abdominal pain, unspecified abdominal location [R10.9]  DISCHARGE DIAGNOSIS:  Chronic abdominal pain  SECONDARY DIAGNOSIS:   Past Medical History:  Diagnosis Date  . Chronic pain syndrome   . Fibromyalgia   . Gout   . Hypertension   . IC (interstitial cystitis)   . Spinal stenosis     HOSPITAL COURSE:  56 year old female patient with multiple medical problems of fibromyalgia, hypothyroidism, essential hypertension, sciatica, comes in with abdominal pain in left upper quadrant requiring multiple doses of IV pain medicines, nausea vomiting diarrhea 1/ abdominal pain with nausea, vomiting -CT abdomen? Small area of  mesenteritis. -pain control., IV fluids, IV nausea medicines -eating well. 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 Amoxicillin Nausea And Vomiting  . Azithromycin Nausea And Vomiting  . Ciprofloxacin Nausea And Vomiting    "Cannot take by mouth, OK by IV"  . Hydrocodone Nausea And Vomiting  . Percocet [Oxycodone-Acetaminophen] Nausea And Vomiting  . Sulfa Antibiotics Nausea And Vomiting    DISCHARGE MEDICATIONS:   Current Discharge Medication List    START taking these medications   Details  traMADol (ULTRAM) 50 MG tablet Take 1 tablet (50 mg total) by mouth 3  (three) times daily as needed for moderate pain or severe pain. Qty: 20 tablet, Refills: 0      CONTINUE these medications which have NOT CHANGED   Details  amitriptyline (ELAVIL) 50 MG tablet Take 50 mg by mouth at bedtime.    Apremilast 10 & 20 & 30 MG TBPK Take 10 mg by mouth every morning.    aspirin EC 81 MG tablet Take 81 mg by mouth daily.    cyclobenzaprine (FLEXERIL) 10 MG tablet Take 1 tablet (10 mg total) by mouth every 8 (eight) hours as needed for muscle spasms. Qty: 30 tablet, Refills: 1    diclofenac (FLECTOR) 1.3 % PTCH Place 1 patch onto the skin 2 (two) times daily.    diclofenac (VOLTAREN) 0.1 % ophthalmic solution Place 1 drop into the left eye 4 (four) times daily.    diclofenac sodium (VOLTAREN) 1 % GEL Apply 2 g topically 4 (four) times daily as needed.    hydrOXYzine (ATARAX/VISTARIL) 25 MG tablet Take 25 mg by mouth 2 (two) times daily.    Hyoscyamine Sulfate SL 0.125 MG SUBL Place 0.125 mg under the tongue every 6 (six) hours as needed.    levothyroxine (SYNTHROID, LEVOTHROID) 200 MCG tablet Take 225 mcg by mouth daily.    lisinopril (PRINIVIL,ZESTRIL) 10 MG tablet Take 10 mg by mouth daily.    mirabegron ER (MYRBETRIQ) 50 MG TB24 tablet Take 50 mg by mouth daily.    pentosan polysulfate (ELMIRON) 100 MG capsule Take 100 mg by mouth 3 (three) times daily.    pregabalin (LYRICA) 100 MG capsule Take  100 mg by mouth 3 (three) times daily.    promethazine (PHENERGAN) 12.5 MG tablet Take 1 tablet (12.5 mg total) by mouth every 6 (six) hours as needed for nausea or vomiting. Qty: 15 tablet, Refills: 0    ranitidine (ZANTAC) 300 MG tablet Take 300 mg by mouth at bedtime.    tiZANidine (ZANAFLEX) 2 MG tablet Take 2 mg by mouth every 8 (eight) hours as needed for muscle spasms.        If you experience worsening of your admission symptoms, develop shortness of breath, life threatening emergency, suicidal or homicidal thoughts you must seek medical  attention immediately by calling 911 or calling your MD immediately  if symptoms less severe.  You Must read complete instructions/literature along with all the possible adverse reactions/side effects for all the Medicines you take and that have been prescribed to you. Take any new Medicines after you have completely understood and accept all the possible adverse reactions/side effects.   Please note  You were cared for by a hospitalist during your hospital stay. If you have any questions about your discharge medications or the care you received while you were in the hospital after you are discharged, you can call the unit and asked to speak with the hospitalist on call if the hospitalist that took care of you is not available. Once you are discharged, your primary care physician will handle any further medical issues. Please note that NO REFILLS for any discharge medications will be authorized once you are discharged, as it is imperative that you return to your primary care physician (or establish a relationship with a primary care physician if you do not have one) for your aftercare needs so that they can reassess your need for medications and monitor your lab values. Today   SUBJECTIVE   Mild diffuse abdominal pain Eating ok  VITAL SIGNS:  Blood pressure 125/67, pulse 73, temperature 97.9 F (36.6 C), temperature source Oral, resp. rate 16, height 5\' 3"  (1.6 m), weight 114.8 kg (253 lb 3 oz), SpO2 100 %.  I/O:   Intake/Output Summary (Last 24 hours) at 06/22/16 1404 Last data filed at 06/22/16 1014  Gross per 24 hour  Intake           1047.5 ml  Output              100 ml  Net            947.5 ml    PHYSICAL EXAMINATION:  GENERAL:  56 y.o.-year-old patient lying in the bed with no acute distress. obese EYES: Pupils equal, round, reactive to light and accommodation. No scleral icterus. Extraocular muscles intact.  HEENT: Head atraumatic, normocephalic. Oropharynx and nasopharynx  clear.  NECK:  Supple, no jugular venous distention. No thyroid enlargement, no tenderness.  LUNGS: Normal breath sounds bilaterally, no wheezing, rales,rhonchi or crepitation. No use of accessory muscles of respiration.  CARDIOVASCULAR: S1, S2 normal. No murmurs, rubs, or gallops.  ABDOMEN: Soft, non-tender, non-distended. Bowel sounds present. No organomegaly or mass. Obese. Abdominal surgical scars+ EXTREMITIES: No pedal edema, cyanosis, or clubbing.  NEUROLOGIC: Cranial nerves II through XII are intact. Muscle strength 5/5 in all extremities. Sensation intact. Gait not checked.  PSYCHIATRIC: The patient is alert and oriented x 3.  SKIN: No obvious rash, lesion, or ulcer.   DATA REVIEW:   CBC   Recent Labs Lab 06/22/16 0606  WBC 6.4  HGB 11.9*  HCT 35.8  PLT 261    Chemistries  Recent Labs Lab 06/21/16 0024 06/22/16 0606  NA 140 139  K 3.9 4.1  CL 107 108  CO2 28 26  GLUCOSE 119* 101*  BUN 17 17  CREATININE 1.20* 1.14*  CALCIUM 8.9 8.5*  AST 16  --   ALT 11*  --   ALKPHOS 75  --   BILITOT 0.3  --     Microbiology Results   No results found for this or any previous visit (from the past 240 hour(s)).  RADIOLOGY:  Ct Renal Stone Study  Result Date: 06/21/2016 CLINICAL DATA:  Left flank and left lower quadrant pain. EXAM: CT ABDOMEN AND PELVIS WITHOUT CONTRAST TECHNIQUE: Multidetector CT imaging of the abdomen and pelvis was performed following the standard protocol without IV contrast. COMPARISON:  Ultrasound right upper quadrant 06/21/2016. CT abdomen and pelvis 06/15/2014. FINDINGS: Lower chest: Slight fibrosis in the lung bases. Small esophageal hiatal hernia. Hepatobiliary: No focal liver abnormality is seen. No gallstones, gallbladder wall thickening, or biliary dilatation. Pancreas: Unremarkable. No pancreatic ductal dilatation or surrounding inflammatory changes. Spleen: Normal in size without focal abnormality. Adrenals/Urinary Tract: No adrenal gland  nodules. Small punctate stones in the right kidney. Small stones in the left kidney, largest in the lower pole measuring 4.5 mm in diameter. No hydronephrosis or hydroureter. No ureteral stones or bladder stones identified. Bladder wall is not thickened. Stomach/Bowel: Stomach and small bowel are decompressed. Diffusely stool-filled colon without distention. Scattered colonic diverticula. No inflammatory changes. Appendix is surgically absent. Vascular/Lymphatic: Normal caliber abdominal aorta and inferior vena cava. No significant lymphadenopathy. There is mild hazy infiltration at the root of the mesenteric, more prominent than on previous study, suggesting possible inflammatory process. This could indicate mesenteritis. No loculated collections. Reproductive: Status post hysterectomy. No adnexal masses. Other: No abdominal wall hernia or abnormality. No abdominopelvic ascites. Musculoskeletal: Degenerative changes in the spine and hips. No destructive bone lesions. Stimulator device in the left sacrum. IMPRESSION: Hazy infiltration of the mesenteric could indicate mesenteritis. Nonobstructing bilateral renal stones. Small esophageal hiatal hernia. Electronically Signed   By: Burman Nieves M.D.   On: 06/21/2016 06:11   US Abdomen Limited Ruq  Result Date: 06/21/2016 CLINICAL DATA:  Epigastric pain for 3 days. EXAM: US ABDOMEN LIMITED - RIGHT UPPER QUADRANT COMPARISON:  CT abdomen and pelvis 06/15/2014 FINDINGS: Gallbladder: No gallstones or wall thickening visualized. No sonographic Murphy sign noted by sonographer. Common bile duct: Diameter: 4.5 mm, normal Liver: No focal lesion identified. Within normal limits in parenchymal echogenicity. IMPRESSION: Normal examination. Electronically Signed   By: Burman Nieves M.D.   On: 06/21/2016 04:06     Management plans discussed with the patient, family and they are in agreement.  CODE STATUS:     Code Status Orders        Start     Ordered    06/21/16 1043  Full code  Continuous     06/21/16 1042    Code Status History    Date Active Date Inactive Code Status Order ID Comments User Context   This patient has a current code status but no historical code status.      TOTAL TIME TAKING CARE OF THIS PATIENT: 40 minutes.    Eulala Newcombe M.D on 06/22/2016 at 2:04 PM  Between 7am to 6pm - Pager - [phone] After 6pm go to [email or web address] - password [email or web address]  [facility]  Office  [phone]  CC: Primary care physician; Pcp Not In System

## 2016-07-07 DIAGNOSIS — K654 Sclerosing mesenteritis: Secondary | ICD-10-CM | POA: Insufficient documentation

## 2016-08-10 ENCOUNTER — Emergency Department: Payer: Medicare Other

## 2016-08-10 ENCOUNTER — Emergency Department
Admission: EM | Admit: 2016-08-10 | Discharge: 2016-08-10 | Disposition: A | Discharge status: Home / Self Care | Payer: Medicare Other | Attending: Emergency Medicine | Admitting: Emergency Medicine

## 2016-08-10 ENCOUNTER — Encounter: Payer: Self-pay | Admitting: Emergency Medicine

## 2016-08-10 DIAGNOSIS — Z79899 Other long term (current) drug therapy: Secondary | ICD-10-CM | POA: Insufficient documentation

## 2016-08-10 DIAGNOSIS — Z7982 Long term (current) use of aspirin: Secondary | ICD-10-CM | POA: Diagnosis not present

## 2016-08-10 DIAGNOSIS — R1032 Left lower quadrant pain: Secondary | ICD-10-CM | POA: Diagnosis present

## 2016-08-10 DIAGNOSIS — I10 Essential (primary) hypertension: Secondary | ICD-10-CM | POA: Diagnosis not present

## 2016-08-10 DIAGNOSIS — R109 Unspecified abdominal pain: Secondary | ICD-10-CM

## 2016-08-10 LAB — COMPREHENSIVE METABOLIC PANEL
ALBUMIN: 4.1 g/dL (ref 3.5–5.0)
ALT: 13 U/L — ABNORMAL LOW (ref 14–54)
ANION GAP: 7 (ref 5–15)
AST: 23 U/L (ref 15–41)
Alkaline Phosphatase: 63 U/L (ref 38–126)
BILIRUBIN TOTAL: 1.1 mg/dL (ref 0.3–1.2)
BUN: 16 mg/dL (ref 6–20)
CO2: 27 mmol/L (ref 22–32)
Calcium: 9.3 mg/dL (ref 8.9–10.3)
Chloride: 105 mmol/L (ref 101–111)
Creatinine, Ser: 1.48 mg/dL — ABNORMAL HIGH (ref 0.44–1.00)
GFR calc Af Amer: 45 mL/min — ABNORMAL LOW (ref >60)
GFR, EST NON AFRICAN AMERICAN: 38 mL/min — AB (ref >60)
Glucose, Bld: 129 mg/dL — ABNORMAL HIGH (ref 65–99)
POTASSIUM: 4.1 mmol/L (ref 3.5–5.1)
Sodium: 139 mmol/L (ref 135–145)
TOTAL PROTEIN: 7.8 g/dL (ref 6.5–8.1)

## 2016-08-10 LAB — CBC WITH DIFFERENTIAL/PLATELET
BASOS ABS: 0.1 10*3/uL (ref 0–0.1)
Basophils Relative: 0 %
Eosinophils Absolute: 0.1 10*3/uL (ref 0–0.7)
Eosinophils Relative: 1 %
HEMATOCRIT: 40.3 % (ref 35.0–47.0)
Hemoglobin: 13.2 g/dL (ref 12.0–16.0)
LYMPHS PCT: 11 %
Lymphs Abs: 1.4 10*3/uL (ref 1.0–3.6)
MCH: 28.2 pg (ref 26.0–34.0)
MCHC: 32.9 g/dL (ref 32.0–36.0)
MCV: 85.7 fL (ref 80.0–100.0)
Monocytes Absolute: 0.5 10*3/uL (ref 0.2–0.9)
Monocytes Relative: 4 %
NEUTROS ABS: 11.2 10*3/uL — AB (ref 1.4–6.5)
NEUTROS PCT: 84 %
PLATELETS: 295 10*3/uL (ref 150–440)
RBC: 4.7 MIL/uL (ref 3.80–5.20)
RDW: 13.5 % (ref 11.5–14.5)
WBC: 13.3 10*3/uL — AB (ref 3.6–11.0)

## 2016-08-10 LAB — URINALYSIS, COMPLETE (UACMP) WITH MICROSCOPIC
Bacteria, UA: NONE SEEN
Bilirubin Urine: NEGATIVE
Glucose, UA: NEGATIVE mg/dL
Ketones, ur: NEGATIVE mg/dL
Nitrite: NEGATIVE
PH: 5 (ref 5.0–8.0)
Protein, ur: 100 mg/dL — AB
SPECIFIC GRAVITY, URINE: 1.021 (ref 1.005–1.030)

## 2016-08-10 MED ORDER — KETOROLAC TROMETHAMINE 30 MG/ML IJ SOLN
15.0000 mg | Freq: Once | INTRAMUSCULAR | Status: AC
  Administered 2016-08-10: 15 mg via INTRAVENOUS
  Filled 2016-08-10: qty 1

## 2016-08-10 MED ORDER — ONDANSETRON HCL 4 MG/2ML IJ SOLN
4.0000 mg | Freq: Once | INTRAMUSCULAR | Status: AC
  Administered 2016-08-10: 4 mg via INTRAVENOUS
  Filled 2016-08-10: qty 2

## 2016-08-10 MED ORDER — LIDOCAINE 5 % EX PTCH
1.0000 | MEDICATED_PATCH | CUTANEOUS | Status: DC
  Administered 2016-08-10: 1 via TRANSDERMAL
  Filled 2016-08-10 (×2): qty 1

## 2016-08-10 MED ORDER — CEPHALEXIN 500 MG PO CAPS: 500.0000 mg | ORAL_CAPSULE | Freq: Two times a day (BID) | ORAL | 0 refills | Status: DC

## 2016-08-10 MED ORDER — LORAZEPAM 2 MG/ML IJ SOLN
1.0000 mg | Freq: Once | INTRAMUSCULAR | Status: AC
  Administered 2016-08-10: 1 mg via INTRAVENOUS
  Filled 2016-08-10: qty 1

## 2016-08-10 MED ORDER — SODIUM CHLORIDE 0.9 % IV BOLUS (SEPSIS)
500.0000 mL | Freq: Once | INTRAVENOUS | Status: AC
  Administered 2016-08-10: 500 mL via INTRAVENOUS

## 2016-08-10 MED ORDER — HYDROMORPHONE HCL 2 MG PO TABS
2.0000 mg | ORAL_TABLET | Freq: Once | ORAL | Status: AC
  Administered 2016-08-10: 2 mg via ORAL
  Filled 2016-08-10: qty 1

## 2016-08-10 MED ORDER — HYDROMORPHONE HCL 1 MG/ML IJ SOLN
1.0000 mg | Freq: Once | INTRAMUSCULAR | Status: AC
  Administered 2016-08-10: 1 mg via INTRAVENOUS
  Filled 2016-08-10: qty 1

## 2016-08-10 NOTE — ED Triage Notes (Signed)
Per [facility] patient comes from home with c/o left flank pain. Patient had a stent placed on Monday for same. Patient removed the stent herself today. Patient moaning in traige.

## 2016-08-10 NOTE — Discharge Instructions (Signed)
Please contact your urologist either later today or tomorrow for follow-up as soon as possible. Return to the emergency department especially for fever, persistent vomiting, or any new concerns. Please take a stool softener for what appears to be some constipation per CAT scan evaluation. Continue with Tylenol and Ultram for pain

## 2016-08-10 NOTE — ED Provider Notes (Signed)
Time Seen: Approximately 1250  I have reviewed the triage notes  Chief Complaint: Flank Pain   History of Present Illness: Joanna James is a 56 y.o. female *who was transported here by EMS with complaints of acute onset of left-sided flank and abdominal pain. Patient's had recent cystoscopy with left-sided stent placement at [facility]. Patient states that she took her stent out earlier today. Patient started having pain acutely approximately an hour prior to arrival. States she took a tramadol at home without any symptomatic relief.   Past Medical History:  Diagnosis Date  . Chronic pain syndrome   . Fibromyalgia   . Gout   . Hypertension   . IC (interstitial cystitis)   . Spinal stenosis     Patient Active Problem List   Diagnosis Date Noted  . Abdominal pain 06/21/2016    Past Surgical History:  Procedure Laterality Date  . ABDOMINAL HYSTERECTOMY    . APPENDECTOMY    . BLADDER SUSPENSION    . ECTOPIC PREGNANCY SURGERY    . meniscus tear    . ROTATOR CUFF REPAIR    . THYROIDECTOMY    . TUBAL LIGATION      Past Surgical History:  Procedure Laterality Date  . ABDOMINAL HYSTERECTOMY    . APPENDECTOMY    . BLADDER SUSPENSION    . ECTOPIC PREGNANCY SURGERY    . meniscus tear    . ROTATOR CUFF REPAIR    . THYROIDECTOMY    . TUBAL LIGATION      Current Outpatient Rx  . Order #: [id number] Class: Historical Med  . Order #: [id number] Class: Historical Med  . Order #: [id number] Class: Historical Med  . Order #: [id number] Class: Print  . Order #: [id number] Class: Historical Med  . Order #: [id number] Class: Historical Med  . Order #: [id number] Class: Historical Med  . Order #: [id number] Class: Historical Med  . Order #: [id number] Class: Historical Med  . Order #: [id number] Class: Historical Med  . Order #: [id number] Class: Historical Med  . Order #: [id number] Class: Historical Med  . Order #: [id number] Class: Historical Med  . Order #:  [id number] Class: Print  . Order #: [id number] Class: Historical Med  . Order #: [id number] Class: Historical Med  . Order #: [id number] Class: Normal    Allergies:  Amoxicillin; Azithromycin; Ciprofloxacin; Hydrocodone; Percocet [oxycodone-acetaminophen]; and Sulfa antibiotics  Family History: No family history on file.  Social History: Social History  Substance Use Topics  . Smoking status: Never Smoker  . Smokeless tobacco: Never Used  . Alcohol use Yes     Review of Systems:   10 point review of systems was performed and was otherwise negative: Patient's difficult historian. Constitutional: No fever at home Eyes: No visual disturbances ENT: No sore throat, ear pain Cardiac: No chest pain Respiratory: No shortness of breath, wheezing, or stridor Abdomen: Pain is left-sided flank and abdominal area with no associated vomiting though nauseated. No loose stool or diarrhea. Endocrine: No weight loss, No night sweats Extremities: No peripheral edema, cyanosis Skin: No rashes, easy bruising Neurologic: No focal weakness, trouble with speech or swollowing Urologic: Patient's not urinated since she removed the stent   Physical Exam:  ED Triage Vitals  Enc Vitals Group     BP 08/10/16 1243 (!) 172/94     Pulse Rate 08/10/16 1243 72     Resp 08/10/16 1243 20     Temp 08/10/16 1243 98.1 F (36.7 C)     Temp Source 08/10/16 1243 Oral     SpO2  08/10/16 1243 98 %     Weight 08/10/16 1240 240 lb (108.9 kg)     Height 08/10/16 1240 5\' 3"  (1.6 m)     Head Circumference --      Peak Flow --      Pain Score 08/10/16 1240 10     Pain Loc --      Pain Edu? --      Excl. in GC? --     General: Awake , Alert , and Oriented times 3; GCS 15 Head: Normal cephalic , atraumatic Eyes: Pupils equal , round, reactive to light Nose/Throat: No nasal drainage, patent upper airway without erythema or exudate.  Neck: Supple, Full range of motion, No anterior adenopathy or palpable thyroid  masses Lungs: Clear to ascultation without wheezes , rhonchi, or rales Heart: Regular rate, regular rhythm without murmurs , gallops , or rubs Abdomen: Soft, non tender without rebound, guarding , or rigidity; bowel sounds positive and symmetric in all 4 quadrants. No organomegaly .        Extremities: 2 plus symmetric pulses. No edema, clubbing or cyanosis Neurologic: normal ambulation, Motor symmetric without deficits, sensory intact Skin: warm, dry, no rashes   Labs:   All laboratory work was reviewed including any pertinent negatives or positives listed below:  Labs Reviewed  COMPREHENSIVE METABOLIC PANEL  CBC WITH DIFFERENTIAL/PLATELET  Mild stable renal insufficiency  Urine shows obvious findings from having a recent stent. Numerous white blood cells and red blood cells in the urine    Radiology:  "Ct Renal Stone Study  Result Date: 08/10/2016 CLINICAL DATA:  Acute onset of left lower quadrant pain for 2 hours, possible mesenteric inflammation on recent CT scan EXAM: CT ABDOMEN AND PELVIS WITHOUT CONTRAST TECHNIQUE: Multidetector CT imaging of the abdomen and pelvis was performed following the standard protocol without IV contrast. COMPARISON:  CT scan 06/21/2016 and 06/15/2014 FINDINGS: Lower chest: Lung bases shows no acute findings. Small hiatal hernia is stable. Hepatobiliary: Unenhanced liver shows no biliary ductal dilatation. No calcified gallstones are noted within gallbladder. Pancreas: Unenhanced pancreas without focal abnormality. Spleen: Unenhanced spleen without focal abnormality. Adrenals/Urinary Tract: No adrenal gland mass. Again noted nonobstructive calcification in lower pole of the right kidney measures 1.8 mm. Again noted nonobstructive calcified calculus in lower pole of the left kidney measures 4 mm. There is mild left hydronephrosis and left hydroureter. Mild left perinephric and periureteral stranding. No calcified left ureteral calculi are noted. Findings may  represent a recent passed left ureteral calculus or left urinary tract infection/ inflammation. No calcified calculi are identified within under distended urinary bladder. Stomach/Bowel: No small bowel obstruction. No pericecal inflammation. Abundant stool noted within cecum. There is a low lying cecum. The appendix is not identified. 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 Moderate stool noted within right colon. Moderate stool noted within hepatic flexure of the colon. Some colonic stool noted within transverse colon. Descending colon is empty collapsed. Scattered diverticula are noted descending colon and proximal sigmoid colon. Sigmoid colon is empty collapsed. No evidence of distal colitis or diverticulitis. Some colonic gas noted in mid sigmoid colon which is redundant Vascular/Lymphatic: No aortic aneurysm. No retroperitoneal or mesenteric adenopathy Reproductive: The patient is status post history Other: Stable chronic mild stranding of central mesenteric fat. This may be due to chronic mild inflammation or chronic mild mesenteric edema. This is stable from 2016 Musculoskeletal: There are artifacts from patient's large body habitus. There is disc space flattening with  vacuum disc phenomenon mild anterior and mild posterior spurring at L5-S1 level. IMPRESSION: 1. Again noted bilateral nonobstructive nephrolithiasis. 2. Mild left hydronephrosis and left hydroureter. No calcified left ureteral calculi are noted. There is left periureteral stranding. Findings highly suspicious for a recent passed left ureteral calculus or left urinary tract infection/inflammation. Clinical correlation is necessary. 3. No calcified calculi are noted within under distended urinary bladder. 4. Low lying cecum. Abundant stool noted within cecum. No pericecal inflammation. The appendix is not identified. Moderate stool noted in right colon and splenic flexure of the colon. 5. Few  diverticula are noted descending colon. No evidence of distal colitis or acute diverticulitis. 6. Stable mild stranding of central mesenteric fat which may be due to chronic mild inflammation or mild mesenteric edema. 7. Degenerative changes lumbar spine at L5-S1 level. Electronically Signed   By: Natasha Mead M.D.   On: 08/10/2016 13:25  "   * I personally reviewed the radiologic studies    ED Course:  Patient arrives with acute left flank pain status post removal of a left renal stent. Patient has findings on CAT scan of the recent removal of her stent but no obvious acute obstructive pattern from an upper kidney stone in the distal ureter etc. Her renal function appears to be stable. Patient had difficulty with pain control and apparently has a long history of chronic pain disorders and fibromyalgia. Was given IV Dilaudid, IV Zofran, a single dose of IV Toradol, lidocaine patch, and oral Dilaudid prior to departure. That appeared to be stable and I felt there was no reason to hospitalize the patient at this point and she was advised to follow up with her urologist by phone and see if they can acquire a urgent urology follow-up. I felt this was likely just residual discomfort from having her stent in place. She has a history of mesenteric edema that appears to be stable.      Assessment Acute left-sided abdominal pain Recent urinary stent and removal   Final Clinical Impression:  * Final diagnoses:  Left sided abdominal pain     Plan: * Patient was advised to return here especially for fever, increased pain, increased nausea vomiting or any other major concerns  " New Prescriptions   CEPHALEXIN (KEFLEX) 500 MG CAPSULE    Take 1 capsule (500 mg total) by mouth 2 (two) times daily.  " Patient was advised to return immediately if condition worsens. Patient was advised to follow up with their primary care physician or other specialized physicians involved in their outpatient care. The  patient and/or family member/power of attorney had laboratory results reviewed at the bedside. All questions and concerns were addressed and appropriate discharge instructions were distributed by the nursing staff.            Jennye Moccasin, MD 08/10/16 [phone]

## 2016-08-11 ENCOUNTER — Encounter: Payer: Self-pay | Admitting: Emergency Medicine

## 2016-08-11 ENCOUNTER — Observation Stay
Admission: EM | Admit: 2016-08-11 | Discharge: 2016-08-13 | Disposition: A | Discharge status: Home / Self Care | Payer: Medicare Other | Attending: Internal Medicine | Admitting: Internal Medicine

## 2016-08-11 DIAGNOSIS — Z7982 Long term (current) use of aspirin: Secondary | ICD-10-CM | POA: Diagnosis not present

## 2016-08-11 DIAGNOSIS — E890 Postprocedural hypothyroidism: Secondary | ICD-10-CM | POA: Insufficient documentation

## 2016-08-11 DIAGNOSIS — N23 Unspecified renal colic: Secondary | ICD-10-CM | POA: Insufficient documentation

## 2016-08-11 DIAGNOSIS — N1330 Unspecified hydronephrosis: Secondary | ICD-10-CM

## 2016-08-11 DIAGNOSIS — R1032 Left lower quadrant pain: Secondary | ICD-10-CM

## 2016-08-11 DIAGNOSIS — Z9889 Other specified postprocedural states: Secondary | ICD-10-CM | POA: Diagnosis not present

## 2016-08-11 DIAGNOSIS — M797 Fibromyalgia: Secondary | ICD-10-CM | POA: Insufficient documentation

## 2016-08-11 DIAGNOSIS — Z87442 Personal history of urinary calculi: Secondary | ICD-10-CM | POA: Insufficient documentation

## 2016-08-11 DIAGNOSIS — R52 Pain, unspecified: Secondary | ICD-10-CM

## 2016-08-11 DIAGNOSIS — G894 Chronic pain syndrome: Secondary | ICD-10-CM | POA: Insufficient documentation

## 2016-08-11 DIAGNOSIS — N3010 Interstitial cystitis (chronic) without hematuria: Secondary | ICD-10-CM | POA: Insufficient documentation

## 2016-08-11 DIAGNOSIS — Z882 Allergy status to sulfonamides status: Secondary | ICD-10-CM | POA: Diagnosis not present

## 2016-08-11 DIAGNOSIS — Z90710 Acquired absence of both cervix and uterus: Secondary | ICD-10-CM | POA: Insufficient documentation

## 2016-08-11 DIAGNOSIS — Z79899 Other long term (current) drug therapy: Secondary | ICD-10-CM | POA: Insufficient documentation

## 2016-08-11 DIAGNOSIS — Z881 Allergy status to other antibiotic agents status: Secondary | ICD-10-CM | POA: Diagnosis not present

## 2016-08-11 DIAGNOSIS — N189 Chronic kidney disease, unspecified: Secondary | ICD-10-CM | POA: Diagnosis not present

## 2016-08-11 DIAGNOSIS — N132 Hydronephrosis with renal and ureteral calculous obstruction: Principal | ICD-10-CM | POA: Insufficient documentation

## 2016-08-11 DIAGNOSIS — K449 Diaphragmatic hernia without obstruction or gangrene: Secondary | ICD-10-CM | POA: Diagnosis not present

## 2016-08-11 DIAGNOSIS — R944 Abnormal results of kidney function studies: Secondary | ICD-10-CM

## 2016-08-11 DIAGNOSIS — Z885 Allergy status to narcotic agent status: Secondary | ICD-10-CM | POA: Insufficient documentation

## 2016-08-11 DIAGNOSIS — R109 Unspecified abdominal pain: Secondary | ICD-10-CM | POA: Diagnosis present

## 2016-08-11 DIAGNOSIS — Z880 Allergy status to penicillin: Secondary | ICD-10-CM | POA: Insufficient documentation

## 2016-08-11 DIAGNOSIS — K5730 Diverticulosis of large intestine without perforation or abscess without bleeding: Secondary | ICD-10-CM | POA: Insufficient documentation

## 2016-08-11 DIAGNOSIS — M109 Gout, unspecified: Secondary | ICD-10-CM | POA: Insufficient documentation

## 2016-08-11 DIAGNOSIS — I129 Hypertensive chronic kidney disease with stage 1 through stage 4 chronic kidney disease, or unspecified chronic kidney disease: Secondary | ICD-10-CM | POA: Diagnosis not present

## 2016-08-11 LAB — CBC
HEMATOCRIT: 42.5 % (ref 35.0–47.0)
Hemoglobin: 13.8 g/dL (ref 12.0–16.0)
MCH: 28 pg (ref 26.0–34.0)
MCHC: 32.4 g/dL (ref 32.0–36.0)
MCV: 86.4 fL (ref 80.0–100.0)
Platelets: 308 10*3/uL (ref 150–440)
RBC: 4.92 MIL/uL (ref 3.80–5.20)
RDW: 13.8 % (ref 11.5–14.5)
WBC: 10.2 10*3/uL (ref 3.6–11.0)

## 2016-08-11 LAB — BASIC METABOLIC PANEL
Anion gap: 5 (ref 5–15)
BUN: 17 mg/dL (ref 6–20)
CHLORIDE: 108 mmol/L (ref 101–111)
CO2: 28 mmol/L (ref 22–32)
Calcium: 9.2 mg/dL (ref 8.9–10.3)
Creatinine, Ser: 1.49 mg/dL — ABNORMAL HIGH (ref 0.44–1.00)
GFR calc Af Amer: 44 mL/min — ABNORMAL LOW (ref >60)
GFR calc non Af Amer: 38 mL/min — ABNORMAL LOW (ref >60)
GLUCOSE: 100 mg/dL — AB (ref 65–99)
POTASSIUM: 4 mmol/L (ref 3.5–5.1)
Sodium: 141 mmol/L (ref 135–145)

## 2016-08-11 LAB — CBC WITH DIFFERENTIAL/PLATELET
Basophils Absolute: 0.1 10*3/uL (ref 0–0.1)
Basophils Relative: 1 %
Eosinophils Absolute: 0.2 10*3/uL (ref 0–0.7)
Eosinophils Relative: 2 %
HCT: 37.4 % (ref 35.0–47.0)
Hemoglobin: 12.1 g/dL (ref 12.0–16.0)
Lymphocytes Relative: 27 %
Lymphs Abs: 2.5 10*3/uL (ref 1.0–3.6)
MCH: 27.5 pg (ref 26.0–34.0)
MCHC: 32.3 g/dL (ref 32.0–36.0)
MCV: 85.1 fL (ref 80.0–100.0)
Monocytes Absolute: 0.8 10*3/uL (ref 0.2–0.9)
Monocytes Relative: 9 %
Neutro Abs: 5.5 10*3/uL (ref 1.4–6.5)
Neutrophils Relative %: 61 %
Platelets: 317 10*3/uL (ref 150–440)
RBC: 4.4 MIL/uL (ref 3.80–5.20)
RDW: 13.9 % (ref 11.5–14.5)
WBC: 9 10*3/uL (ref 3.6–11.0)

## 2016-08-11 LAB — CREATININE, SERUM
Creatinine, Ser: 1.33 mg/dL — ABNORMAL HIGH (ref 0.44–1.00)
GFR calc non Af Amer: 44 mL/min — ABNORMAL LOW (ref >60)
GFR, EST AFRICAN AMERICAN: 51 mL/min — AB (ref >60)

## 2016-08-11 MED ORDER — IBUPROFEN 400 MG PO TABS: 400.0000 mg | ORAL_TABLET | Freq: Four times a day (QID) | ORAL | Status: DC | PRN

## 2016-08-11 MED ORDER — SENNOSIDES-DOCUSATE SODIUM 8.6-50 MG PO TABS: 1.0000 | ORAL_TABLET | Freq: Every evening | ORAL | Status: DC | PRN

## 2016-08-11 MED ORDER — TRIMETHOPRIM 100 MG PO TABS
100.0000 mg | ORAL_TABLET | Freq: Two times a day (BID) | ORAL | Status: DC
  Administered 2016-08-11 – 2016-08-12 (×3): 100 mg via ORAL
  Filled 2016-08-11 (×6): qty 1

## 2016-08-11 MED ORDER — BISACODYL 10 MG RE SUPP
10.0000 mg | Freq: Once | RECTAL | Status: AC
  Administered 2016-08-11: 10 mg via RECTAL
  Filled 2016-08-11: qty 1

## 2016-08-11 MED ORDER — ASPIRIN EC 81 MG PO TBEC
81.0000 mg | DELAYED_RELEASE_TABLET | Freq: Every day | ORAL | Status: DC
  Administered 2016-08-11 – 2016-08-13 (×3): 81 mg via ORAL
  Filled 2016-08-11 (×3): qty 1

## 2016-08-11 MED ORDER — SODIUM CHLORIDE 0.9 % IV BOLUS (SEPSIS)
1000.0000 mL | Freq: Once | INTRAVENOUS | Status: AC
  Administered 2016-08-11: 1000 mL via INTRAVENOUS

## 2016-08-11 MED ORDER — PENTOXIFYLLINE ER 400 MG PO TBCR
400.0000 mg | EXTENDED_RELEASE_TABLET | Freq: Two times a day (BID) | ORAL | Status: DC
  Administered 2016-08-11 – 2016-08-13 (×4): 400 mg via ORAL
  Filled 2016-08-11 (×5): qty 1

## 2016-08-11 MED ORDER — TRAMADOL HCL 50 MG PO TABS
50.0000 mg | ORAL_TABLET | Freq: Three times a day (TID) | ORAL | Status: DC | PRN
  Administered 2016-08-11 – 2016-08-13 (×4): 50 mg via ORAL
  Filled 2016-08-11 (×5): qty 1

## 2016-08-11 MED ORDER — LEVOTHYROXINE SODIUM 25 MCG PO TABS: 25.0000 ug | ORAL_TABLET | Freq: Every day | ORAL | Status: DC

## 2016-08-11 MED ORDER — LISINOPRIL 10 MG PO TABS
10.0000 mg | ORAL_TABLET | Freq: Every day | ORAL | Status: DC
  Administered 2016-08-11 – 2016-08-13 (×3): 10 mg via ORAL
  Filled 2016-08-11 (×3): qty 1

## 2016-08-11 MED ORDER — AMITRIPTYLINE HCL 10 MG PO TABS
50.0000 mg | ORAL_TABLET | Freq: Every day | ORAL | Status: DC
  Administered 2016-08-11 – 2016-08-12 (×2): 50 mg via ORAL
  Filled 2016-08-11 (×2): qty 5

## 2016-08-11 MED ORDER — CEFTRIAXONE SODIUM-DEXTROSE 1-3.74 GM-% IV SOLR
1.0000 g | INTRAVENOUS | Status: DC
  Administered 2016-08-11 – 2016-08-13 (×3): 1 g via INTRAVENOUS
  Filled 2016-08-11 (×5): qty 50

## 2016-08-11 MED ORDER — HYDROMORPHONE HCL 1 MG/ML IJ SOLN
1.0000 mg | Freq: Once | INTRAMUSCULAR | Status: AC
Start: 2016-08-11 — End: 2016-08-11
  Administered 2016-08-11: 1 mg via INTRAVENOUS
  Filled 2016-08-11: qty 1

## 2016-08-11 MED ORDER — APREMILAST 10 & 20 & 30 MG PO TBPK: 10.0000 mg | ORAL_TABLET | Freq: Every morning | ORAL | Status: DC

## 2016-08-11 MED ORDER — PREGABALIN 50 MG PO CAPS
100.0000 mg | ORAL_CAPSULE | Freq: Three times a day (TID) | ORAL | Status: DC
  Administered 2016-08-11 – 2016-08-13 (×6): 100 mg via ORAL
  Filled 2016-08-11 (×6): qty 2

## 2016-08-11 MED ORDER — KETOROLAC TROMETHAMINE 30 MG/ML IJ SOLN
10.0000 mg | Freq: Once | INTRAMUSCULAR | Status: AC
  Administered 2016-08-11: 9.9 mg via INTRAVENOUS

## 2016-08-11 MED ORDER — LEVOTHYROXINE SODIUM 75 MCG PO TABS: 225.0000 ug | ORAL_TABLET | ORAL | Status: DC

## 2016-08-11 MED ORDER — ONDANSETRON 4 MG PO TBDP: 4.0000 mg | ORAL_TABLET | Freq: Three times a day (TID) | ORAL | Status: DC | PRN

## 2016-08-11 MED ORDER — ONDANSETRON HCL 4 MG PO TABS: 4.0000 mg | ORAL_TABLET | Freq: Four times a day (QID) | ORAL | Status: DC | PRN

## 2016-08-11 MED ORDER — LORAZEPAM 2 MG/ML IJ SOLN
1.0000 mg | Freq: Once | INTRAMUSCULAR | Status: AC
  Administered 2016-08-11: 1 mg via INTRAVENOUS
  Filled 2016-08-11: qty 1

## 2016-08-11 MED ORDER — TAMSULOSIN HCL 0.4 MG PO CAPS
0.4000 mg | ORAL_CAPSULE | Freq: Every day | ORAL | Status: DC
  Administered 2016-08-11 – 2016-08-13 (×3): 0.4 mg via ORAL
  Filled 2016-08-11 (×3): qty 1

## 2016-08-11 MED ORDER — CYCLOBENZAPRINE HCL 10 MG PO TABS: 10.0000 mg | ORAL_TABLET | Freq: Three times a day (TID) | ORAL | Status: DC | PRN

## 2016-08-11 MED ORDER — PROMETHAZINE HCL 25 MG PO TABS: 12.5000 mg | ORAL_TABLET | Freq: Four times a day (QID) | ORAL | Status: DC | PRN

## 2016-08-11 MED ORDER — KETOROLAC TROMETHAMINE 60 MG/2ML IM SOLN
INTRAMUSCULAR | Status: AC
  Filled 2016-08-11: qty 2

## 2016-08-11 MED ORDER — ACETAMINOPHEN 325 MG PO TABS: 650.0000 mg | ORAL_TABLET | Freq: Four times a day (QID) | ORAL | Status: DC | PRN

## 2016-08-11 MED ORDER — ENOXAPARIN SODIUM 40 MG/0.4ML ~~LOC~~ SOLN
40.0000 mg | SUBCUTANEOUS | Status: DC
  Administered 2016-08-11: 40 mg via SUBCUTANEOUS
  Filled 2016-08-11: qty 0.4

## 2016-08-11 MED ORDER — KETOROLAC TROMETHAMINE 15 MG/ML IJ SOLN
30.0000 mg | Freq: Four times a day (QID) | INTRAMUSCULAR | Status: DC | PRN
  Administered 2016-08-11 – 2016-08-13 (×3): 30 mg via INTRAVENOUS
  Filled 2016-08-11: qty 1
  Filled 2016-08-11 (×2): qty 2
  Filled 2016-08-11: qty 1
  Filled 2016-08-11: qty 2

## 2016-08-11 MED ORDER — DICLOFENAC EPOLAMINE 1.3 % TD PTCH: 1.0000 | MEDICATED_PATCH | Freq: Two times a day (BID) | TRANSDERMAL | Status: DC

## 2016-08-11 MED ORDER — FAMOTIDINE 20 MG PO TABS
20.0000 mg | ORAL_TABLET | Freq: Every day | ORAL | Status: DC
  Administered 2016-08-11 – 2016-08-13 (×3): 20 mg via ORAL
  Filled 2016-08-11 (×3): qty 1

## 2016-08-11 MED ORDER — DEXTROSE 5 % IV SOLN: 1.0000 g | INTRAVENOUS | Status: DC

## 2016-08-11 MED ORDER — SODIUM CHLORIDE 0.9 % IV SOLN
INTRAVENOUS | Status: DC
  Administered 2016-08-11 – 2016-08-12 (×2): via INTRAVENOUS

## 2016-08-11 MED ORDER — ONDANSETRON HCL 4 MG/2ML IJ SOLN
4.0000 mg | Freq: Four times a day (QID) | INTRAMUSCULAR | Status: DC | PRN
Start: 2016-08-11 — End: 2016-08-13
  Administered 2016-08-11 (×2): 4 mg via INTRAVENOUS
  Filled 2016-08-11 (×2): qty 2

## 2016-08-11 MED ORDER — MIRABEGRON ER 25 MG PO TB24
50.0000 mg | ORAL_TABLET | Freq: Every day | ORAL | Status: DC
  Administered 2016-08-11 – 2016-08-13 (×3): 50 mg via ORAL
  Filled 2016-08-11 (×3): qty 2

## 2016-08-11 MED ORDER — TIZANIDINE HCL 4 MG PO TABS: 2.0000 mg | ORAL_TABLET | Freq: Three times a day (TID) | ORAL | Status: DC | PRN

## 2016-08-11 MED ORDER — ACETAMINOPHEN 650 MG RE SUPP: 650.0000 mg | Freq: Four times a day (QID) | RECTAL | Status: DC | PRN

## 2016-08-11 MED ORDER — OXYBUTYNIN CHLORIDE 5 MG PO TABS
5.0000 mg | ORAL_TABLET | Freq: Three times a day (TID) | ORAL | Status: DC
  Administered 2016-08-11 – 2016-08-13 (×6): 5 mg via ORAL
  Filled 2016-08-11 (×7): qty 1

## 2016-08-11 MED ORDER — ONDANSETRON HCL 4 MG/2ML IJ SOLN
4.0000 mg | Freq: Once | INTRAMUSCULAR | Status: AC
  Administered 2016-08-11: 4 mg via INTRAVENOUS
  Filled 2016-08-11: qty 2

## 2016-08-11 MED ORDER — PENTOSAN POLYSULFATE SODIUM 100 MG PO CAPS
100.0000 mg | ORAL_CAPSULE | Freq: Three times a day (TID) | ORAL | Status: DC
  Administered 2016-08-11 – 2016-08-12 (×5): 100 mg via ORAL
  Filled 2016-08-11 (×9): qty 1

## 2016-08-11 NOTE — Consult Note (Signed)
Consultation: Left hydronephrosis Requested by: Dr. Camillia Herter   Impression/Assessment/plan: Rec: -supportive care - pain meds, nsaids, oxybutynin or hyoscyamine SL -NPO p MN -ordered renal u/s for AM -if hydro persists and she continues to have pain will replace stent tomorrow     History of Present Illness:  56 yo with chronic pain, IC and FM taken for elective left ureteroscopy (URS) for a 4 mm LLP stone 3/26 with [facility] GU. Stent was removed 3/29 and she's had pain common after stent removal which she rates as severe. Labs and vitals look good. CT benign (mild hydro typical after these procedures). UA with tntc wbc and tntc rbc, no bacteria - again normal after URS. Cr up slightly but she has baseline CKD looking back to 2016.   She's been afebrile today. She says her pain is severe and persistent in the LLQ and left flank. No dysuria. Red urine on and off, no clots.    Past Medical History:  Diagnosis Date  . Chronic pain syndrome   . Fibromyalgia   . Gout   . Hypertension   . IC (interstitial cystitis)   . Spinal stenosis    Past Surgical History:  Procedure Laterality Date  . ABDOMINAL HYSTERECTOMY    . APPENDECTOMY    . BLADDER SUSPENSION    . ECTOPIC PREGNANCY SURGERY    . meniscus tear    . ROTATOR CUFF REPAIR    . THYROIDECTOMY    . TUBAL LIGATION      Home Medications:  Prescriptions Prior to Admission  Medication Sig Dispense Refill Last Dose  . traMADol (ULTRAM) 50 MG tablet Take 1 tablet (50 mg total) by mouth 3 (three) times daily as needed for moderate pain or severe pain. 20 tablet 0 08/10/2016 at 2130  . amitriptyline (ELAVIL) 50 MG tablet Take 50 mg by mouth at bedtime.   08/09/2016 at 2100  . Apremilast 10 & 20 & 30 MG TBPK Take 10 mg by mouth every morning.   08/09/2016 at 0800  . aspirin EC 81 MG tablet Take 81 mg by mouth daily.   08/09/2016 at 0800  . cephALEXin (KEFLEX) 500 MG capsule Take 1 capsule (500 mg total) by mouth 2 (two) times daily. 5 capsule  0   . cyclobenzaprine (FLEXERIL) 10 MG tablet Take 1 tablet (10 mg total) by mouth every 8 (eight) hours as needed for muscle spasms. 30 tablet 1 prn at prn  . diclofenac (FLECTOR) 1.3 % PTCH Place 1 patch onto the skin 2 (two) times daily.   prn at prn  . diclofenac sodium (VOLTAREN) 1 % GEL Apply 2 g topically 4 (four) times daily as needed.   prn at prn  . fluocinolone (VANOS) 0.01 % cream Apply 1 application topically 2 (two) times daily.   prn at prn  . hydrOXYzine (ATARAX/VISTARIL) 25 MG tablet Take 25 mg by mouth 2 (two) times daily.   08/09/2016 at 1800  . Hyoscyamine Sulfate SL 0.125 MG SUBL Place 0.125 mg under the tongue every 6 (six) hours as needed.   prn at prn  . levothyroxine (SYNTHROID, LEVOTHROID) 200 MCG tablet Take 225 mcg by mouth daily.   08/09/2016 at 0800  . levothyroxine (SYNTHROID, LEVOTHROID) 25 MCG tablet Take 25 mcg by mouth daily before breakfast.   08/09/2016 at 0800  . lisinopril (PRINIVIL,ZESTRIL) 10 MG tablet Take 10 mg by mouth daily.   08/09/2016 at 0800  . mirabegron ER (MYRBETRIQ) 50 MG TB24 tablet Take 50 mg  by mouth daily.   08/09/2016 at 0800  . ondansetron (ZOFRAN-ODT) 4 MG disintegrating tablet Take 4 mg by mouth every 8 (eight) hours as needed for nausea or vomiting.   prn at prn  . pentosan polysulfate (ELMIRON) 100 MG capsule Take 100 mg by mouth 3 (three) times daily.   08/09/2016 at 0800  . pentoxifylline (TRENTAL) 400 MG CR tablet Take 400 mg by mouth 2 (two) times daily.   08/09/2016 at 0800  . pregabalin (LYRICA) 100 MG capsule Take 100 mg by mouth 3 (three) times daily.   08/09/2016 at 2100  . promethazine (PHENERGAN) 12.5 MG tablet Take 1 tablet (12.5 mg total) by mouth every 6 (six) hours as needed for nausea or vomiting. 15 tablet 0 prn at prn  . ranitidine (ZANTAC) 300 MG tablet Take 300 mg by mouth at bedtime.   08/09/2016 at 2100  . tamsulosin (FLOMAX) 0.4 MG CAPS capsule Take 0.4 mg by mouth daily.   08/09/2016 at 0800  . tiZANidine (ZANAFLEX) 2 MG  tablet Take 2 mg by mouth every 8 (eight) hours as needed for muscle spasms.   prn at prn  . trimethoprim (TRIMPEX) 100 MG tablet Take 100 mg by mouth 2 (two) times daily.   08/09/2016 at 1800   Allergies:  Allergies  Allergen Reactions  . Amoxicillin Nausea And Vomiting  . Azithromycin Nausea And Vomiting  . Ciprofloxacin Nausea And Vomiting    "Cannot take by mouth, OK by IV"  . Hydrocodone Nausea And Vomiting  . Percocet [Oxycodone-Acetaminophen] Nausea And Vomiting  . Sulfa Antibiotics Nausea And Vomiting    No family history on file. Social History:  reports that she has never smoked. She has never used smokeless tobacco. She reports that she drinks alcohol. She reports that she does not use drugs.  ROS: A complete review of systems was performed.  All systems are negative except for pertinent findings as noted. ROS   Physical Exam:  Vital signs in last 24 hours: Temp:  [98.3 F (36.8 C)-98.6 F (37 C)] 98.3 F (36.8 C) (03/30 1239) Pulse Rate:  [74-102] 81 (03/30 1239) Resp:  [16-22] 20 (03/30 1239) BP: (94-165)/(61-93) 156/75 (03/30 1239) SpO2:  [84 %-98 %] 97 % (03/30 1239) Weight:  [108.9 kg (240 lb)] 108.9 kg (240 lb) (03/30 0210) General:  Alert and oriented, No acute distress, walking around room from bathroom and got back in bed  HEENT: Normocephalic, atraumatic Neck: No JVD or lymphadenopathy Cardiovascular: Regular rate and rhythm Lungs: Regular rate and effort Abdomen: Soft, nontender, nondistended, no abdominal masses Back: No CVA tenderness Extremities: No edema Neurologic: Grossly intact  Laboratory Data:  Results for orders placed or performed during the hospital encounter of 08/11/16 (from the past 24 hour(s))  CBC with Differential     Status: None   Collection Time: 08/11/16  2:55 AM  Result Value Ref Range   WBC 9.0 3.6 - 11.0 K/uL   RBC 4.40 3.80 - 5.20 MIL/uL   Hemoglobin 12.1 12.0 - 16.0 g/dL   HCT 19.1 47.8 - 29.5 %   MCV 85.1 80.0 -  100.0 fL   MCH 27.5 26.0 - 34.0 pg   MCHC 32.3 32.0 - 36.0 g/dL   RDW 62.1 30.8 - 65.7 %   Platelets 317 150 - 440 K/uL   Neutrophils Relative % 61 %   Neutro Abs 5.5 1.4 - 6.5 K/uL   Lymphocytes Relative 27 %   Lymphs Abs 2.5 1.0 - 3.6 K/uL  Monocytes Relative 9 %   Monocytes Absolute 0.8 0.2 - 0.9 K/uL   Eosinophils Relative 2 %   Eosinophils Absolute 0.2 0 - 0.7 K/uL   Basophils Relative 1 %   Basophils Absolute 0.1 0 - 0.1 K/uL  Basic metabolic panel     Status: Abnormal   Collection Time: 08/11/16  2:55 AM  Result Value Ref Range   Sodium 141 135 - 145 mmol/L   Potassium 4.0 3.5 - 5.1 mmol/L   Chloride 108 101 - 111 mmol/L   CO2 28 22 - 32 mmol/L   Glucose, Bld 100 (H) 65 - 99 mg/dL   BUN 17 6 - 20 mg/dL   Creatinine, Ser 7.82 (H) 0.44 - 1.00 mg/dL   Calcium 9.2 8.9 - 95.6 mg/dL   GFR calc non Af Amer 38 (L) >60 mL/min   GFR calc Af Amer 44 (L) >60 mL/min   Anion gap 5 5 - 15  CBC     Status: None   Collection Time: 08/11/16  2:41 PM  Result Value Ref Range   WBC 10.2 3.6 - 11.0 K/uL   RBC 4.92 3.80 - 5.20 MIL/uL   Hemoglobin 13.8 12.0 - 16.0 g/dL   HCT 21.3 08.6 - 57.8 %   MCV 86.4 80.0 - 100.0 fL   MCH 28.0 26.0 - 34.0 pg   MCHC 32.4 32.0 - 36.0 g/dL   RDW 46.9 62.9 - 52.8 %   Platelets 308 150 - 440 K/uL  Creatinine, serum     Status: Abnormal   Collection Time: 08/11/16  2:41 PM  Result Value Ref Range   Creatinine, Ser 1.33 (H) 0.44 - 1.00 mg/dL   GFR calc non Af Amer 44 (L) >60 mL/min   GFR calc Af Amer 51 (L) >60 mL/min   No results found for this or any previous visit (from the past 240 hour(s)). Creatinine:  Recent Labs  08/10/16 1258 08/11/16 0255 08/11/16 1441  CREATININE 1.48* 1.49* 1.33*      Joanna James 08/11/2016, 4:44 PM

## 2016-08-11 NOTE — ED Notes (Signed)
Patient has lidocaine patch on left posterior shoulder

## 2016-08-11 NOTE — ED Provider Notes (Signed)
[facility] Emergency Department Provider Note   ____________________________________________   First MD Initiated Contact with Patient 08/11/16 0250     (approximate)  I have reviewed the triage vital signs and the nursing notes.   HISTORY  Chief Complaint Flank Pain    HPI Joanna James is a 56 y.o. female who returns to the ED from home with a chief complaint of left flank and abdominal pain.Patient had recent cystoscopy with left-sided stent placement at [facility]. Yesterday approximately noon time she removed her stent as directed by her urologist. Patient began having pain acutely afterwards and was seen in the ED. Per chart, patient required multiple doses of IV analgesia to control her pain. States she did okay after discharge but pain returned approximately one hour prior to arrival. Complains of sharp left flank and left abdominal pain associated with nausea and vomiting. Denies associated fever, chills, chest pain, shortness of breath, diarrhea. Notes hematuria. Denies recent travel or trauma. Nothing makes her symptoms better or worse. She has not yet been able to fill her Keflex prescription which was written yesterday.   Past Medical History:  Diagnosis Date  . Chronic pain syndrome   . Fibromyalgia   . Gout   . Hypertension   . IC (interstitial cystitis)   . Spinal stenosis     Patient Active Problem List   Diagnosis Date Noted  . Abdominal pain 06/21/2016    Past Surgical History:  Procedure Laterality Date  . ABDOMINAL HYSTERECTOMY    . APPENDECTOMY    . BLADDER SUSPENSION    . ECTOPIC PREGNANCY SURGERY    . meniscus tear    . ROTATOR CUFF REPAIR    . THYROIDECTOMY    . TUBAL LIGATION      Prior to Admission medications   Medication Sig Start Date End Date Taking? Authorizing Provider  amitriptyline (ELAVIL) 50 MG tablet Take 50 mg by mouth at bedtime. 02/21/16 02/20/17  Historical Provider, MD    Apremilast 10 & 20 & 30 MG TBPK Take 10 mg by mouth every morning.    Historical Provider, MD  aspirin EC 81 MG tablet Take 81 mg by mouth daily. 02/23/14   Historical Provider, MD  cephALEXin (KEFLEX) 500 MG capsule Take 1 capsule (500 mg total) by mouth 2 (two) times daily. 08/10/16 08/20/16  Jennye Moccasin, MD  cyclobenzaprine (FLEXERIL) 10 MG tablet Take 1 tablet (10 mg total) by mouth every 8 (eight) hours as needed for muscle spasms. 04/26/15   Evangeline Dakin, PA-C  diclofenac (FLECTOR) 1.3 % PTCH Place 1 patch onto the skin 2 (two) times daily.    Historical Provider, MD  diclofenac sodium (VOLTAREN) 1 % GEL Apply 2 g topically 4 (four) times daily as needed.    Historical Provider, MD  fluocinolone (VANOS) 0.01 % cream Apply 1 application topically 2 (two) times daily.    Historical Provider, MD  hydrOXYzine (ATARAX/VISTARIL) 25 MG tablet Take 25 mg by mouth 2 (two) times daily.    Historical Provider, MD  Hyoscyamine Sulfate SL 0.125 MG SUBL Place 0.125 mg under the tongue every 6 (six) hours as needed.    Historical Provider, MD  levothyroxine (SYNTHROID, LEVOTHROID) 200 MCG tablet Take 225 mcg by mouth daily. 03/20/16   Historical Provider, MD  levothyroxine (SYNTHROID, LEVOTHROID) 25 MCG tablet Take 25 mcg by mouth daily before breakfast.    Historical Provider, MD  lisinopril (PRINIVIL,ZESTRIL) 10 MG tablet Take 10 mg by  mouth daily.    Historical Provider, MD  mirabegron ER (MYRBETRIQ) 50 MG TB24 tablet Take 50 mg by mouth daily.    Historical Provider, MD  ondansetron (ZOFRAN-ODT) 4 MG disintegrating tablet Take 4 mg by mouth every 8 (eight) hours as needed for nausea or vomiting.    Historical Provider, MD  pentosan polysulfate (ELMIRON) 100 MG capsule Take 100 mg by mouth 3 (three) times daily.    Historical Provider, MD  pentoxifylline (TRENTAL) 400 MG CR tablet Take 400 mg by mouth 2 (two) times daily.    Historical Provider, MD  pregabalin (LYRICA) 100 MG capsule Take 100 mg by  mouth 3 (three) times daily. 04/11/16   Historical Provider, MD  promethazine (PHENERGAN) 12.5 MG tablet Take 1 tablet (12.5 mg total) by mouth every 6 (six) hours as needed for nausea or vomiting. 05/30/15   Minna Antis, MD  ranitidine (ZANTAC) 300 MG tablet Take 300 mg by mouth at bedtime. 03/09/16 03/09/17  Historical Provider, MD  tamsulosin (FLOMAX) 0.4 MG CAPS capsule Take 0.4 mg by mouth daily.    Historical Provider, MD  tiZANidine (ZANAFLEX) 2 MG tablet Take 2 mg by mouth every 8 (eight) hours as needed for muscle spasms. 04/11/16   Historical Provider, MD  traMADol (ULTRAM) 50 MG tablet Take 1 tablet (50 mg total) by mouth 3 (three) times daily as needed for moderate pain or severe pain. 06/22/16   Enedina Finner, MD  trimethoprim (TRIMPEX) 100 MG tablet Take 100 mg by mouth 2 (two) times daily.    Historical Provider, MD    Allergies Amoxicillin; Azithromycin; Ciprofloxacin; Hydrocodone; Percocet [oxycodone-acetaminophen]; and Sulfa antibiotics  No family history on file.  Social History Social History  Substance Use Topics  . Smoking status: Never Smoker  . Smokeless tobacco: Never Used  . Alcohol use Yes    Review of Systems  Constitutional: No fever/chills. Eyes: No visual changes. ENT: No sore throat. Cardiovascular: Denies chest pain. Respiratory: Denies shortness of breath. Gastrointestinal: Positive for left flank and abdominal pain.  Positive for nausea and vomiting.  No diarrhea.  No constipation. Genitourinary: Negative for dysuria. Musculoskeletal: Negative for back pain. Skin: Negative for rash. Neurological: Negative for headaches, focal weakness or numbness.  10-point ROS otherwise negative.  ____________________________________________   PHYSICAL EXAM:  VITAL SIGNS: ED Triage Vitals  Enc Vitals Group     BP 08/11/16 0213 (!) 150/92     Pulse Rate 08/11/16 0213 87     Resp 08/11/16 0213 (!) 22     Temp 08/11/16 0213 98.6 F (37 C)     Temp  Source 08/11/16 0213 Oral     SpO2 08/11/16 0213 98 %     Weight 08/11/16 0210 240 lb (108.9 kg)     Height 08/11/16 0210  (1.6 m)     Head Circumference --      Peak Flow --      Pain Score 08/11/16 0210 10     Pain Loc --      Pain Edu? --      Excl. in GC? --     Constitutional: Alert and oriented. Uncomfortable appearing and in mild acute distress. Eyes: Conjunctivae are normal. PERRL. EOMI. Head: Atraumatic. Nose: No congestion/rhinnorhea. Mouth/Throat: Mucous membranes are moist.  Oropharynx non-erythematous. Neck: No stridor.   Cardiovascular: Normal rate, regular rhythm. Grossly normal heart sounds.  Good peripheral circulation. Respiratory: Normal respiratory effort.  No retractions. Lungs CTAB. Gastrointestinal: Soft and mildly tender to palpation left  lower quadrant without rebound or guarding. No distention. No abdominal bruits. Mild left CVA tenderness. Musculoskeletal: No lower extremity tenderness nor edema.  No joint effusions. Neurologic:  Normal speech and language. No gross focal neurologic deficits are appreciated. No gait instability. Skin:  Skin is warm, dry and intact. No rash noted. No vesicles. Psychiatric: Mood and affect are normal. Speech and behavior are normal.  ____________________________________________   LABS (all labs ordered are listed, but only abnormal results are displayed)  Labs Reviewed  BASIC METABOLIC PANEL - Abnormal; Notable for the following:       Result Value   Glucose, Bld 100 (*)    Creatinine, Ser 1.49 (*)    GFR calc non Af Amer 38 (*)    GFR calc Af Amer 44 (*)    All other components within normal limits  CBC WITH DIFFERENTIAL/PLATELET   ____________________________________________  EKG  None ____________________________________________  RADIOLOGY  None ____________________________________________   PROCEDURES  Procedure(s) performed: None  Procedures  Critical Care performed:   CRITICAL  CARE Performed by: Irean Hong   Total critical care time: 45 minutes  Critical care time was exclusive of separately billable procedures and treating other patients.  Critical care was necessary to treat or prevent imminent or life-threatening deterioration.  Critical care was time spent personally by me on the following activities: development of treatment plan with patient and/or surrogate as well as nursing, discussions with consultants, evaluation of patient's response to treatment, examination of patient, obtaining history from patient or surrogate, ordering and performing treatments and interventions, ordering and review of laboratory studies, ordering and review of radiographic studies, pulse oximetry and re-evaluation of patient's condition.  ____________________________________________   INITIAL IMPRESSION / ASSESSMENT AND PLAN / ED COURSE  Pertinent labs & imaging results that were available during my care of the patient were reviewed by me and considered in my medical decision making (see chart for details).  56 year old female who returns to the ED for pain control status post removing left-sided ureteral stent. Patient had a CT scan yesterday which did not demonstrate obstructive pattern. Creatinine remains stable. Will initiate IV fluid resuscitation, analgesia and reassess.  Clinical Course as of Aug 11 732  Fri Aug 11, 2016  0402 No relief with IV Dilaudid. Will add IV benzodiazepine for suspected bladder spasms.  [JS]  0430 No relief after IV Ativan. Will repeat IV Dilaudid with Toradol.  [JS]  [id number] Patient soundly asleep. Visitor at bedside. Will continue to monitor.  [JS]  [id number] Patient now awake, complains of 8/10 pain. She has had numerous rounds of IV analgesics without adequate control of her pain. Will discuss with hospitalist to evaluate in the emergency department for admission.  [JS]  0702 Spoke with urology Dr. Sherryl Barters who recommends Ditropan to control  symptoms of bladder spasms and urinary frequency.  [JS]    Clinical Course User Index [JS] Irean Hong, MD     ____________________________________________   FINAL CLINICAL IMPRESSION(S) / ED DIAGNOSES  Final diagnoses:  Left flank pain  Intractable pain      NEW MEDICATIONS STARTED DURING THIS VISIT:  New Prescriptions   No medications on file     Note:  This document was prepared using Dragon voice recognition software and may include unintentional dictation errors.    Irean Hong, MD 08/11/16 [phone]

## 2016-08-11 NOTE — Progress Notes (Signed)
  Chart reviewed, full consult to follow. S/p elective left ureteroscopy (URS) for a 4 mm LLP stone 3/26 with [facility] GU. Stent was removed 3/29 and she's had pain common after stent removal. Labs and vitals look good. CT benign (mild hydro typical after these procedures). UA with tntc wbc and tntc rbc, no bacteria - again normal after URS. Cr up slightly but she has baseline CKD looking back to 2016.  Rec: -supportive care - pain meds, nsaids, oxybutynin or hyoscyamine SL -she has chronic pain, IC and FM - would avoid stent replacement as stents are painful  -she was rx'd cephalexin after her procedure which she can continue  -important to f/u with Dr. Logan Bores in 4-6 weeks to ensure hydro resolves (standard f/u after URS) -she can be d/c'd from GU point of view when pain control and she's tolerating a reg diet

## 2016-08-11 NOTE — ED Notes (Signed)
Patient reports she had kidney stone surgically removed from left kidney Monday. Pt removed stent Thursday. Pt c/o left flank pain, left lower abdominal pain.

## 2016-08-11 NOTE — ED Triage Notes (Addendum)
Pt to room 8 via w/c actively vomiting; stent placed Monday, removed yesterday; awoke hr PTA with left flank pain accomp by N/V; took tramadol without relief; rx antibiotic yesterday but has not filled rx yet

## 2016-08-11 NOTE — Progress Notes (Signed)
ANTIBIOTIC CONSULT NOTE - INITIAL  Pharmacy Consult for Rocephin Indication: flank pain/ urinary tract infection   Allergies  Allergen Reactions  . Amoxicillin Nausea And Vomiting  . Azithromycin Nausea And Vomiting  . Ciprofloxacin Nausea And Vomiting    "Cannot take by mouth, OK by IV"  . Hydrocodone Nausea And Vomiting  . Percocet [Oxycodone-Acetaminophen] Nausea And Vomiting  . Sulfa Antibiotics Nausea And Vomiting    Patient Measurements: Height:  (160 cm) Weight: 240 lb (108.9 kg) IBW/kg (Calculated) : 52.4 Adjusted Body Weight:   Vital Signs: Temp: 98.6 F (37 C) (03/30 0213) Temp Source: Oral (03/30 0213) BP: 125/71 (03/30 0830) Pulse Rate: 84 (03/30 0844) Intake/Output from previous day: No intake/output data recorded. Intake/Output from this shift: No intake/output data recorded.  Labs:  Recent Labs  08/10/16 1258 08/11/16 0255  WBC 13.3* 9.0  HGB 13.2 12.1  PLT 295 317  CREATININE 1.48* 1.49*   Estimated Creatinine Clearance: 49.9 mL/min (A) (by C-G formula based on SCr of 1.49 mg/dL (H)). No results for input(s): VANCOTROUGH, VANCOPEAK, VANCORANDOM, GENTTROUGH, GENTPEAK, GENTRANDOM, TOBRATROUGH, TOBRAPEAK, TOBRARND, AMIKACINPEAK, AMIKACINTROU, AMIKACIN in the last 72 hours.   Microbiology: No results found for this or any previous visit (from the past 720 hour(s)).  Medical History: Past Medical History:  Diagnosis Date  . Chronic pain syndrome   . Fibromyalgia   . Gout   . Hypertension   . IC (interstitial cystitis)   . Spinal stenosis     Medications:  Prescriptions Prior to Admission  Medication Sig Dispense Refill Last Dose  . traMADol (ULTRAM) 50 MG tablet Take 1 tablet (50 mg total) by mouth 3 (three) times daily as needed for moderate pain or severe pain. 20 tablet 0 08/10/2016 at 2130  . amitriptyline (ELAVIL) 50 MG tablet Take 50 mg by mouth at bedtime.   08/09/2016 at 2100  . Apremilast 10 & 20 & 30 MG TBPK Take 10 mg by mouth  every morning.   08/09/2016 at 0800  . aspirin EC 81 MG tablet Take 81 mg by mouth daily.   08/09/2016 at 0800  . cephALEXin (KEFLEX) 500 MG capsule Take 1 capsule (500 mg total) by mouth 2 (two) times daily. 5 capsule 0   . cyclobenzaprine (FLEXERIL) 10 MG tablet Take 1 tablet (10 mg total) by mouth every 8 (eight) hours as needed for muscle spasms. 30 tablet 1 prn at prn  . diclofenac (FLECTOR) 1.3 % PTCH Place 1 patch onto the skin 2 (two) times daily.   prn at prn  . diclofenac sodium (VOLTAREN) 1 % GEL Apply 2 g topically 4 (four) times daily as needed.   prn at prn  . fluocinolone (VANOS) 0.01 % cream Apply 1 application topically 2 (two) times daily.   prn at prn  . hydrOXYzine (ATARAX/VISTARIL) 25 MG tablet Take 25 mg by mouth 2 (two) times daily.   08/09/2016 at 1800  . Hyoscyamine Sulfate SL 0.125 MG SUBL Place 0.125 mg under the tongue every 6 (six) hours as needed.   prn at prn  . levothyroxine (SYNTHROID, LEVOTHROID) 200 MCG tablet Take 225 mcg by mouth daily.   08/09/2016 at 0800  . levothyroxine (SYNTHROID, LEVOTHROID) 25 MCG tablet Take 25 mcg by mouth daily before breakfast.   08/09/2016 at 0800  . lisinopril (PRINIVIL,ZESTRIL) 10 MG tablet Take 10 mg by mouth daily.   08/09/2016 at 0800  . mirabegron ER (MYRBETRIQ) 50 MG TB24 tablet Take 50 mg by mouth daily.  08/09/2016 at 0800  . ondansetron (ZOFRAN-ODT) 4 MG disintegrating tablet Take 4 mg by mouth every 8 (eight) hours as needed for nausea or vomiting.   prn at prn  . pentosan polysulfate (ELMIRON) 100 MG capsule Take 100 mg by mouth 3 (three) times daily.   08/09/2016 at 0800  . pentoxifylline (TRENTAL) 400 MG CR tablet Take 400 mg by mouth 2 (two) times daily.   08/09/2016 at 0800  . pregabalin (LYRICA) 100 MG capsule Take 100 mg by mouth 3 (three) times daily.   08/09/2016 at 2100  . promethazine (PHENERGAN) 12.5 MG tablet Take 1 tablet (12.5 mg total) by mouth every 6 (six) hours as needed for nausea or vomiting. 15 tablet 0 prn at  prn  . ranitidine (ZANTAC) 300 MG tablet Take 300 mg by mouth at bedtime.   08/09/2016 at 2100  . tamsulosin (FLOMAX) 0.4 MG CAPS capsule Take 0.4 mg by mouth daily.   08/09/2016 at 0800  . tiZANidine (ZANAFLEX) 2 MG tablet Take 2 mg by mouth every 8 (eight) hours as needed for muscle spasms.   prn at prn  . trimethoprim (TRIMPEX) 100 MG tablet Take 100 mg by mouth 2 (two) times daily.   08/09/2016 at 1800   Scheduled:  . amitriptyline  50 mg Oral QHS  . Apremilast  10 mg Oral q morning - 10a  . aspirin EC  81 mg Oral Daily  . cefTRIAXone  1 g Intravenous Q24H  . enoxaparin (LOVENOX) injection  40 mg Subcutaneous Q24H  . famotidine  20 mg Oral Daily  . ketorolac      . levothyroxine  225 mcg Oral BH-q7a  . lisinopril  10 mg Oral Daily  . mirabegron ER  50 mg Oral Daily  . oxybutynin  5 mg Oral TID  . pentosan polysulfate  100 mg Oral TID  . pentoxifylline  400 mg Oral BID  . pregabalin  100 mg Oral TID  . tamsulosin  0.4 mg Oral Daily  . trimethoprim  100 mg Oral BID   Assessment: Pharmacy consulted to dose and monitor Rocephin in this 56 year old female who presents to the facility with chief complaint of left flank and abdominal pain  Goal of Therapy:    Plan:  Will start rocephin 1 g IV q24 hours.    Joanna James D 08/11/2016,9:48 AM

## 2016-08-11 NOTE — H&P (Signed)
[facility] [place] [facility]   PATIENT NAME: Joanna James    MR#:  [id number]  DATE OF BIRTH:  10/21/1960  DATE OF ADMISSION:  08/11/2016  PRIMARY CARE PHYSICIAN: Rayetta Humphrey, MD   REQUESTING/REFERRING PHYSICIAN: dr sung  CHIEF COMPLAINT:   Flank pain HISTORY OF PRESENT ILLNESS:  Joanna James  is a 56 y.o. female with past medical history significant for chronic pain syndrome and S/p elective left ureteroscopy (URS) for a 4 mm LLP stone 3/26 stent removal on 3/29 presents with flank pain. Patient reports that yesterday at noon she removed her stent as directed by her urologist. Patient began to have acute pain. She is rating her pain 10 out of 10. She was seen in the emergency room earlier and discharged after several rounds pain medications. CT scan renal protocol did not show evidence of impacted stone. She will be placed under observation for intractable pain.  PAST MEDICAL HISTORY:   Past Medical History:  Diagnosis Date  . Chronic pain syndrome   . Fibromyalgia   . Gout   . Hypertension   . IC (interstitial cystitis)   . Spinal stenosis     PAST SURGICAL HISTORY:   Past Surgical History:  Procedure Laterality Date  . ABDOMINAL HYSTERECTOMY    . APPENDECTOMY    . BLADDER SUSPENSION    . ECTOPIC PREGNANCY SURGERY    . meniscus tear    . ROTATOR CUFF REPAIR    . THYROIDECTOMY    . TUBAL LIGATION      SOCIAL HISTORY:   Social History  Substance Use Topics  . Smoking status: Never Smoker  . Smokeless tobacco: Never Used  . Alcohol use Yes    FAMILY HISTORY:  Gout Heart disease Hypertension    DRUG ALLERGIES:   Allergies  Allergen Reactions  . Amoxicillin Nausea And Vomiting  . Azithromycin Nausea And Vomiting  . Ciprofloxacin Nausea And Vomiting    "Cannot take by mouth, OK by IV"  . Hydrocodone Nausea And Vomiting  . Percocet [Oxycodone-Acetaminophen] Nausea And Vomiting  . Sulfa Antibiotics Nausea And  Vomiting    REVIEW OF SYSTEMS:   Review of Systems  Constitutional: Negative.  Negative for chills, fever and malaise/fatigue.  HENT: Negative.  Negative for ear discharge, ear pain, hearing loss, nosebleeds and sore throat.   Eyes: Negative.  Negative for blurred vision and pain.  Respiratory: Negative.  Negative for cough, hemoptysis, shortness of breath and wheezing.   Cardiovascular: Negative.  Negative for chest pain, palpitations and leg swelling.  Gastrointestinal: Negative.  Negative for abdominal pain, blood in stool, diarrhea, nausea and vomiting.  Genitourinary: Positive for flank pain. Negative for dysuria.  Musculoskeletal: Negative for back pain.  Skin: Negative.   Neurological: Negative for dizziness, tremors, speech change, focal weakness, seizures and headaches.  Endo/Heme/Allergies: Negative.  Does not bruise/bleed easily.  Psychiatric/Behavioral: Negative.  Negative for depression, hallucinations and suicidal ideas.    MEDICATIONS AT HOME:   Prior to Admission medications   Medication Sig Start Date End Date Taking? Authorizing Provider  traMADol (ULTRAM) 50 MG tablet Take 1 tablet (50 mg total) by mouth 3 (three) times daily as needed for moderate pain or severe pain. 06/22/16  Yes Enedina Finner, MD  amitriptyline (ELAVIL) 50 MG tablet Take 50 mg by mouth at bedtime. 02/21/16 02/20/17  Historical Provider, MD  Apremilast 10 & 20 & 30 MG TBPK Take 10 mg by mouth every morning.    Historical Provider,  MD  aspirin EC 81 MG tablet Take 81 mg by mouth daily. 02/23/14   Historical Provider, MD  cephALEXin (KEFLEX) 500 MG capsule Take 1 capsule (500 mg total) by mouth 2 (two) times daily. 08/10/16 08/20/16  Jennye Moccasin, MD  cyclobenzaprine (FLEXERIL) 10 MG tablet Take 1 tablet (10 mg total) by mouth every 8 (eight) hours as needed for muscle spasms. 04/26/15   Evangeline Dakin, PA-C  diclofenac (FLECTOR) 1.3 % PTCH Place 1 patch onto the skin 2 (two) times daily.    Historical  Provider, MD  diclofenac sodium (VOLTAREN) 1 % GEL Apply 2 g topically 4 (four) times daily as needed.    Historical Provider, MD  fluocinolone (VANOS) 0.01 % cream Apply 1 application topically 2 (two) times daily.    Historical Provider, MD  hydrOXYzine (ATARAX/VISTARIL) 25 MG tablet Take 25 mg by mouth 2 (two) times daily.    Historical Provider, MD  Hyoscyamine Sulfate SL 0.125 MG SUBL Place 0.125 mg under the tongue every 6 (six) hours as needed.    Historical Provider, MD  levothyroxine (SYNTHROID, LEVOTHROID) 200 MCG tablet Take 225 mcg by mouth daily. 03/20/16   Historical Provider, MD  levothyroxine (SYNTHROID, LEVOTHROID) 25 MCG tablet Take 25 mcg by mouth daily before breakfast.    Historical Provider, MD  lisinopril (PRINIVIL,ZESTRIL) 10 MG tablet Take 10 mg by mouth daily.    Historical Provider, MD  mirabegron ER (MYRBETRIQ) 50 MG TB24 tablet Take 50 mg by mouth daily.    Historical Provider, MD  ondansetron (ZOFRAN-ODT) 4 MG disintegrating tablet Take 4 mg by mouth every 8 (eight) hours as needed for nausea or vomiting.    Historical Provider, MD  pentosan polysulfate (ELMIRON) 100 MG capsule Take 100 mg by mouth 3 (three) times daily.    Historical Provider, MD  pentoxifylline (TRENTAL) 400 MG CR tablet Take 400 mg by mouth 2 (two) times daily.    Historical Provider, MD  pregabalin (LYRICA) 100 MG capsule Take 100 mg by mouth 3 (three) times daily. 04/11/16   Historical Provider, MD  promethazine (PHENERGAN) 12.5 MG tablet Take 1 tablet (12.5 mg total) by mouth every 6 (six) hours as needed for nausea or vomiting. 05/30/15   Minna Antis, MD  ranitidine (ZANTAC) 300 MG tablet Take 300 mg by mouth at bedtime. 03/09/16 03/09/17  Historical Provider, MD  tamsulosin (FLOMAX) 0.4 MG CAPS capsule Take 0.4 mg by mouth daily.    Historical Provider, MD  tiZANidine (ZANAFLEX) 2 MG tablet Take 2 mg by mouth every 8 (eight) hours as needed for muscle spasms. 04/11/16   Historical Provider,  MD  trimethoprim (TRIMPEX) 100 MG tablet Take 100 mg by mouth 2 (two) times daily.    Historical Provider, MD      VITAL SIGNS:  Blood pressure 125/71, pulse 84, temperature 98.6 F (37 C), temperature source Oral, resp. rate 18, height  (1.6 m), weight 108.9 kg (240 lb), SpO2 98 %.  PHYSICAL EXAMINATION:   Physical Exam  Constitutional: She is oriented to person, place, and time and well-developed, well-nourished, and in no distress. No distress.  HENT:  Head: Normocephalic.  Eyes: No scleral icterus.  Neck: Normal range of motion. Neck supple. No JVD present. No tracheal deviation present.  Cardiovascular: Normal rate, regular rhythm and normal heart sounds.  Exam reveals no gallop and no friction rub.   No murmur heard. Pulmonary/Chest: Effort normal and breath sounds normal. No respiratory distress. She has no wheezes. She  has no rales. She exhibits no tenderness.  Abdominal: Soft. Bowel sounds are normal. She exhibits no distension and no mass. There is no tenderness. There is no rebound and no guarding.  Musculoskeletal: Normal range of motion. She exhibits no edema.  Neurological: She is alert and oriented to person, place, and time.  Skin: Skin is warm. No rash noted. No erythema.  Psychiatric: Affect and judgment normal.      LABORATORY PANEL:   CBC  Recent Labs Lab 08/11/16 0255  WBC 9.0  HGB 12.1  HCT 37.4  PLT 317   ------------------------------------------------------------------------------------------------------------------  Chemistries   Recent Labs Lab 08/10/16 1258 08/11/16 0255  NA 139 141  K 4.1 4.0  CL 105 108  CO2 27 28  GLUCOSE 129* 100*  BUN 16 17  CREATININE 1.48* 1.49*  CALCIUM 9.3 9.2  AST 23  --   ALT 13*  --   ALKPHOS 63  --   BILITOT 1.1  --    ------------------------------------------------------------------------------------------------------------------  Cardiac Enzymes No results for input(s): TROPONINI in the  last 168 hours. ------------------------------------------------------------------------------------------------------------------  RADIOLOGY:  Ct Renal Stone Study  Result Date: 08/10/2016 CLINICAL DATA:  Acute onset of left lower quadrant pain for 2 hours, possible mesenteric inflammation on recent CT scan EXAM: CT ABDOMEN AND PELVIS WITHOUT CONTRAST TECHNIQUE: Multidetector CT imaging of the abdomen and pelvis was performed following the standard protocol without IV contrast. COMPARISON:  CT scan 06/21/2016 and 06/15/2014 FINDINGS: Lower chest: Lung bases shows no acute findings. Small hiatal hernia is stable. Hepatobiliary: Unenhanced liver shows no biliary ductal dilatation. No calcified gallstones are noted within gallbladder. Pancreas: Unenhanced pancreas without focal abnormality. Spleen: Unenhanced spleen without focal abnormality. Adrenals/Urinary Tract: No adrenal gland mass. Again noted nonobstructive calcification in lower pole of the right kidney measures 1.8 mm. Again noted nonobstructive calcified calculus in lower pole of the left kidney measures 4 mm. There is mild left hydronephrosis and left hydroureter. Mild left perinephric and periureteral stranding. No calcified left ureteral calculi are noted. Findings may represent a recent passed left ureteral calculus or left urinary tract infection/ inflammation. No calcified calculi are identified within under distended urinary bladder. Stomach/Bowel: No small bowel obstruction. No pericecal inflammation. Abundant stool noted within cecum. There is a low lying cecum. The appendix is not identified. 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 Moderate stool noted within right colon. Moderate stool noted within hepatic flexure of the colon. Some colonic stool noted within transverse colon. Descending colon is empty collapsed. Scattered diverticula are noted descending colon and proximal sigmoid colon.  Sigmoid colon is empty collapsed. No evidence of distal colitis or diverticulitis. Some colonic gas noted in mid sigmoid colon which is redundant Vascular/Lymphatic: No aortic aneurysm. No retroperitoneal or mesenteric adenopathy Reproductive: The patient is status post history Other: Stable chronic mild stranding of central mesenteric fat. This may be due to chronic mild inflammation or chronic mild mesenteric edema. This is stable from 2016 Musculoskeletal: There are artifacts from patient's large body habitus. There is disc space flattening with vacuum disc phenomenon mild anterior and mild posterior spurring at L5-S1 level. IMPRESSION: 1. Again noted bilateral nonobstructive nephrolithiasis. 2. Mild left hydronephrosis and left hydroureter. No calcified left ureteral calculi are noted. There is left periureteral stranding. Findings highly suspicious for a recent passed left ureteral calculus or left urinary tract infection/inflammation. Clinical correlation is necessary. 3. No calcified calculi are noted within under distended urinary bladder. 4. Low lying cecum. Abundant stool noted  within cecum. No pericecal inflammation. The appendix is not identified. Moderate stool noted in right colon and splenic flexure of the colon. 5. Few diverticula are noted descending colon. No evidence of distal colitis or acute diverticulitis. 6. Stable mild stranding of central mesenteric fat which may be due to chronic mild inflammation or mild mesenteric edema. 7. Degenerative changes lumbar spine at L5-S1 level. Electronically Signed   By: Natasha Mead M.D.   On: 08/10/2016 13:25    EKG:   Orders placed or performed in visit on 06/14/14  . EKG 12-Lead    IMPRESSION AND PLAN:   56 year old female status post elective left ureteroscopy for a 4 mm LLP stone 3/26 who presents with ureteral pain.  1. Intractable pain from ureteral stent that was removed on March 29 and history of interstitial cystitis. Appreciate urology  consult Continue supportive care with pain medications  2. UTI: Continue Rocephin and follow up on urine culture  3. Fibromyalgia with chronic pain syndrome: Continue tramadol    All the records are reviewed and case discussed with ED provider. Management plans discussed with the patient and she isin agreement  CODE STATUS: full  TOTAL TIME TAKING CARE OF THIS PATIENT: 40 minutes.    Oleg Oleson M.D on 08/11/2016 at 11:25 AM  Between 7am to 6pm - Pager - [phone]  After 6pm go to [email or web address] - password [email or web address]  [facility]  Office  [phone]  CC: Primary care physician; Rayetta Humphrey, MD

## 2016-08-12 ENCOUNTER — Observation Stay: Payer: Medicare Other

## 2016-08-12 DIAGNOSIS — N132 Hydronephrosis with renal and ureteral calculous obstruction: Secondary | ICD-10-CM | POA: Diagnosis not present

## 2016-08-12 LAB — CBC WITH DIFFERENTIAL/PLATELET
BASOS ABS: 0.1 10*3/uL (ref 0–0.1)
Basophils Relative: 1 %
EOS PCT: 4 %
Eosinophils Absolute: 0.3 10*3/uL (ref 0–0.7)
HCT: 34.2 % — ABNORMAL LOW (ref 35.0–47.0)
Hemoglobin: 11.1 g/dL — ABNORMAL LOW (ref 12.0–16.0)
LYMPHS PCT: 26 %
Lymphs Abs: 1.9 10*3/uL (ref 1.0–3.6)
MCH: 27.9 pg (ref 26.0–34.0)
MCHC: 32.6 g/dL (ref 32.0–36.0)
MCV: 85.4 fL (ref 80.0–100.0)
MONO ABS: 0.5 10*3/uL (ref 0.2–0.9)
MONOS PCT: 7 %
Neutro Abs: 4.7 10*3/uL (ref 1.4–6.5)
Neutrophils Relative %: 62 %
PLATELETS: 295 10*3/uL (ref 150–440)
RBC: 4 MIL/uL (ref 3.80–5.20)
RDW: 13.2 % (ref 11.5–14.5)
WBC: 7.4 10*3/uL (ref 3.6–11.0)

## 2016-08-12 LAB — BASIC METABOLIC PANEL
ANION GAP: 4 — AB (ref 5–15)
BUN: 15 mg/dL (ref 6–20)
CALCIUM: 8.4 mg/dL — AB (ref 8.9–10.3)
CO2: 24 mmol/L (ref 22–32)
CREATININE: 1.24 mg/dL — AB (ref 0.44–1.00)
Chloride: 112 mmol/L — ABNORMAL HIGH (ref 101–111)
GFR calc Af Amer: 55 mL/min — ABNORMAL LOW (ref >60)
GFR, EST NON AFRICAN AMERICAN: 48 mL/min — AB (ref >60)
GLUCOSE: 98 mg/dL (ref 65–99)
Potassium: 3.6 mmol/L (ref 3.5–5.1)
Sodium: 140 mmol/L (ref 135–145)

## 2016-08-12 LAB — HIV ANTIBODY (ROUTINE TESTING W REFLEX): HIV SCREEN 4TH GENERATION: NONREACTIVE

## 2016-08-12 MED ORDER — OXYBUTYNIN CHLORIDE 5 MG PO TABS: 5.0000 mg | ORAL_TABLET | Freq: Three times a day (TID) | ORAL | 0 refills | Status: DC

## 2016-08-12 MED ORDER — CEPHALEXIN 500 MG PO CAPS: 500.0000 mg | ORAL_CAPSULE | Freq: Two times a day (BID) | ORAL | 0 refills | Status: DC

## 2016-08-12 MED ORDER — ENOXAPARIN SODIUM 40 MG/0.4ML ~~LOC~~ SOLN: 40.0000 mg | Freq: Two times a day (BID) | SUBCUTANEOUS | Status: DC

## 2016-08-12 MED ORDER — PHENAZOPYRIDINE HCL 100 MG PO TABS: 100.0000 mg | ORAL_TABLET | Freq: Three times a day (TID) | ORAL | 0 refills | Status: DC

## 2016-08-12 MED ORDER — PHENAZOPYRIDINE HCL 100 MG PO TABS
100.0000 mg | ORAL_TABLET | Freq: Three times a day (TID) | ORAL | Status: DC
  Administered 2016-08-12 – 2016-08-13 (×2): 100 mg via ORAL
  Filled 2016-08-12 (×2): qty 1

## 2016-08-12 MED ORDER — BELLADONNA ALKALOIDS-OPIUM 16.2-60 MG RE SUPP: 1.0000 | Freq: Three times a day (TID) | RECTAL | Status: DC | PRN

## 2016-08-12 NOTE — Discharge Summary (Addendum)
[facility] [place] [facility]   PATIENT NAME: Joanna James    MR#:  [id number]  DATE OF BIRTH:  1961-04-09  DATE OF ADMISSION:  08/11/2016 ADMITTING PHYSICIAN: Adrian Saran, MD  DATE OF DISCHARGE: 08/13/2016 PRIMARY CARE PHYSICIAN: Rayetta Humphrey, MD    ADMISSION DIAGNOSIS:  Left flank pain [R10.9] Intractable pain [R52]  DISCHARGE DIAGNOSIS:  Active Problems:   Pain   SECONDARY DIAGNOSIS:   Past Medical History:  Diagnosis Date  . Chronic pain syndrome   . Fibromyalgia   . Gout   . Hypertension   . IC (interstitial cystitis)   . Spinal stenosis     HOSPITAL COURSE:   56 year old female status post elective left ureteroscopy for a 4 mm LLP stone 3/26 who presents with ureteral pain.  1. Intractable pain from ureteral stent that was removed on March 29 and history of interstitial cystitis: Patient was admitted for pain control. Her symptoms Initially improved. She had a repeat ultrasound which shows very minimal hydronephrosis. Due to ongoing pain, She underwent ureteral stent. She Will need to follow-up with her urologist in 4-6 weeks.  2. UTI: Her urine culture actually showed no growth. She will not require antibiotics at discharge.  3. Fibromyalgia with chronic pain syndrome: Continue tramadol And other pain medications Patient has had several ER visits and hospitalizations in the past due to "pain".   DISCHARGE CONDITIONS AND DIET:   Stable for discharge and regular diet  CONSULTS OBTAINED:  Treatment Team:  Jerilee Field, MD  DRUG ALLERGIES:   Allergies  Allergen Reactions  . Amoxicillin Nausea And Vomiting  . Azithromycin Nausea And Vomiting  . Ciprofloxacin Nausea And Vomiting    "Cannot take by mouth, OK by IV"  . Hydrocodone Nausea And Vomiting  . Percocet [Oxycodone-Acetaminophen] Nausea And Vomiting  . Sulfa Antibiotics Nausea And Vomiting    DISCHARGE MEDICATIONS:   Discharge Medication List as of  08/12/2016  2:14 PM    START taking these medications   Details  oxybutynin (DITROPAN) 5 MG tablet Take 1 tablet (5 mg total) by mouth 3 (three) times daily., Starting Sat 08/12/2016, Until Fri 08/18/2016, Print      CONTINUE these medications which have CHANGED   Details  cephALEXin (KEFLEX) 500 MG capsule Take 1 capsule (500 mg total) by mouth 2 (two) times daily., Starting Sat 08/12/2016, Until Tue 08/22/2016, Print      CONTINUE these medications which have NOT CHANGED   Details  amitriptyline (ELAVIL) 50 MG tablet Take 50 mg by mouth at bedtime., Starting Mon 02/21/2016, Until Tue 02/20/2017, Historical Med    Apremilast 10 & 20 & 30 MG TBPK Take 10 mg by mouth every morning., Historical Med    aspirin EC 81 MG tablet Take 81 mg by mouth daily., Starting Mon 02/23/2014, Historical Med    cyclobenzaprine (FLEXERIL) 10 MG tablet Take 1 tablet (10 mg total) by mouth every 8 (eight) hours as needed for muscle spasms., Starting Mon 04/26/2015, Print    diclofenac (FLECTOR) 1.3 % PTCH Place 1 patch onto the skin 2 (two) times daily., Historical Med    diclofenac sodium (VOLTAREN) 1 % GEL Apply 2 g topically 4 (four) times daily as needed., Historical Med    fluocinolone (VANOS) 0.01 % cream Apply 1 application topically 2 (two) times daily., Historical Med    hydrOXYzine (ATARAX/VISTARIL) 25 MG tablet Take 25 mg by mouth 2 (two) times daily., Historical Med    Hyoscyamine Sulfate SL 0.125  MG SUBL Place 0.125 mg under the tongue every 6 (six) hours as needed., Historical Med    !! levothyroxine (SYNTHROID, LEVOTHROID) 200 MCG tablet Take 225 mcg by mouth daily., Starting Mon 03/20/2016, Historical Med    !! levothyroxine (SYNTHROID, LEVOTHROID) 25 MCG tablet Take 25 mcg by mouth daily before breakfast., Historical Med    lisinopril (PRINIVIL,ZESTRIL) 10 MG tablet Take 10 mg by mouth daily., Historical Med    mirabegron ER (MYRBETRIQ) 50 MG TB24 tablet Take 50 mg by mouth daily., Historical  Med    ondansetron (ZOFRAN-ODT) 4 MG disintegrating tablet Take 4 mg by mouth every 8 (eight) hours as needed for nausea or vomiting., Historical Med    pentosan polysulfate (ELMIRON) 100 MG capsule Take 100 mg by mouth 3 (three) times daily., Historical Med    pentoxifylline (TRENTAL) 400 MG CR tablet Take 400 mg by mouth 2 (two) times daily., Historical Med    pregabalin (LYRICA) 100 MG capsule Take 100 mg by mouth 3 (three) times daily., Starting Tue 04/11/2016, Historical Med    promethazine (PHENERGAN) 12.5 MG tablet Take 1 tablet (12.5 mg total) by mouth every 6 (six) hours as needed for nausea or vomiting., Starting Sun 05/30/2015, Print    ranitidine (ZANTAC) 300 MG tablet Take 300 mg by mouth at bedtime., Starting Thu 03/09/2016, Until Fri 03/09/2017, Historical Med    tamsulosin (FLOMAX) 0.4 MG CAPS capsule Take 0.4 mg by mouth daily., Historical Med    tiZANidine (ZANAFLEX) 2 MG tablet Take 2 mg by mouth every 8 (eight) hours as needed for muscle spasms., Starting Tue 04/11/2016, Historical Med    traMADol (ULTRAM) 50 MG tablet Take 1 tablet (50 mg total) by mouth 3 (three) times daily as needed for moderate pain or severe pain., Starting Thu 06/22/2016, Normal    trimethoprim (TRIMPEX) 100 MG tablet Take 100 mg by mouth 2 (two) times daily., Historical Med     !! - Potential duplicate medications found. Please discuss with provider.        Today   CHIEF COMPLAINT:  Pain is 2 out of 10. Pain much better after bowel movement   VITAL SIGNS:  Blood pressure (!) 96/50, pulse 75, temperature 97.5 F (36.4 C), temperature source Oral, resp. rate 18, height  (1.6 m), weight 108.9 kg (240 lb), SpO2 92 %.   REVIEW OF SYSTEMS:  Review of Systems  Constitutional: Negative.  Negative for chills, fever and malaise/fatigue.  HENT: Negative.  Negative for ear discharge, ear pain, hearing loss, nosebleeds and sore throat.   Eyes: Negative.  Negative for blurred vision and  pain.  Respiratory: Negative.  Negative for cough, hemoptysis, shortness of breath and wheezing.   Cardiovascular: Negative.  Negative for chest pain, palpitations and leg swelling.  Gastrointestinal: Negative.  Negative for abdominal pain, blood in stool, diarrhea, nausea and vomiting.  Genitourinary: Positive for flank pain (better). Negative for dysuria.  Musculoskeletal: Negative for back pain.  Skin: Negative.   Neurological: Negative for dizziness, tremors, speech change, focal weakness, seizures and headaches.  Endo/Heme/Allergies: Negative.  Does not bruise/bleed easily.  Psychiatric/Behavioral: Negative.  Negative for depression, hallucinations and suicidal ideas.     PHYSICAL EXAMINATION:  GENERAL:  56 y.o.-year-old patient lying in the bed with no acute distress.  NECK:  Supple, no jugular venous distention. No thyroid enlargement, no tenderness.  LUNGS: Normal breath sounds bilaterally, no wheezing, rales,rhonchi  No use of accessory muscles of respiration.  CARDIOVASCULAR: S1, S2 normal. No murmurs, rubs,  or gallops.  ABDOMEN: Soft, non-tender, non-distended. Bowel sounds present. No organomegaly or mass.  EXTREMITIES: No pedal edema, cyanosis, or clubbing.  PSYCHIATRIC: The patient is alert and oriented x 3.  SKIN: No obvious rash, lesion, or ulcer.   DATA REVIEW:   CBC  Recent Labs Lab 08/12/16 0914  WBC 7.4  HGB 11.1*  HCT 34.2*  PLT 295    Chemistries   Recent Labs Lab 08/10/16 1258  08/12/16 0914  NA 139  < > 140  K 4.1  < > 3.6  CL 105  < > 112*  CO2 27  < > 24  GLUCOSE 129*  < > 98  BUN 16  < > 15  CREATININE 1.48*  < > 1.24*  CALCIUM 9.3  < > 8.4*  AST 23  --   --   ALT 13*  --   --   ALKPHOS 63  --   --   BILITOT 1.1  --   --   < > = values in this interval not displayed.  Cardiac Enzymes No results for input(s): TROPONINI in the last 168 hours.  Microbiology Results  @  RADIOLOGY:  US Renal  Result Date:  08/12/2016 CLINICAL DATA:  Hydronephrosis EXAM: RENAL / URINARY TRACT ULTRASOUND COMPLETE COMPARISON:  08/10/2016 FINDINGS: Right Kidney: Length: 9 cm. Echogenicity within normal limits. No mass or hydronephrosis visualized. 4 mm lower pole renal calculus. Left Kidney: Length: 11.5 cm. Echogenicity within normal limits. No mass. 4 mm lower pole renal calculus. Mild hydronephrosis. Bladder: Appears normal for degree of bladder distention. Prevoid bladder volume 259 mL. Postvoid bladder volume 255 mL. IMPRESSION: 1. 4 mm left lower pole renal calculus.  Mild left hydronephrosis. 2. Nonobstructing 4 mm right lower pole renal calculus. Electronically Signed   By: Elige Ko   On: 08/12/2016 08:20      Discharge Medication List as of 08/12/2016  2:14 PM    START taking these medications   Details  oxybutynin (DITROPAN) 5 MG tablet Take 1 tablet (5 mg total) by mouth 3 (three) times daily., Starting Sat 08/12/2016, Until Fri 08/18/2016, Print      CONTINUE these medications which have CHANGED   Details  cephALEXin (KEFLEX) 500 MG capsule Take 1 capsule (500 mg total) by mouth 2 (two) times daily., Starting Sat 08/12/2016, Until Tue 08/22/2016, Print      CONTINUE these medications which have NOT CHANGED   Details  amitriptyline (ELAVIL) 50 MG tablet Take 50 mg by mouth at bedtime., Starting Mon 02/21/2016, Until Tue 02/20/2017, Historical Med    Apremilast 10 & 20 & 30 MG TBPK Take 10 mg by mouth every morning., Historical Med    aspirin EC 81 MG tablet Take 81 mg by mouth daily., Starting Mon 02/23/2014, Historical Med    cyclobenzaprine (FLEXERIL) 10 MG tablet Take 1 tablet (10 mg total) by mouth every 8 (eight) hours as needed for muscle spasms., Starting Mon 04/26/2015, Print    diclofenac (FLECTOR) 1.3 % PTCH Place 1 patch onto the skin 2 (two) times daily., Historical Med    diclofenac sodium (VOLTAREN) 1 % GEL Apply 2 g topically 4 (four) times daily as needed., Historical Med     fluocinolone (VANOS) 0.01 % cream Apply 1 application topically 2 (two) times daily., Historical Med    hydrOXYzine (ATARAX/VISTARIL) 25 MG tablet Take 25 mg by mouth 2 (two) times daily., Historical Med    Hyoscyamine Sulfate SL 0.125 MG SUBL Place 0.125 mg under  the tongue every 6 (six) hours as needed., Historical Med    !! levothyroxine (SYNTHROID, LEVOTHROID) 200 MCG tablet Take 225 mcg by mouth daily., Starting Mon 03/20/2016, Historical Med    !! levothyroxine (SYNTHROID, LEVOTHROID) 25 MCG tablet Take 25 mcg by mouth daily before breakfast., Historical Med    lisinopril (PRINIVIL,ZESTRIL) 10 MG tablet Take 10 mg by mouth daily., Historical Med    mirabegron ER (MYRBETRIQ) 50 MG TB24 tablet Take 50 mg by mouth daily., Historical Med    ondansetron (ZOFRAN-ODT) 4 MG disintegrating tablet Take 4 mg by mouth every 8 (eight) hours as needed for nausea or vomiting., Historical Med    pentosan polysulfate (ELMIRON) 100 MG capsule Take 100 mg by mouth 3 (three) times daily., Historical Med    pentoxifylline (TRENTAL) 400 MG CR tablet Take 400 mg by mouth 2 (two) times daily., Historical Med    pregabalin (LYRICA) 100 MG capsule Take 100 mg by mouth 3 (three) times daily., Starting Tue 04/11/2016, Historical Med    promethazine (PHENERGAN) 12.5 MG tablet Take 1 tablet (12.5 mg total) by mouth every 6 (six) hours as needed for nausea or vomiting., Starting Sun 05/30/2015, Print    ranitidine (ZANTAC) 300 MG tablet Take 300 mg by mouth at bedtime., Starting Thu 03/09/2016, Until Fri 03/09/2017, Historical Med    tamsulosin (FLOMAX) 0.4 MG CAPS capsule Take 0.4 mg by mouth daily., Historical Med    tiZANidine (ZANAFLEX) 2 MG tablet Take 2 mg by mouth every 8 (eight) hours as needed for muscle spasms., Starting Tue 04/11/2016, Historical Med    traMADol (ULTRAM) 50 MG tablet Take 1 tablet (50 mg total) by mouth 3 (three) times daily as needed for moderate pain or severe pain., Starting Thu  06/22/2016, Normal    trimethoprim (TRIMPEX) 100 MG tablet Take 100 mg by mouth 2 (two) times daily., Historical Med     !! - Potential duplicate medications found. Please discuss with provider.        Management plans discussed with the patient and she is in agreement. Stable for discharge home  Patient should follow up with pcp and urology  CODE STATUS:     Code Status Orders        Start     Ordered   08/11/16 0923  Full code  Continuous     08/11/16 0922    Code Status History    Date Active Date Inactive Code Status Order ID Comments User Context   06/21/2016 10:42 AM 06/22/2016  7:42 PM Full Code [id number]  Katha Hamming, MD ED      TOTAL TIME TAKING CARE OF THIS PATIENT: 37 minutes.    Note: This dictation was prepared with Dragon dictation along with smaller phrase technology. Any transcriptional errors that result from this process are unintentional.  Rena Hunke M.D on 08/14/2016 at 6:55 AM  Between 7am to 6pm - Pager - [phone] After 6pm go to [email or web address] - password [email or web address]  [facility]  Office  [phone]  CC: Primary care physician; Rayetta Humphrey, MD

## 2016-08-12 NOTE — Progress Notes (Signed)
56 y/o F with BMI > 40 ordered Lovenox 40 mg daily. Will increase Lovenox to 40 mg bid as per protocol.   Luisa Hart, PharmD Clinical Pharmacist

## 2016-08-12 NOTE — Progress Notes (Signed)
Subjective: Patient reports she has left flank radiating to LLQ again. Her pain was better this morning. She was set for discharge and the pain returned. She continues to have pain this evening. This pain is different from the typical IC pain (which is in the front or SP area). She has dark tea colored urine.   Objective: Vital signs in last 24 hours: Temp:  [98.1 F (36.7 C)-98.7 F (37.1 C)] 98.7 F (37.1 C) (03/31 2021) Pulse Rate:  [90-93] 92 (03/31 2021) Resp:  [18-20] 18 (03/31 2021) BP: (104-116)/(61-68) 114/61 (03/31 2021) SpO2:  [95 %-99 %] 96 % (03/31 2021)  Intake/Output from previous day: 03/30 0701 - 03/31 0700 In: 1546.3 [I.V.:1496.3; IV Piggyback:50] Out: 450 [Urine:400; Emesis/NG output:50] Intake/Output this shift: Total I/O In: -  Out: 100 [Urine:100]  Physical Exam:  NAD In bed Neuro - no focal deficits Resp - unlabored   Lab Results:  Recent Labs  08/11/16 0255 08/11/16 1441 08/12/16 0914  HGB 12.1 13.8 11.1*  HCT 37.4 42.5 34.2*   BMET  Recent Labs  08/11/16 0255 08/11/16 1441 08/12/16 0914  NA 141  --  140  K 4.0  --  3.6  CL 108  --  112*  CO2 28  --  24  GLUCOSE 100*  --  98  BUN 17  --  15  CREATININE 1.49* 1.33* 1.24*  CALCIUM 9.2  --  8.4*   No results for input(s): LABPT, INR in the last 72 hours. No results for input(s): LABURIN in the last 72 hours. Results for orders placed or performed during the hospital encounter of 05/30/15  Urine culture     Status: None   Collection Time: 05/30/15  5:38 PM  Result Value Ref Range Status   Specimen Description URINE, RANDOM  Final   Special Requests NONE  Final   Culture MULTIPLE SPECIES PRESENT, SUGGEST RECOLLECTION  Final   Report Status 06/01/2015 FINAL  Final    Studies/Results: US Renal  Result Date: 08/12/2016 CLINICAL DATA:  Hydronephrosis EXAM: RENAL / URINARY TRACT ULTRASOUND COMPLETE COMPARISON:  08/10/2016 FINDINGS: Right Kidney: Length: 9 cm. Echogenicity within  normal limits. No mass or hydronephrosis visualized. 4 mm lower pole renal calculus. Left Kidney: Length: 11.5 cm. Echogenicity within normal limits. No mass. 4 mm lower pole renal calculus. Mild hydronephrosis. Bladder: Appears normal for degree of bladder distention. Prevoid bladder volume 259 mL. Postvoid bladder volume 255 mL. IMPRESSION: 1. 4 mm left lower pole renal calculus.  Mild left hydronephrosis. 2. Nonobstructing 4 mm right lower pole renal calculus. Electronically Signed   By: Elige Ko   On: 08/12/2016 08:20    Assessment/Plan: S/p elective left ureteroscopy (URS) for a 4 mm LLP stone 3/26 with [facility]/Dr. Logan Bores. Stent removed 3/29 with continued left flank and LLQ pain. She continues to have pain preventing discharge. It's in a similar location since the stent was removed. I discussed again with the patient the nature, potential benefits, risks and alternatives to cystoscopy, LEFT RGP and Left ureteral stent placement, including side effects of the proposed treatment, the likelihood of the patient achieving the goals of the procedure, and any potential problems that might occur during the procedure or recuperation. All questions answered. Patient elects to proceed with stent replacement in AM. She is OK with or without the string (she had a stent without a string on one occasion with Dr. Reuel Boom here in [place]). I discussed with her my partner Dr. Annabell Howells will be doing the  procedure and I discussed the patient with Dr. Annabell Howells.     LOS: 0 days   Joanna James 08/12/2016, 10:06 PM

## 2016-08-12 NOTE — Progress Notes (Addendum)
I spoke with pt's nurse Josh and Dr. Juliene Pina. Pt's pain much improved today. U/S shows mild left hydro, labs stable with improved wbc and Cr. I reviewed labs and images. She can eat and be discharged from GU pt of view. I put f/u with Dr. Logan Bores in discharge providers (standard procedure to f/u with ultrasound to ensure hydro resolves).

## 2016-08-12 NOTE — Progress Notes (Signed)
Pt has had discharge orders for the majority of the day. Nursing has attempted to discharge patient on 4 separate occasions but each time patient has verbalized a new concern. This RN has had each MD speak to the patient and ensure her that she is able to discharge. She consistently has new reasons why she feels uncomfortable discharging but agrees that there is no treatment that she is receiving here that she could not get at home. hospitalist has added new p.o meds to help and preferred to send her home with these meds. Pt ultimately called her MD at another hospital and according to her this MD stated we should not discharge her. Rounding hospitalist ultimately decided to allow pt to stay another night and discharge early in am.

## 2016-08-13 ENCOUNTER — Observation Stay: Payer: Medicare Other | Admitting: Anesthesiology

## 2016-08-13 ENCOUNTER — Encounter
Admission: EM | Disposition: A | Discharge status: Home / Self Care | Payer: Self-pay | Source: Home / Self Care | Attending: Emergency Medicine

## 2016-08-13 DIAGNOSIS — N1330 Unspecified hydronephrosis: Secondary | ICD-10-CM | POA: Diagnosis not present

## 2016-08-13 DIAGNOSIS — N132 Hydronephrosis with renal and ureteral calculous obstruction: Secondary | ICD-10-CM | POA: Diagnosis not present

## 2016-08-13 HISTORY — PX: CYSTOSCOPY WITH STENT PLACEMENT: SHX5790

## 2016-08-13 LAB — URINE CULTURE: Culture: NO GROWTH

## 2016-08-13 LAB — SURGICAL PCR SCREEN
MRSA, PCR: NEGATIVE
STAPHYLOCOCCUS AUREUS: NEGATIVE

## 2016-08-13 SURGERY — CYSTOSCOPY, WITH STENT INSERTION
Anesthesia: General | Laterality: Left

## 2016-08-13 MED ORDER — PHENYLEPHRINE HCL 10 MG/ML IJ SOLN
INTRAMUSCULAR | Status: DC | PRN
  Administered 2016-08-13: 100 ug via INTRAVENOUS

## 2016-08-13 MED ORDER — PROPOFOL 10 MG/ML IV BOLUS
INTRAVENOUS | Status: DC | PRN
  Administered 2016-08-13: 150 mg via INTRAVENOUS

## 2016-08-13 MED ORDER — PHENYLEPHRINE HCL 10 MG/ML IJ SOLN
INTRAMUSCULAR | Status: AC
  Filled 2016-08-13: qty 1

## 2016-08-13 MED ORDER — ONDANSETRON HCL 4 MG/2ML IJ SOLN
INTRAMUSCULAR | Status: DC | PRN
  Administered 2016-08-13: 4 mg via INTRAVENOUS

## 2016-08-13 MED ORDER — PROPOFOL 10 MG/ML IV BOLUS
INTRAVENOUS | Status: AC
  Filled 2016-08-13: qty 20

## 2016-08-13 MED ORDER — MIDAZOLAM HCL 2 MG/2ML IJ SOLN
INTRAMUSCULAR | Status: DC | PRN
  Administered 2016-08-13: 2 mg via INTRAVENOUS

## 2016-08-13 MED ORDER — FENTANYL CITRATE (PF) 100 MCG/2ML IJ SOLN: 25.0000 ug | INTRAMUSCULAR | Status: DC | PRN

## 2016-08-13 MED ORDER — LIDOCAINE HCL (CARDIAC) 20 MG/ML IV SOLN
INTRAVENOUS | Status: DC | PRN
  Administered 2016-08-13: 50 mg via INTRAVENOUS

## 2016-08-13 MED ORDER — KETOROLAC TROMETHAMINE 30 MG/ML IJ SOLN
INTRAMUSCULAR | Status: AC
  Filled 2016-08-13: qty 1

## 2016-08-13 MED ORDER — LACTATED RINGERS IV SOLN
INTRAVENOUS | Status: DC | PRN
  Administered 2016-08-13: 10:00:00 via INTRAVENOUS

## 2016-08-13 MED ORDER — DEXAMETHASONE SODIUM PHOSPHATE 10 MG/ML IJ SOLN
INTRAMUSCULAR | Status: DC | PRN
  Administered 2016-08-13: 10 mg via INTRAVENOUS

## 2016-08-13 MED ORDER — LIDOCAINE HCL (PF) 2 % IJ SOLN
INTRAMUSCULAR | Status: AC
  Filled 2016-08-13: qty 2

## 2016-08-13 MED ORDER — FENTANYL CITRATE (PF) 100 MCG/2ML IJ SOLN
INTRAMUSCULAR | Status: AC
  Filled 2016-08-13: qty 2

## 2016-08-13 MED ORDER — KETOROLAC TROMETHAMINE 30 MG/ML IJ SOLN
INTRAMUSCULAR | Status: DC | PRN
  Administered 2016-08-13: 30 mg via INTRAVENOUS

## 2016-08-13 MED ORDER — FENTANYL CITRATE (PF) 100 MCG/2ML IJ SOLN
INTRAMUSCULAR | Status: DC | PRN
  Administered 2016-08-13: 50 ug via INTRAVENOUS

## 2016-08-13 MED ORDER — MIDAZOLAM HCL 2 MG/2ML IJ SOLN
INTRAMUSCULAR | Status: AC
  Filled 2016-08-13: qty 2

## 2016-08-13 MED ORDER — BELLADONNA ALKALOIDS-OPIUM 16.2-60 MG RE SUPP: 1.0000 | Freq: Three times a day (TID) | RECTAL | 0 refills | Status: DC | PRN

## 2016-08-13 MED ORDER — ONDANSETRON HCL 4 MG/2ML IJ SOLN: 4.0000 mg | Freq: Once | INTRAMUSCULAR | Status: DC | PRN

## 2016-08-13 SURGICAL SUPPLY — 21 items
BAG DRAIN CYSTO-URO LG1000N (MISCELLANEOUS) ×2 IMPLANT
CANISTER SUCT LVC 12 LTR MEDI- (MISCELLANEOUS) ×2 IMPLANT
CATH URETL 5X70 OPEN END (CATHETERS) ×2 IMPLANT
CONRAY 43 FOR UROLOGY 50M (MISCELLANEOUS) ×2 IMPLANT
DRAPE XRAY CASSETTE 23X24 (DRAPES) ×2 IMPLANT
GOWN STRL REUS W/ TWL LRG LVL4 (GOWN DISPOSABLE) ×1 IMPLANT
GOWN STRL REUS W/ TWL XL LVL3 (GOWN DISPOSABLE) ×1 IMPLANT
GOWN STRL REUS W/TWL LRG LVL4 (GOWN DISPOSABLE) ×2
GOWN STRL REUS W/TWL XL LVL3 (GOWN DISPOSABLE) ×2
KIT RM TURNOVER CYSTO AR (KITS) ×2 IMPLANT
NS IRRIG 500ML POUR BTL (IV SOLUTION) ×2 IMPLANT
PACK CYSTO AR (MISCELLANEOUS) ×2 IMPLANT
SENSORWIRE 0.038 NOT ANGLED (WIRE) ×4
SET CYSTO W/LG BORE CLAMP LF (SET/KITS/TRAYS/PACK) ×2 IMPLANT
SOL .9 NS 3000ML IRR  AL (IV SOLUTION) ×1
SOL .9 NS 3000ML IRR AL (IV SOLUTION) ×1
SOL .9 NS 3000ML IRR UROMATIC (IV SOLUTION) ×1 IMPLANT
STENT URET 6FRX24 CONTOUR (STENTS) ×2 IMPLANT
STENT URETERAL METAL 6X24 (Stent) ×2 IMPLANT
WATER STERILE IRR 1000ML POUR (IV SOLUTION) ×2 IMPLANT
WIRE SENSOR 0.038 NOT ANGLED (WIRE) ×2 IMPLANT

## 2016-08-13 NOTE — H&P (View-Only) (Signed)
Subjective: Patient reports she has left flank radiating to LLQ again. Her pain was better this morning. She was set for discharge and the pain returned. She continues to have pain this evening. This pain is different from the typical IC pain (which is in the front or SP area). She has dark tea colored urine.   Objective: Vital signs in last 24 hours: Temp:  [98.1 F (36.7 C)-98.7 F (37.1 C)] 98.7 F (37.1 C) (03/31 2021) Pulse Rate:  [90-93] 92 (03/31 2021) Resp:  [18-20] 18 (03/31 2021) BP: (104-116)/(61-68) 114/61 (03/31 2021) SpO2:  [95 %-99 %] 96 % (03/31 2021)  Intake/Output from previous day: 03/30 0701 - 03/31 0700 In: 1546.3 [I.V.:1496.3; IV Piggyback:50] Out: 450 [Urine:400; Emesis/NG output:50] Intake/Output this shift: Total I/O In: -  Out: 100 [Urine:100]  Physical Exam:  NAD In bed Neuro - no focal deficits Resp - unlabored   Lab Results:  Recent Labs  08/11/16 0255 08/11/16 1441 08/12/16 0914  HGB 12.1 13.8 11.1*  HCT 37.4 42.5 34.2*   BMET  Recent Labs  08/11/16 0255 08/11/16 1441 08/12/16 0914  NA 141  --  140  K 4.0  --  3.6  CL 108  --  112*  CO2 28  --  24  GLUCOSE 100*  --  98  BUN 17  --  15  CREATININE 1.49* 1.33* 1.24*  CALCIUM 9.2  --  8.4*   No results for input(s): LABPT, INR in the last 72 hours. No results for input(s): LABURIN in the last 72 hours. Results for orders placed or performed during the hospital encounter of 05/30/15  Urine culture     Status: None   Collection Time: 05/30/15  5:38 PM  Result Value Ref Range Status   Specimen Description URINE, RANDOM  Final   Special Requests NONE  Final   Culture MULTIPLE SPECIES PRESENT, SUGGEST RECOLLECTION  Final   Report Status 06/01/2015 FINAL  Final    Studies/Results: US Renal  Result Date: 08/12/2016 CLINICAL DATA:  Hydronephrosis EXAM: RENAL / URINARY TRACT ULTRASOUND COMPLETE COMPARISON:  08/10/2016 FINDINGS: Right Kidney: Length: 9 cm. Echogenicity within  normal limits. No mass or hydronephrosis visualized. 4 mm lower pole renal calculus. Left Kidney: Length: 11.5 cm. Echogenicity within normal limits. No mass. 4 mm lower pole renal calculus. Mild hydronephrosis. Bladder: Appears normal for degree of bladder distention. Prevoid bladder volume 259 mL. Postvoid bladder volume 255 mL. IMPRESSION: 1. 4 mm left lower pole renal calculus.  Mild left hydronephrosis. 2. Nonobstructing 4 mm right lower pole renal calculus. Electronically Signed   By: Elige Ko   On: 08/12/2016 08:20    Assessment/Plan: S/p elective left ureteroscopy (URS) for a 4 mm LLP stone 3/26 with [facility]/Dr. Logan Bores. Stent removed 3/29 with continued left flank and LLQ pain. She continues to have pain preventing discharge. It's in a similar location since the stent was removed. I discussed again with the patient the nature, potential benefits, risks and alternatives to cystoscopy, LEFT RGP and Left ureteral stent placement, including side effects of the proposed treatment, the likelihood of the patient achieving the goals of the procedure, and any potential problems that might occur during the procedure or recuperation. All questions answered. Patient elects to proceed with stent replacement in AM. She is OK with or without the string (she had a stent without a string on one occasion with Dr. Reuel Boom here in [place]). I discussed with her my partner Dr. Annabell Howells will be doing the  procedure and I discussed the patient with Dr. Wrenn.     LOS: 0 days   Gillian Kluever 08/12/2016, 10:06 PM   

## 2016-08-13 NOTE — Progress Notes (Signed)
[employer] [facility] [facility] [place] [facility]   PATIENT NAME: Joanna James    MR#:  [id number]  DATE OF BIRTH:  25-Jun-1960  SUBJECTIVE:  Symptoms improved  REVIEW OF SYSTEMS:    Review of Systems  Constitutional: Negative.  Negative for chills, fever and malaise/fatigue.  HENT: Negative.  Negative for ear discharge, ear pain, hearing loss, nosebleeds and sore throat.   Eyes: Negative.  Negative for blurred vision and pain.  Respiratory: Negative.  Negative for cough, hemoptysis, shortness of breath and wheezing.   Cardiovascular: Negative.  Negative for chest pain, palpitations and leg swelling.  Gastrointestinal: Negative.  Negative for abdominal pain, blood in stool, diarrhea, nausea and vomiting.  Genitourinary: Positive for flank pain. Negative for dysuria.  Musculoskeletal: Negative for back pain.  Skin: Negative.   Neurological: Negative for dizziness, tremors, speech change, focal weakness, seizures and headaches.  Endo/Heme/Allergies: Negative.  Does not bruise/bleed easily.  Psychiatric/Behavioral: Negative.  Negative for depression, hallucinations and suicidal ideas.    Tolerating Diet: yes      DRUG ALLERGIES:   Allergies  Allergen Reactions  . Amoxicillin Nausea And Vomiting  . Azithromycin Nausea And Vomiting  . Ciprofloxacin Nausea And Vomiting    "Cannot take by mouth, OK by IV"  . Hydrocodone Nausea And Vomiting  . Percocet [Oxycodone-Acetaminophen] Nausea And Vomiting  . Sulfa Antibiotics Nausea And Vomiting    VITALS:  Blood pressure (!) 99/45, pulse 82, temperature 98.2 F (36.8 C), temperature source Oral, resp. rate 17, height  (1.6 m), weight 108.9 kg (240 lb), SpO2 95 %.  PHYSICAL EXAMINATION:   Physical Exam  Constitutional: She is oriented to person, place, and time and well-developed, well-nourished, and in no distress. No distress.  HENT:  Head: Normocephalic.  Eyes: No scleral icterus.  Neck: Normal range of motion.  Neck supple. No JVD present. No tracheal deviation present.  Cardiovascular: Normal rate, regular rhythm and normal heart sounds.  Exam reveals no gallop and no friction rub.   No murmur heard. Pulmonary/Chest: Effort normal and breath sounds normal. No respiratory distress. She has no wheezes. She has no rales. She exhibits no tenderness.  Abdominal: Soft. Bowel sounds are normal. She exhibits no distension and no mass. There is no tenderness. There is no rebound and no guarding.  Musculoskeletal: Normal range of motion. She exhibits no edema.  Neurological: She is alert and oriented to person, place, and time.  Skin: Skin is warm. No rash noted. No erythema.  Psychiatric: Affect and judgment normal.      LABORATORY PANEL:   CBC  Recent Labs Lab 08/12/16 0914  WBC 7.4  HGB 11.1*  HCT 34.2*  PLT 295   ------------------------------------------------------------------------------------------------------------------  Chemistries   Recent Labs Lab 08/10/16 1258  08/12/16 0914  NA 139  < > 140  K 4.1  < > 3.6  CL 105  < > 112*  CO2 27  < > 24  GLUCOSE 129*  < > 98  BUN 16  < > 15  CREATININE 1.48*  < > 1.24*  CALCIUM 9.3  < > 8.4*  AST 23  --   --   ALT 13*  --   --   ALKPHOS 63  --   --   BILITOT 1.1  --   --   < > = values in this interval not displayed. ------------------------------------------------------------------------------------------------------------------  Cardiac Enzymes No results for input(s): TROPONINI in the last 168 hours. ------------------------------------------------------------------------------------------------------------------  RADIOLOGY:  US Renal  Result  Date: 08/12/2016 CLINICAL DATA:  Hydronephrosis EXAM: RENAL / URINARY TRACT ULTRASOUND COMPLETE COMPARISON:  08/10/2016 FINDINGS: Right Kidney: Length: 9 cm. Echogenicity within normal limits. No mass or hydronephrosis visualized. 4 mm lower pole renal calculus. Left Kidney: Length:  11.5 cm. Echogenicity within normal limits. No mass. 4 mm lower pole renal calculus. Mild hydronephrosis. Bladder: Appears normal for degree of bladder distention. Prevoid bladder volume 259 mL. Postvoid bladder volume 255 mL. IMPRESSION: 1. 4 mm left lower pole renal calculus.  Mild left hydronephrosis. 2. Nonobstructing 4 mm right lower pole renal calculus. Electronically Signed   By: Elige Ko   On: 08/12/2016 08:20     ASSESSMENT AND PLAN:   56 year old female status post elective left ureteroscopy for a 4 mm LLP stone 3/26 who presents with ureteral pain.  1. Intractable pain from ureteral stent that was removed on March 29 and history of interstitial cystitis: Patient was admitted for pain control. Her symptoms improved. She had a repeat ultrasound which shows very minimal hydronephrosis. I spoke with urology who does not have plans for replacement of stent due to findings on renal ultrasound. She will need a follow-up with her outpatient urologist in 4-6 weeks.    2. UTI: She will continue on Keflex which was prescribed to her but she has not filled.   3. Fibromyalgia with chronic pain syndrome: Continue tramadol And other pain medications      Management plans discussed with the patient and she is in agreement.  CODE STATUS: full  TOTAL TIME TAKING CARE OF THIS PATIENT: 27 minutes.     POSSIBLE D/C today, DEPENDING ON CLINICAL CONDITION.   Gizzelle Lacomb M.D on 08/13/2016 at 8:07 AM  Between 7am to 6pm - Pager - [phone] After 6pm go to [email or web address] - password [email or web address]  [facility]  Office  [phone]  CC: Primary care physician; Rayetta Humphrey, MD  Note: This dictation was prepared with Dragon dictation along with smaller phrase technology. Any transcriptional errors that result from this process are unintentional.

## 2016-08-13 NOTE — Progress Notes (Signed)
Pt to be discharged per MD order. IV removed. Instructions reviewed with pt and family. All questions answered. Scripts given to pt. Will discharge in wheelchair. 

## 2016-08-13 NOTE — Care Management Obs Status (Signed)
MEDICARE OBSERVATION STATUS NOTIFICATION   Patient Details  Name: Joanna James MRN: [id number] Date of Birth: 01/25/1961   Medicare Observation Status Notification Given:  Yes (MOON letter given)    Jolee Ewing, RN 08/13/2016, 8:39 AM

## 2016-08-13 NOTE — Care Management Note (Signed)
Case Management Note  Patient Details  Name: Joanna James MRN: [id number] Date of Birth: [birth year]  Subjective/Objective:    [name] is scheduled for discharge home today. Josh RN reports that if [name] has not urinated by 5pm today that Dr Juliene Pina gave orders for an In and Out catheter and to then discharge home.                 Action/Plan:   Expected Discharge Date:  08/13/16               Expected Discharge Plan:     In-House Referral:     Discharge planning Services     Post Acute Care Choice:    Choice offered to:     DME Arranged:    DME Agency:     HH Arranged:    HH Agency:     Status of Service:     If discussed at [employer], dates discussed:    Additional Comments:  [name] A, RN 08/13/2016, 4:37 PM

## 2016-08-13 NOTE — Anesthesia Procedure Notes (Signed)
Procedure Name: LMA Insertion Performed by: Isair Inabinet Pre-anesthesia Checklist: Patient identified, Patient being monitored, Timeout performed, Emergency Drugs available and Suction available Patient Re-evaluated:Patient Re-evaluated prior to inductionOxygen Delivery Method: Circle system utilized Preoxygenation: Pre-oxygenation with 100% oxygen Intubation Type: IV induction Ventilation: Mask ventilation without difficulty LMA: LMA inserted LMA Size: 3.5 Tube type: Oral Number of attempts: 1 Placement Confirmation: positive ETCO2 and breath sounds checked- equal and bilateral Tube secured with: Tape Dental Injury: Teeth and Oropharynx as per pre-operative assessment      

## 2016-08-13 NOTE — Transfer of Care (Signed)
Immediate Anesthesia Transfer of Care Note  Patient: Joanna James  Procedure(s) Performed: Procedure(s): CYSTOSCOPY WITH STENT PLACEMENT (Left)  Patient Location: PACU  Anesthesia Type:General  Level of Consciousness: awake, alert  and oriented  Airway & Oxygen Therapy: Patient Spontanous Breathing and Patient connected to face mask oxygen  Post-op Assessment: Report given to RN and Post -op Vital signs reviewed and stable  Post vital signs: Reviewed and stable  Last Vitals:  Vitals:   08/13/16 0449 08/13/16 1013  BP: (!) 99/45 106/71  Pulse: 82 79  Resp: 17 15  Temp: 36.8 C 36.7 C    Last Pain:  Vitals:   08/13/16 1013  TempSrc:   PainSc: Asleep      Patients Stated Pain Goal: 0 (08/11/16 2200)  Complications: No apparent anesthesia complications

## 2016-08-13 NOTE — Discharge Instructions (Addendum)
Ureteral Stent Implantation, Care After Refer to this sheet in the next few weeks. These instructions provide you with information about caring for yourself after your procedure. Your health care provider may also give you more specific instructions. Your treatment has been planned according to current medical practices, but problems sometimes occur. Call your health care provider if you have any problems or questions after your procedure. What can I expect after the procedure? After the procedure, it is common to have:  Nausea.  Mild pain when you urinate. You may feel this pain in your lower back or lower abdomen. Pain should stop within a few minutes after you urinate. This may last for up to 1 week.  A small amount of blood in your urine for several days. Follow these instructions at home:   Medicines   Take over-the-counter and prescription medicines only as told by your health care provider.  If you were prescribed an antibiotic medicine, take it as told by your health care provider. Do not stop taking the antibiotic even if you start to feel better.  Do not drive for 24 hours if you received a sedative.  Do not drive or operate heavy machinery while taking prescription pain medicines. Activity   Return to your normal activities as told by your health care provider. Ask your health care provider what activities are safe for you.  Do not lift anything that is heavier than 10 lb (4.5 kg). Follow this limit for 1 week after your procedure, or for as long as told by your health care provider. General instructions   Watch for any blood in your urine. Call your health care provider if the amount of blood in your urine increases.  If you have a catheter:  Follow instructions from your health care provider about taking care of your catheter and collection bag.  Do not take baths, swim, or use a hot tub until your health care provider approves.  Drink enough fluid to keep your urine  clear or pale yellow.  Keep all follow-up visits as told by your health care provider. This is important. Contact a health care provider if:  You have pain that gets worse or does not get better with medicine, especially pain when you urinate.  You have difficulty urinating.  You feel nauseous or you vomit repeatedly during a period of more than 2 days after the procedure. Get help right away if:  Your urine is dark red or has blood clots in it.  You are leaking urine (have incontinence).  The end of the stent comes out of your urethra.  You cannot urinate.  You have sudden, sharp, or severe pain in your abdomen or lower back.  You have a fever. This information is not intended to replace advice given to you by your health care provider. Make sure you discuss any questions you have with your health care provider. Document Released: 01/01/2013 Document Revised: 10/07/2015 Document Reviewed: 11/13/2014 Elsevier Interactive Patient Education  2017 [employer] Hydronephrosis Hydronephrosis is the enlargement of a kidney due to a blockage that stops urine from flowing out of the body. What are the causes?  Causes of this condition include:   Recent urinary tract surgery for stone (ureteroscopy). Important to follow-up with Dr. Logan Bores to ensure the hydronephrosis resolves.   What are the signs or symptoms? Symptoms of this condition include:  Pain or discomfort in your side (flank).  Swelling of the abdomen.  Pain in the abdomen.  Nausea and  vomiting.  Fever.  Pain while passing urine.  Feeling of urgency to urinate.  Frequent urination.  Infection of the urinary tract. In some cases, there are no symptoms. How is this diagnosed? This condition may be diagnosed with:  A medical history.  A physical exam.  Blood and urine tests to check kidney function.  Imaging tests, such as an X-ray, ultrasound, CT scan, or MRI.  A test in which a rigid or flexible  telescope (cystoscope) is used to view the site of the blockage. How is this treated? Treatment for this condition depends on where the blockage is located, how long it has been there, and what caused it. The goal of treatment is to remove the blockage. Treatment options include:  A procedure to put in a soft tube to help drain urine.  Antibiotic medicines to treat or prevent infection.  Shock-wave therapy (lithotripsy) to help eliminate kidney stones. Follow these instructions at home:  Get lots of rest.  Drink enough fluid to keep your urine clear or pale yellow.  If you have a drain in, follow your health care provider's instructions about how to care for it.  Take medicines only as directed by your health care provider.  If you were prescribed an antibiotic medicine, finish all of it even if you start to feel better.  Keep all follow-up visits as directed by your health care provider. This is important. Contact a health care provider if:  You continue to have symptoms after treatment.  You develop new symptoms.  You have a problem with a drainage device.  Your urine becomes cloudy or bloody.  You have a fever. Get help right away if:  You have severe flank or abdominal pain.  You develop vomiting and are unable to keep fluids down. This information is not intended to replace advice given to you by your health care provider. Make sure you discuss any questions you have with your health care provider. Document Released: 02/26/2007 Document Revised: 10/07/2015 Document Reviewed: 04/27/2014 Elsevier Interactive Patient Education  2017 [employer].

## 2016-08-13 NOTE — Interval H&P Note (Signed)
History and Physical Interval Note:  She continues to have left flank pain this morning so I will proceed with cystoscopy and ureteral stent insertion.  08/13/2016 9:27 AM  Joanna James  has presented today for surgery, with the diagnosis of Left Hydronephrosis  The various methods of treatment have been discussed with the patient and family. After consideration of risks, benefits and other options for treatment, the patient has consented to  Procedure(s): CYSTOSCOPY WITH STENT PLACEMENT (Left) as a surgical intervention .  The patient's history has been reviewed, patient examined, no change in status, stable for surgery.  I have reviewed the patient's chart and labs.  Questions were answered to the patient's satisfaction.     Seleni Meller J

## 2016-08-13 NOTE — Brief Op Note (Signed)
08/11/2016 - 08/13/2016  10:02 AM  PATIENT:  Joanna James  55 y.o. female  PRE-OPERATIVE DIAGNOSIS:  Left Hydronephrosis  POST-OPERATIVE DIAGNOSIS:  same  PROCEDURE:  Procedure(s): CYSTOSCOPY WITH STENT PLACEMENT (Left) Retrograde pyelogram with interpretation.   SURGEON:  Surgeon(s) and Role:    * Bjorn Pippin, MD - Primary  PHYSICIAN ASSISTANT:   ASSISTANTS: none   ANESTHESIA:   general  EBL:  Total I/O In: 50 [IV Piggyback:50] Out: -   BLOOD ADMINISTERED:none  DRAINS: 6 x 24 left JJ stent   LOCAL MEDICATIONS USED:  NONE  SPECIMEN:  No Specimen  DISPOSITION OF SPECIMEN:  N/A  COUNTS:  YES  TOURNIQUET:  * No tourniquets in log *  DICTATION: .Other Dictation: Dictation Number [id number]  PLAN OF CARE: Discharge to home after PACU  PATIENT DISPOSITION:  PACU - hemodynamically stable.   Delay start of Pharmacological VTE agent (>24hrs) due to surgical blood loss or risk of bleeding: not applicable

## 2016-08-13 NOTE — Anesthesia Preprocedure Evaluation (Signed)
Anesthesia Evaluation  Patient identified by MRN, date of birth, ID band Patient awake    Reviewed: Allergy & Precautions, H&P , NPO status , Patient's Chart, lab work & pertinent test results, reviewed documented beta blocker date and time   Airway Mallampati: II  TM Distance: >3 FB Neck ROM: full    Dental  (+) Teeth Intact   Pulmonary neg pulmonary ROS,    Pulmonary exam normal        Cardiovascular Exercise Tolerance: Good hypertension, negative cardio ROS Normal cardiovascular exam Rate:Normal     Neuro/Psych negative neurological ROS  negative psych ROS   GI/Hepatic negative GI ROS, Neg liver ROS,   Endo/Other  negative endocrine ROS  Renal/GU negative Renal ROS  negative genitourinary   Musculoskeletal   Abdominal   Peds  Hematology negative hematology ROS (+)   Anesthesia Other Findings   Reproductive/Obstetrics negative OB ROS                             Anesthesia Physical Anesthesia Plan  ASA: II  Anesthesia Plan: General LMA   Post-op Pain Management:    Induction:   Airway Management Planned:   Additional Equipment:   Intra-op Plan:   Post-operative Plan:   Informed Consent: I have reviewed the patients History and Physical, chart, labs and discussed the procedure including the risks, benefits and alternatives for the proposed anesthesia with the patient or authorized representative who has indicated his/her understanding and acceptance.     Plan Discussed with: CRNA  Anesthesia Plan Comments:         Anesthesia Quick Evaluation  

## 2016-08-13 NOTE — Op Note (Signed)
NAMEJEILANI, GRUPE NO.:  [id number]  MEDICAL RECORD NO.:  [id number]  LOCATION:  217A                         FACILITY:  [facility]  PHYSICIAN:  Excell Seltzer. Annabell Howells, M.D.    DATE OF BIRTH:  [birth year]  DATE OF PROCEDURE:  08/13/2016 DATE OF DISCHARGE:                              OPERATIVE REPORT   PROCEDURE PERFORMED: 1. Cystoscopy with left retrograde pyelogram and interpretation. 2. Insertion of left double-J stent.  SURGEON:  Excell Seltzer. Annabell Howells, M.D.  PREOPERATIVE DIAGNOSIS:  Left hydronephrosis with pain post ureteroscopy.  POSTOPERATIVE DIAGNOSIS:  Left hydronephrosis with pain post ureteroscopy.  SURGEON:  Excell Seltzer. Annabell Howells, MD.  ANESTHESIA:  General.  SPECIMEN:  None.  DRAINS:  A 6-French x 24 cm left double-J stent.  COMPLICATIONS:  None.  BLOOD LOSS:  None.  INDICATIONS:  Ms. Skillin is a 56 year old African-American female, who had ureteroscopy on the left for stone on March 26.  Her stent was removed on March 29.  She was admitted to the hospital on March 30 with left flank pain.  It was found on CT to have hydronephrosis down to the distal ureter.  She had persistent pain associated with that and persistent hydronephrosis on an ultrasound on March 31.  She has elected to undergo replacement of her stent.  She continues to have pain today.  FINDINGS AND PROCEDURE:  She was taken to the operating room where she was given antibiotic.  A general anesthetic was induced.  She was placed in a lithotomy position.  Her perineum and genitalia were prepped with Betadine solution and she was draped in usual sterile fashion.  Cystoscopy was performed using the 23-French scope and 30-degree lens. Examination revealed a normal urethra.  The ureteral orifices were unremarkable.  The bladder wall was smooth and pale without tumor, stones, or inflammation.  The left ureteral orifice was cannulated with a 5-French open-end catheter and contrast was  instilled.  There was some diffuse narrowing of the distal ureter with mild proximal dilation.  A guidewire was passed through the open-end catheter to the kidney and the open-end catheter was removed.  There was brisk efflux of Pyridium stained urine along the wire.  A 6-French 24 cm Contour double-J stent was then inserted under fluoroscopic guidance.  The wire was removed leaving good coil in the kidney, a good coil in the bladder.  The bladder was drained.  The patient was taken down from lithotomy position.  Her anesthetic was reversed.  She was moved to recovery room in stable condition.  There were no complications.     Excell Seltzer. Annabell Howells, M.D.     JJW/MEDQ  D:  08/13/2016  T:  08/13/2016  Job:  [id number]  cc:   Jamison Neighbor, M.D.

## 2016-08-13 NOTE — Anesthesia Post-op Follow-up Note (Cosign Needed)
Anesthesia QCDR form completed.        

## 2016-08-13 NOTE — Progress Notes (Signed)
[employer] [facility] - [place] at [facility]   PATIENT NAME: Joanna James    MR#:  [id number]  DATE OF BIRTH:  Aug 07, 1960  SUBJECTIVE:  Patient stayed overnight complaining of pain.  Plan for ureteral stent today  REVIEW OF SYSTEMS:    Review of Systems  Constitutional: Negative.  Negative for chills, fever and malaise/fatigue.  HENT: Negative.  Negative for ear discharge, ear pain, hearing loss, nosebleeds and sore throat.   Eyes: Negative.  Negative for blurred vision and pain.  Respiratory: Negative.  Negative for cough, hemoptysis, shortness of breath and wheezing.   Cardiovascular: Negative.  Negative for chest pain, palpitations and leg swelling.  Gastrointestinal: Negative.  Negative for abdominal pain, blood in stool, diarrhea, nausea and vomiting.  Genitourinary: Positive for flank pain. Negative for dysuria.  Musculoskeletal: Negative for back pain.  Skin: Negative.   Neurological: Negative for dizziness, tremors, speech change, focal weakness, seizures and headaches.  Endo/Heme/Allergies: Negative.  Does not bruise/bleed easily.  Psychiatric/Behavioral: Negative.  Negative for depression, hallucinations and suicidal ideas.    Tolerating Diet: yes      DRUG ALLERGIES:   Allergies  Allergen Reactions  . Amoxicillin Nausea And Vomiting  . Azithromycin Nausea And Vomiting  . Ciprofloxacin Nausea And Vomiting    "Cannot take by mouth, OK by IV"  . Hydrocodone Nausea And Vomiting  . Percocet [Oxycodone-Acetaminophen] Nausea And Vomiting  . Sulfa Antibiotics Nausea And Vomiting    VITALS:  Blood pressure (!) 99/45, pulse 82, temperature 98.2 F (36.8 C), temperature source Oral, resp. rate 17, height  (1.6 m), weight 108.9 kg (240 lb), SpO2 95 %.  PHYSICAL EXAMINATION:   Physical Exam  Constitutional: She is oriented to person, place, and time and well-developed, well-nourished, and in no distress. No distress.  HENT:  Head: Normocephalic.   Eyes: No scleral icterus.  Neck: Normal range of motion. Neck supple. No JVD present. No tracheal deviation present.  Cardiovascular: Normal rate, regular rhythm and normal heart sounds.  Exam reveals no gallop and no friction rub.   No murmur heard. Pulmonary/Chest: Effort normal and breath sounds normal. No respiratory distress. She has no wheezes. She has no rales. She exhibits no tenderness.  Abdominal: Soft. Bowel sounds are normal. She exhibits no distension and no mass. There is no tenderness. There is no rebound and no guarding.  Musculoskeletal: Normal range of motion. She exhibits no edema.  Neurological: She is alert and oriented to person, place, and time.  Skin: Skin is warm. No rash noted. No erythema.  Psychiatric: Affect and judgment normal.      LABORATORY PANEL:   CBC  Recent Labs Lab 08/12/16 0914  WBC 7.4  HGB 11.1*  HCT 34.2*  PLT 295   ------------------------------------------------------------------------------------------------------------------  Chemistries   Recent Labs Lab 08/10/16 1258  08/12/16 0914  NA 139  < > 140  K 4.1  < > 3.6  CL 105  < > 112*  CO2 27  < > 24  GLUCOSE 129*  < > 98  BUN 16  < > 15  CREATININE 1.48*  < > 1.24*  CALCIUM 9.3  < > 8.4*  AST 23  --   --   ALT 13*  --   --   ALKPHOS 63  --   --   BILITOT 1.1  --   --   < > = values in this interval not displayed. ------------------------------------------------------------------------------------------------------------------  Cardiac Enzymes No results for input(s): TROPONINI in the last  168 hours. ------------------------------------------------------------------------------------------------------------------  RADIOLOGY:  US Renal  Result Date: 08/12/2016 CLINICAL DATA:  Hydronephrosis EXAM: RENAL / URINARY TRACT ULTRASOUND COMPLETE COMPARISON:  08/10/2016 FINDINGS: Right Kidney: Length: 9 cm. Echogenicity within normal limits. No mass or hydronephrosis  visualized. 4 mm lower pole renal calculus. Left Kidney: Length: 11.5 cm. Echogenicity within normal limits. No mass. 4 mm lower pole renal calculus. Mild hydronephrosis. Bladder: Appears normal for degree of bladder distention. Prevoid bladder volume 259 mL. Postvoid bladder volume 255 mL. IMPRESSION: 1. 4 mm left lower pole renal calculus.  Mild left hydronephrosis. 2. Nonobstructing 4 mm right lower pole renal calculus. Electronically Signed   By: Elige Ko   On: 08/12/2016 08:20     ASSESSMENT AND PLAN:   57 year old female status post elective left ureteroscopy for a 4 mm LLP stone 3/26 who presents with ureteral pain.  1. Intractable pain from ureteral stent that was removed on March 29 and history of interstitial cystitis: Patient was admitted for pain control. Her symptoms Initially improved. She had a repeat ultrasound which shows very minimal hydronephrosis. Due to ongoing pain, plan for ureteral stent today. After stent patient may be discharged home   2. UTI: Her urine culture actually showed no growth. She will not require antibiotics at discharge.  3. Fibromyalgia with chronic pain syndrome: Continue tramadol And other pain medications Patient has had several ER visits and hospitalizations in the past due to "pain".      Management plans discussed with the patient and she is in agreement.  CODE STATUS: full  TOTAL TIME TAKING CARE OF THIS PATIENT: 27 minutes.     POSSIBLE D/C today, DEPENDING ON CLINICAL CONDITION.   Syrina Wake M.D on 08/13/2016 at 8:43 AM  Between 7am to 6pm - Pager - [phone] After 6pm go to [email or web address] - password [email or web address]  [facility]  Office  [phone]  CC: Primary care physician; Rayetta Humphrey, MD  Note: This dictation was prepared with Dragon dictation along with smaller phrase technology. Any transcriptional errors that result from this process are unintentional.

## 2016-08-14 ENCOUNTER — Encounter: Payer: Self-pay | Admitting: Urology

## 2016-08-17 ENCOUNTER — Emergency Department: Payer: Medicare Other

## 2016-08-17 ENCOUNTER — Emergency Department
Admission: EM | Admit: 2016-08-17 | Discharge: 2016-08-17 | Disposition: A | Discharge status: Home / Self Care | Payer: Medicare Other | Attending: Emergency Medicine | Admitting: Emergency Medicine

## 2016-08-17 ENCOUNTER — Encounter: Payer: Self-pay | Admitting: Emergency Medicine

## 2016-08-17 DIAGNOSIS — I10 Essential (primary) hypertension: Secondary | ICD-10-CM | POA: Diagnosis not present

## 2016-08-17 DIAGNOSIS — N23 Unspecified renal colic: Secondary | ICD-10-CM

## 2016-08-17 DIAGNOSIS — Z79899 Other long term (current) drug therapy: Secondary | ICD-10-CM | POA: Diagnosis not present

## 2016-08-17 DIAGNOSIS — R109 Unspecified abdominal pain: Secondary | ICD-10-CM | POA: Diagnosis present

## 2016-08-17 LAB — BASIC METABOLIC PANEL
ANION GAP: 7 (ref 5–15)
BUN: 19 mg/dL (ref 6–20)
CALCIUM: 9 mg/dL (ref 8.9–10.3)
CHLORIDE: 106 mmol/L (ref 101–111)
CO2: 26 mmol/L (ref 22–32)
Creatinine, Ser: 1.2 mg/dL — ABNORMAL HIGH (ref 0.44–1.00)
GFR calc non Af Amer: 50 mL/min — ABNORMAL LOW (ref >60)
GFR, EST AFRICAN AMERICAN: 57 mL/min — AB (ref >60)
GLUCOSE: 109 mg/dL — AB (ref 65–99)
Potassium: 4 mmol/L (ref 3.5–5.1)
Sodium: 139 mmol/L (ref 135–145)

## 2016-08-17 MED ORDER — KETOROLAC TROMETHAMINE 30 MG/ML IJ SOLN
15.0000 mg | Freq: Once | INTRAMUSCULAR | Status: AC
  Administered 2016-08-17: 15 mg via INTRAVENOUS
  Filled 2016-08-17: qty 1

## 2016-08-17 MED ORDER — OXYBUTYNIN CHLORIDE 5 MG PO TABS: 5.0000 mg | ORAL_TABLET | Freq: Three times a day (TID) | ORAL | 0 refills | Status: DC

## 2016-08-17 MED ORDER — ONDANSETRON HCL 4 MG/2ML IJ SOLN
4.0000 mg | INTRAMUSCULAR | Status: AC
  Administered 2016-08-17: 4 mg via INTRAVENOUS

## 2016-08-17 MED ORDER — ONDANSETRON 4 MG PO TBDP: ORAL_TABLET | ORAL | 0 refills | Status: DC

## 2016-08-17 MED ORDER — OXYBUTYNIN CHLORIDE 5 MG PO TABS: 5.0000 mg | ORAL_TABLET | Freq: Three times a day (TID) | ORAL | 0 refills | Status: DC | PRN

## 2016-08-17 MED ORDER — SODIUM CHLORIDE 0.9 % IV SOLN
INTRAVENOUS | Status: DC
  Filled 2016-08-17 (×5): qty 250

## 2016-08-17 MED ORDER — SODIUM CHLORIDE 0.9 % IV SOLN
Freq: Once | INTRAVENOUS | Status: AC
  Administered 2016-08-17: 05:00:00 via INTRAVENOUS
  Filled 2016-08-17: qty 250

## 2016-08-17 MED ORDER — ONDANSETRON HCL 4 MG/2ML IJ SOLN
INTRAMUSCULAR | Status: AC
Start: 2016-08-17 — End: 2016-08-17
  Administered 2016-08-17: 4 mg via INTRAVENOUS
  Filled 2016-08-17: qty 2

## 2016-08-17 MED ORDER — LIDOCAINE HCL (CARDIAC) 20 MG/ML IV SOLN: 1.5000 mg/kg | Freq: Once | INTRAVENOUS | Status: DC

## 2016-08-17 MED ORDER — OXYBUTYNIN CHLORIDE 5 MG PO TABS
5.0000 mg | ORAL_TABLET | ORAL | Status: AC
  Administered 2016-08-17: 5 mg via ORAL
  Filled 2016-08-17: qty 1

## 2016-08-17 NOTE — ED Notes (Signed)
Pt discharged home after verbalizing understanding of discharge instructions; nad noted. 

## 2016-08-17 NOTE — ED Provider Notes (Addendum)
[facility] Emergency Department Provider Note  ____________________________________________   First MD Initiated Contact with Patient 08/17/16 [phone]     (approximate)  I have reviewed the triage vital signs and the nursing notes.   HISTORY  Chief Complaint Post-op Problem    HPI Joanna James is a 56 y.o. female with an extensive history of chronic pain syndromes, upper myalgia, and recurrent severe left flank pain in the setting of kidney stones and multiple ureteral stents.  She presents by private vehicle tonight for evaluation of acute onset severe left flank pain similar to prior.She has had a somewhat complicated recent history with a left-sided stent placement at [facility] about a week ago.  She removed that stent as directed by her urologist but then started having acute pain again.  She required multiple doses of IV narcotics and benzodiazepines in the emergency department when she came back about 5 days ago and was admitted for pain control.  At that time she was taken to the operating room and urology placed another stent in the left ureter.  She states that her pain improved at that time but it started back up again last night.  She describes it as sharp and severe and nothing makes it better nor worse.  Give a history of chronic pain and fibromyalgia she has been prescribed tramadol which she states is not helping.  She has had nausea but no vomiting.  She denies fever/chills, chest pain, shortness of breath, abdominal pain.   Past Medical History:  Diagnosis Date  . Chronic pain syndrome   . Fibromyalgia   . Gout   . Hypertension   . IC (interstitial cystitis)   . Spinal stenosis     Patient Active Problem List   Diagnosis Date Noted  . Pain 08/11/2016  . Abdominal pain 06/21/2016    Past Surgical History:  Procedure Laterality Date  . ABDOMINAL HYSTERECTOMY    . APPENDECTOMY    . BLADDER SUSPENSION    .  CYSTOSCOPY WITH STENT PLACEMENT Left 08/13/2016   Procedure: CYSTOSCOPY WITH STENT PLACEMENT;  Surgeon: Bjorn Pippin, MD;  Location: [facility] ORS;  Service: Urology;  Laterality: Left;  . ECTOPIC PREGNANCY SURGERY    . meniscus tear    . ROTATOR CUFF REPAIR    . THYROIDECTOMY    . TUBAL LIGATION      Prior to Admission medications   Medication Sig Start Date End Date Taking? Authorizing Provider  amitriptyline (ELAVIL) 50 MG tablet Take 50 mg by mouth at bedtime. 02/21/16 02/20/17  Historical Provider, MD  Apremilast 10 & 20 & 30 MG TBPK Take 10 mg by mouth every morning.    Historical Provider, MD  aspirin EC 81 MG tablet Take 81 mg by mouth daily. 02/23/14   Historical Provider, MD  cyclobenzaprine (FLEXERIL) 10 MG tablet Take 1 tablet (10 mg total) by mouth every 8 (eight) hours as needed for muscle spasms. 04/26/15   Evangeline Dakin, PA-C  diclofenac (FLECTOR) 1.3 % PTCH Place 1 patch onto the skin 2 (two) times daily.    Historical Provider, MD  diclofenac sodium (VOLTAREN) 1 % GEL Apply 2 g topically 4 (four) times daily as needed.    Historical Provider, MD  fluocinolone (VANOS) 0.01 % cream Apply 1 application topically 2 (two) times daily.    Historical Provider, MD  hydrOXYzine (ATARAX/VISTARIL) 25 MG tablet Take 25 mg by mouth 2 (two) times daily.    Historical Provider, MD  Hyoscyamine Sulfate SL 0.125 MG SUBL Place 0.125 mg under the tongue every 6 (six) hours as needed.    Historical Provider, MD  levothyroxine (SYNTHROID, LEVOTHROID) 200 MCG tablet Take 225 mcg by mouth daily. 03/20/16   Historical Provider, MD  levothyroxine (SYNTHROID, LEVOTHROID) 25 MCG tablet Take 25 mcg by mouth daily before breakfast.    Historical Provider, MD  lisinopril (PRINIVIL,ZESTRIL) 10 MG tablet Take 10 mg by mouth daily.    Historical Provider, MD  mirabegron ER (MYRBETRIQ) 50 MG TB24 tablet Take 50 mg by mouth daily.    Historical Provider, MD  ondansetron (ZOFRAN ODT) 4 MG disintegrating tablet Allow 1-2  tablets to dissolve in your mouth every 8 hours as needed for nausea/vomiting 08/17/16   Loleta Rose, MD  opium-belladonna (B&O SUPPRETTES) 16.2-60 MG suppository Place 1 suppository rectally every 8 (eight) hours as needed for bladder spasms. 08/13/16   Adrian Saran, MD  oxybutynin (DITROPAN) 5 MG tablet Take 1 tablet (5 mg total) by mouth 3 (three) times daily as needed for bladder spasms. 08/17/16   Loleta Rose, MD  pentosan polysulfate (ELMIRON) 100 MG capsule Take 100 mg by mouth 3 (three) times daily.    Historical Provider, MD  pentoxifylline (TRENTAL) 400 MG CR tablet Take 400 mg by mouth 2 (two) times daily.    Historical Provider, MD  phenazopyridine (PYRIDIUM) 100 MG tablet Take 1 tablet (100 mg total) by mouth 3 (three) times daily with meals. 08/12/16   Adrian Saran, MD  pregabalin (LYRICA) 100 MG capsule Take 100 mg by mouth 3 (three) times daily. 04/11/16   Historical Provider, MD  promethazine (PHENERGAN) 12.5 MG tablet Take 1 tablet (12.5 mg total) by mouth every 6 (six) hours as needed for nausea or vomiting. 05/30/15   Minna Antis, MD  ranitidine (ZANTAC) 300 MG tablet Take 300 mg by mouth at bedtime. 03/09/16 03/09/17  Historical Provider, MD  tamsulosin (FLOMAX) 0.4 MG CAPS capsule Take 0.4 mg by mouth daily.    Historical Provider, MD  tiZANidine (ZANAFLEX) 2 MG tablet Take 2 mg by mouth every 8 (eight) hours as needed for muscle spasms. 04/11/16   Historical Provider, MD  traMADol (ULTRAM) 50 MG tablet Take 1 tablet (50 mg total) by mouth 3 (three) times daily as needed for moderate pain or severe pain. 06/22/16   Enedina Finner, MD  trimethoprim (TRIMPEX) 100 MG tablet Take 100 mg by mouth 2 (two) times daily.    Historical Provider, MD    Allergies Amoxicillin; Azithromycin; Ciprofloxacin; Hydrocodone; Percocet [oxycodone-acetaminophen]; and Sulfa antibiotics  History reviewed. No pertinent family history.  Social History Social History  Substance Use Topics  . Smoking status:  Never Smoker  . Smokeless tobacco: Never Used  . Alcohol use Yes    Review of Systems Constitutional: No fever/chills Eyes: No visual changes. ENT: No sore throat. Cardiovascular: Denies chest pain. Respiratory: Denies shortness of breath. Gastrointestinal: Severe left flank pain similar to prior.  Nausea, no vomiting.  No diarrhea.  No constipation. Genitourinary: Negative for dysuria. Musculoskeletal: Negative for back pain. Skin: Negative for rash. Neurological: Negative for headaches, focal weakness or numbness.  10-point ROS otherwise negative.  ____________________________________________   PHYSICAL EXAM:  VITAL SIGNS: ED Triage Vitals  Enc Vitals Group     BP 08/17/16 0418 116/63     Pulse Rate 08/17/16 0418 92     Resp 08/17/16 0418 20     Temp 08/17/16 0418 98.1 F (36.7 C)     Temp Source  08/17/16 0418 Oral     SpO2 08/17/16 0418 98 %     Weight 08/17/16 0418 240 lb (108.9 kg)     Height 08/17/16 0418  (1.6 m)     Head Circumference --      Peak Flow --      Pain Score 08/17/16 0415 9     Pain Loc --      Pain Edu? --      Excl. in GC? --     Constitutional: Alert and oriented. Well appearing and in no acute distress. Eyes: Conjunctivae are normal. PERRL. EOMI. Head: Atraumatic. Nose: No congestion/rhinnorhea. Mouth/Throat: Mucous membranes are moist. Neck: No stridor.  No meningeal signs.   Cardiovascular: Normal rate, regular rhythm. Good peripheral circulation. Grossly normal heart sounds. Respiratory: Normal respiratory effort.  No retractions. Lungs CTAB. Gastrointestinal: Soft and nontender. No distention.  Left CVA tenderness Musculoskeletal: No lower extremity tenderness nor edema. No gross deformities of extremities. Neurologic:  Normal speech and language. No gross focal neurologic deficits are appreciated.  Skin:  Skin is warm, dry and intact. No rash noted. Psychiatric: Mood and affect are normal. Speech and behavior are  normal.  ____________________________________________   LABS (all labs ordered are listed, but only abnormal results are displayed)  Labs Reviewed  BASIC METABOLIC PANEL - Abnormal; Notable for the following:       Result Value   Glucose, Bld 109 (*)    Creatinine, Ser 1.20 (*)    GFR calc non Af Amer 50 (*)    GFR calc Af Amer 57 (*)    All other components within normal limits   ____________________________________________  EKG  None - EKG not ordered by ED physician ____________________________________________  RADIOLOGY   US Renal  Result Date: 08/17/2016 CLINICAL DATA:  Acute left flank pain tonight EXAM: RENAL / URINARY TRACT ULTRASOUND COMPLETE COMPARISON:  08/12/2016, 08/10/2016 FINDINGS: Right Kidney: Length: 8.6 cm. Normal parenchymal thickness and echotexture. No hydronephrosis. 5 mm midpole collecting system calculus. Left Kidney: Length: 10.2 cm. Normal parenchymal thickness and echotexture. Mild hydronephrosis. Lower pole 6 mm calculus. Bladder: Distal end of the left ureteral stent is visible in the urinary bladder lumen. Documented right ureteral jet. Otherwise unremarkable. IMPRESSION: Mild left hydronephrosis. Bilateral nephrolithiasis. Left ureteral stent was not well seen, but the portion within the urinary bladder was visible. Electronically Signed   By: Ellery Plunk M.D.   On: 08/17/2016 06:08    ____________________________________________   PROCEDURES  Critical Care performed: Yes, see critical care procedure note(s)   Procedure(s) performed:   .Critical Care Performed by: Loleta Rose Authorized by: Loleta Rose   Critical care provider statement:    Critical care time (minutes):  45   Critical care time was exclusive of:  Separately billable procedures and treating other patients   Critical care was time spent personally by me on the following activities:  Development of treatment plan with patient or surrogate, discussions with  consultants, evaluation of patient's response to treatment, examination of patient, obtaining history from patient or surrogate, ordering and performing treatments and interventions, ordering and review of laboratory studies, ordering and review of radiographic studies, pulse oximetry, re-evaluation of patient's condition and review of old charts       ____________________________________________   INITIAL IMPRESSION / ASSESSMENT AND PLAN / ED COURSE  Pertinent labs & imaging results that were available during my care of the patient were reviewed by me and considered in my medical decision making (see chart for  details).   Clinical Course as of Aug 18 727  Thu Aug 17, 2016  9629 Given the patient's symptoms and persistent issues of pain in the setting of a recent stent placement, I will treat with limited free cardiac lidocaine 1.5 mg/kg IV administered over 10 minutes along with Toradol 15 mg IV.  I think this will help a lot with analgesia and help avoid narcotics.  I will also order a renal ultrasound to evaluate and see whether or not her hydronephrosis has improved since the stent placement.  I will try to avoid additional CT scans if possible.  The patient understands and agrees with the plan.  [CF]  0448 I reviewed the patient's prescription history over the last 12 months in the multi-state controlled substances database(s) that includes [place], [place], [place], [place], [place], [place], [place], [place], [place], [place], [place], [place], [place], and [place].  Results were notable for 3 prescriptions of tramadol from Dr. Logan Bores during that time period and 1 prescription for Lyrica.  [CF]  0559 Toradol administered, lidocaine running.  U/S pending.  BMP reassuring, Cr improved from prior.  [CF]  [phone] Patient feels better after the lidocaine, pain not completely relieved.  Ultrasound generally unremarkable.  Will page Dr. Annabell Howells to discuss.    [CF]  0707 Discussed with Dr. Annabell Howells who was reassured by the U/S. Recommended non-narcotic medications and Ditropan.  Also recommended close follow up with her primary urologist at [facility].  Discussed with the patient who is feeling much better after the lidocaine IV infusion.  She understands and agrees with the plan.  [CF]  [id number]   I gave my usual and customary return precautions.     [CF]    Clinical Course User Index [CF] Loleta Rose, MD    ____________________________________________  FINAL CLINICAL IMPRESSION(S) / ED DIAGNOSES  Final diagnoses:  Renal colic on left side     MEDICATIONS GIVEN DURING THIS VISIT:  Medications  ondansetron (ZOFRAN) injection 4 mg (not administered)  ondansetron (ZOFRAN) 4 MG/2ML injection (not administered)  ketorolac (TORADOL) 30 MG/ML injection 15 mg (15 mg Intravenous Given 08/17/16 0525)  sodium chloride 0.9 % 250 mL with lidocaine (PF) (XYLOCAINE) 1 % 163 mg infusion ( Intravenous Stopped 08/17/16 0632)  oxybutynin (DITROPAN) tablet 5 mg (5 mg Oral Given 08/17/16 0651)     NEW OUTPATIENT MEDICATIONS STARTED DURING THIS VISIT:  New Prescriptions   ONDANSETRON (ZOFRAN ODT) 4 MG DISINTEGRATING TABLET    Allow 1-2 tablets to dissolve in your mouth every 8 hours as needed for nausea/vomiting   OXYBUTYNIN (DITROPAN) 5 MG TABLET    Take 1 tablet (5 mg total) by mouth 3 (three) times daily as needed for bladder spasms.    Modified Medications   No medications on file    Discontinued Medications   ONDANSETRON (ZOFRAN-ODT) 4 MG DISINTEGRATING TABLET    Take 4 mg by mouth every 8 (eight) hours as needed for nausea or vomiting.   OXYBUTYNIN (DITROPAN) 5 MG TABLET    Take 1 tablet (5 mg total) by mouth 3 (three) times daily.     Note:  This document was prepared using Dragon voice recognition software and may include unintentional dictation errors.    Loleta Rose, MD 08/17/16 1324    Loleta Rose, MD 08/17/16 [phone]

## 2016-08-17 NOTE — Discharge Instructions (Signed)
Your workup in the Emergency Department today was reassuring.  We did not find any specific abnormalities.  We recommend you drink plenty of fluids, take your regular medications and/or any new ones prescribed today, and follow up with the doctor(s) listed in these documents as recommended.  Return to the Emergency Department if you develop new or worsening symptoms that concern you.  

## 2016-08-17 NOTE — ED Triage Notes (Signed)
Pt presents to ED to with returning pain after she had a stent placed on sunday after she was dx with "urinary retention in the left kidney".

## 2016-08-17 NOTE — Anesthesia Postprocedure Evaluation (Signed)
Anesthesia Post Note  Patient: Riot Philippa Koehl  Procedure(s) Performed: Procedure(s) (LRB): CYSTOSCOPY WITH STENT PLACEMENT (Left)  Patient location during evaluation: PACU Anesthesia Type: General Level of consciousness: awake and alert Pain management: pain level controlled Vital Signs Assessment: post-procedure vital signs reviewed and stable Respiratory status: spontaneous breathing, nonlabored ventilation, respiratory function stable and patient connected to nasal cannula oxygen Cardiovascular status: blood pressure returned to baseline and stable Postop Assessment: no signs of nausea or vomiting Anesthetic complications: no     Last Vitals:  Vitals:   08/13/16 1116 08/13/16 1118  BP: (!) 81/39 (!) 96/50  Pulse: 78 75  Resp: 18   Temp: 36.4 C     Last Pain:  Vitals:   08/13/16 1321  TempSrc:   PainSc: 4                  Yevette Edwards

## 2016-08-17 NOTE — ED Notes (Signed)
Dr Forbach and Butch, RN at bedside at this time. 

## 2016-11-01 IMAGING — CR DG ABDOMEN 1V
1 series · 2 of 2 positions shown · non-contrast
Comparison: CT 06/08/2014

CLINICAL DATA: 53-year-old with constipation for 3 days.

EXAM:
ABDOMEN - 1 VIEW

[Series 1: dxr kidney ureter bladder · 0.14mm/px · 2 of 2 slices shown]
[im 1/2]
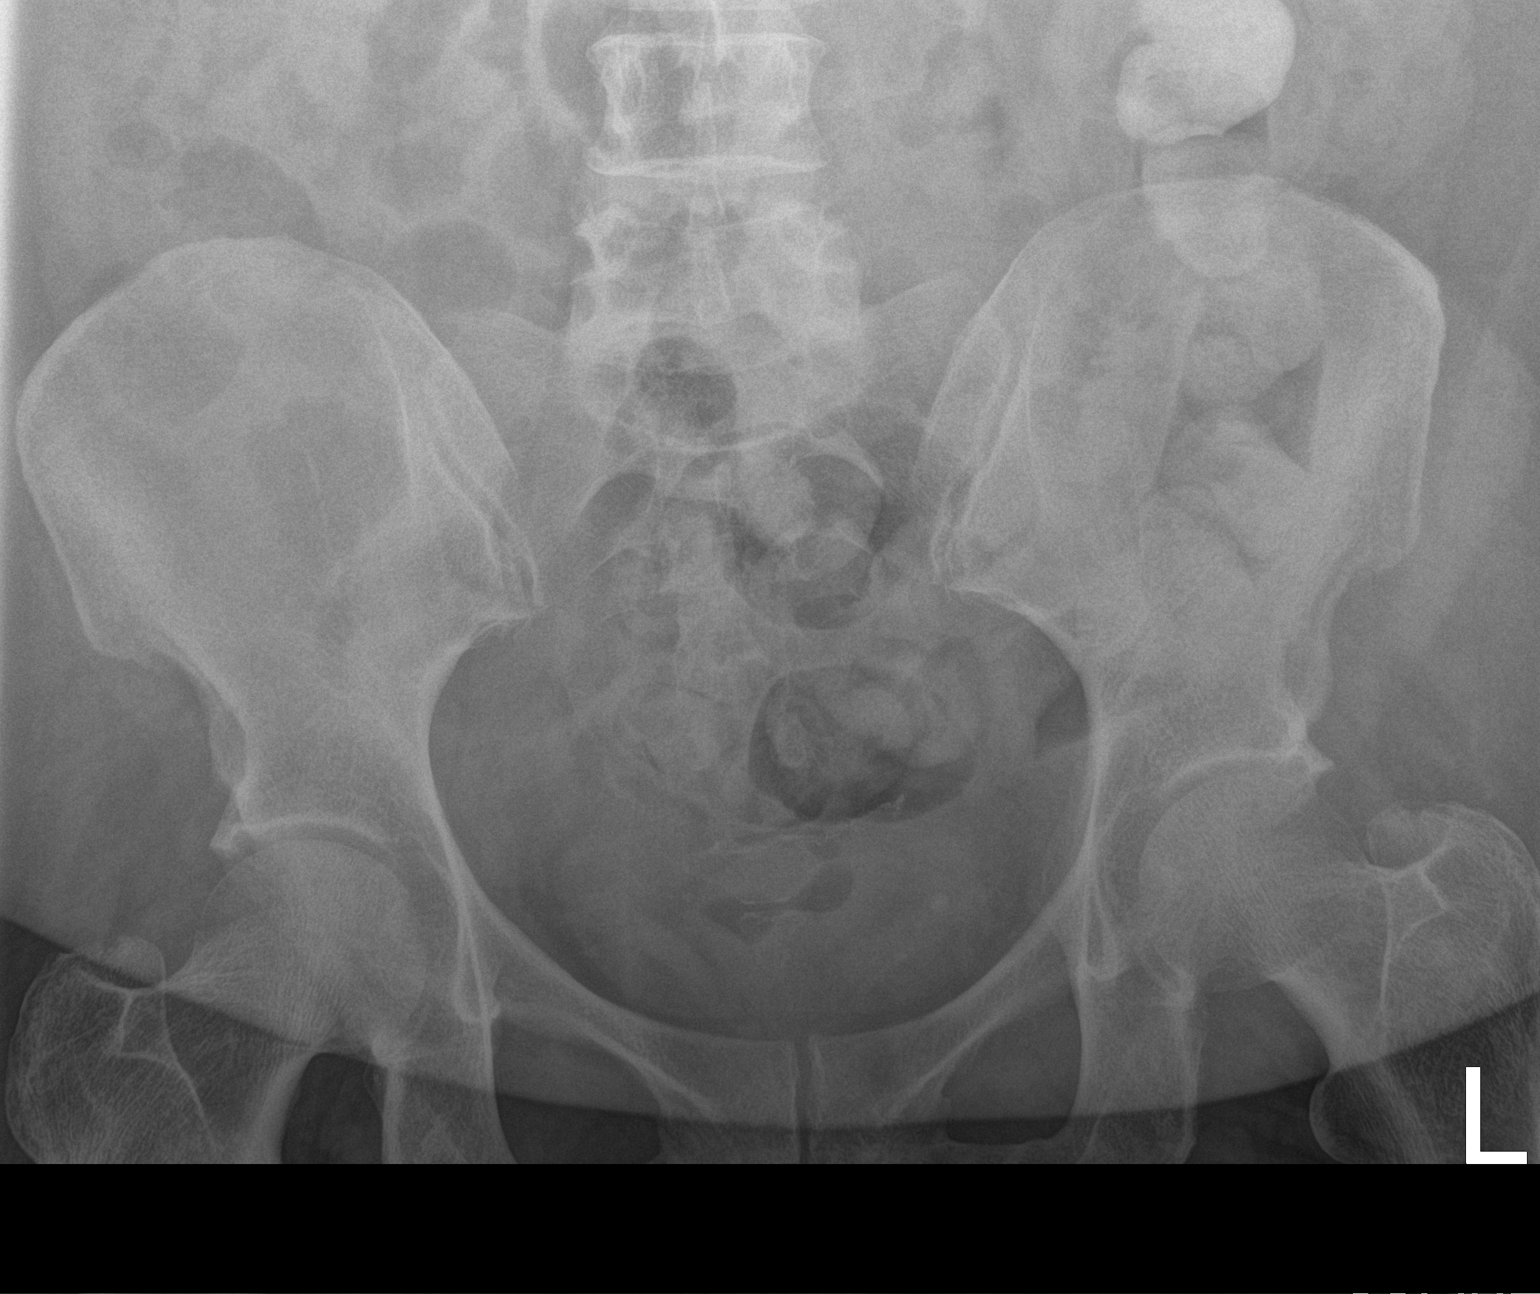
[im 2/2]
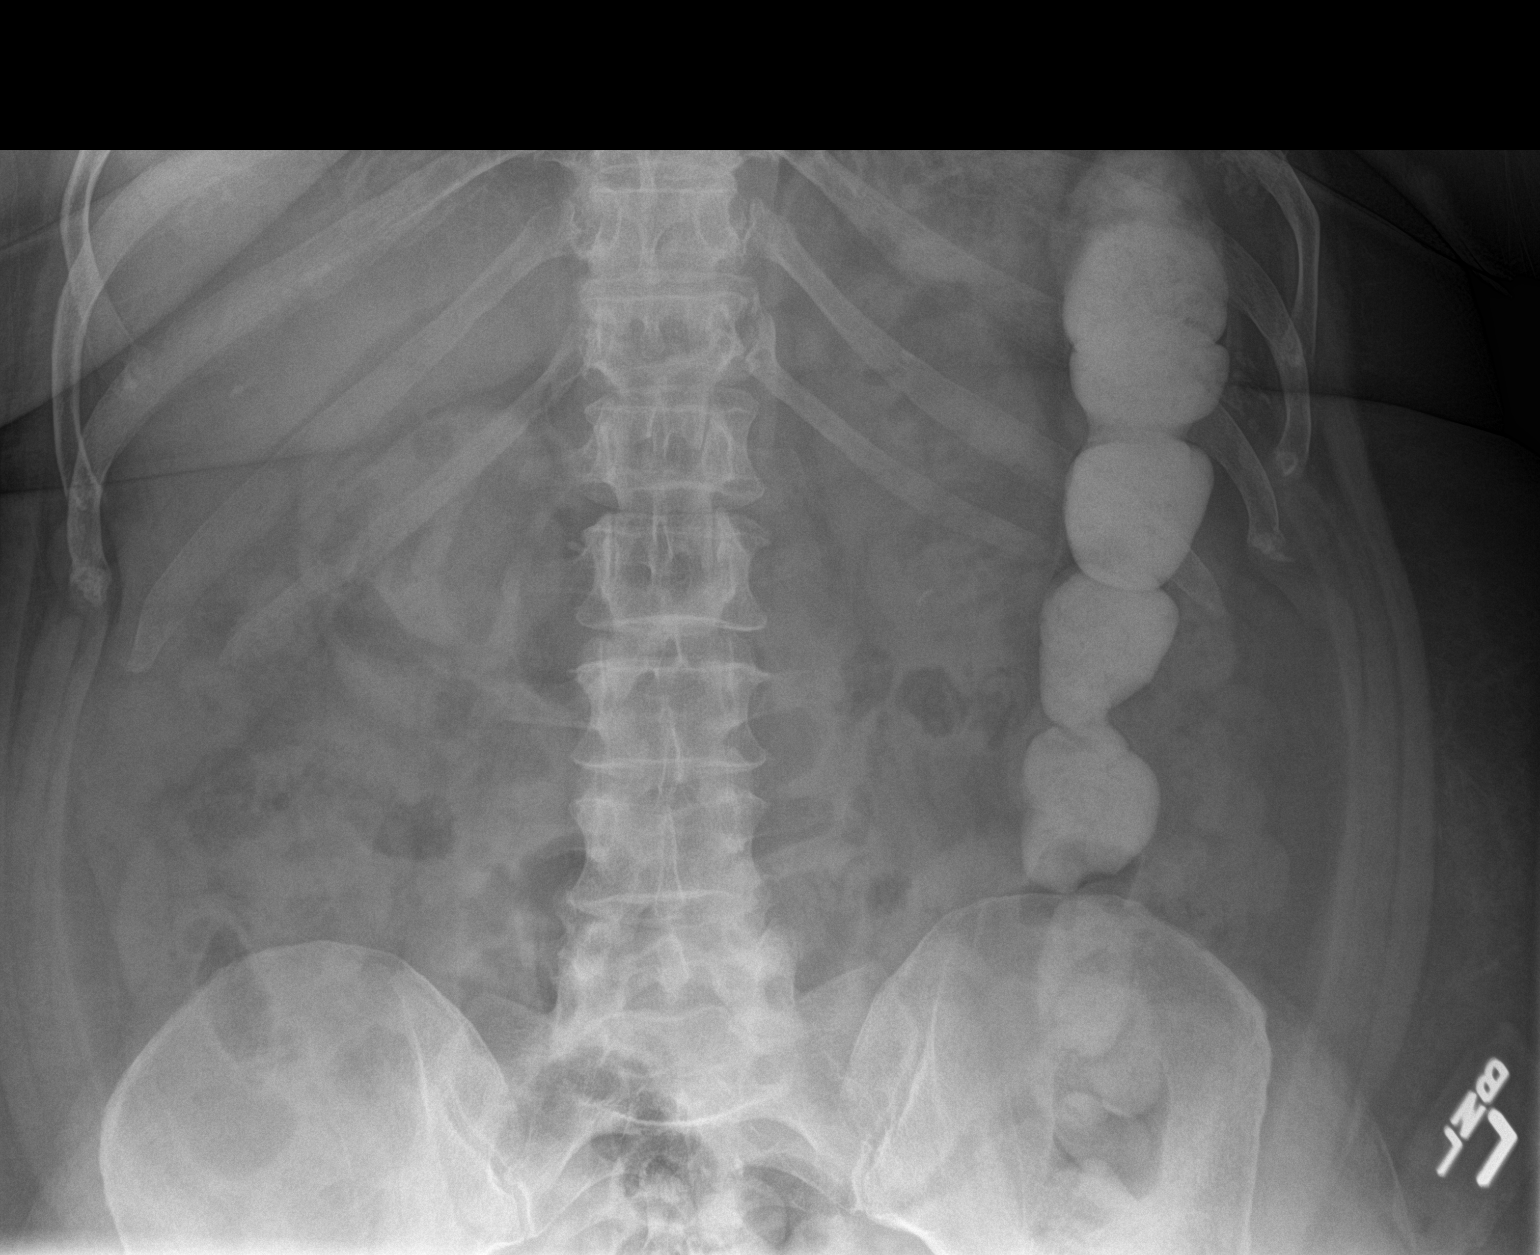

[2 of 2 positions shown; findings below may reference images not displayed]

FINDINGS: There is a nonobstructive bowel gas pattern. There is oral contrast
in the left colon probably administered on 06/08/2014. There appears
to be gas in the rectal region. No large abdominal calcifications.
IMPRESSION: Nonspecific bowel gas pattern.  Contrast in the left colon.

## 2016-11-30 ENCOUNTER — Encounter: Payer: Self-pay | Admitting: Emergency Medicine

## 2016-11-30 ENCOUNTER — Emergency Department: Payer: Medicare Other

## 2016-11-30 ENCOUNTER — Emergency Department
Admission: EM | Admit: 2016-11-30 | Discharge: 2016-11-30 | Disposition: A | Discharge status: Home / Self Care | Payer: Medicare Other | Attending: Student in an Organized Health Care Education/Training Program | Admitting: Student in an Organized Health Care Education/Training Program

## 2016-11-30 DIAGNOSIS — Z79899 Other long term (current) drug therapy: Secondary | ICD-10-CM | POA: Insufficient documentation

## 2016-11-30 DIAGNOSIS — Y998 Other external cause status: Secondary | ICD-10-CM | POA: Diagnosis not present

## 2016-11-30 DIAGNOSIS — Y939 Activity, unspecified: Secondary | ICD-10-CM | POA: Diagnosis not present

## 2016-11-30 DIAGNOSIS — M542 Cervicalgia: Secondary | ICD-10-CM | POA: Diagnosis not present

## 2016-11-30 DIAGNOSIS — G44319 Acute post-traumatic headache, not intractable: Secondary | ICD-10-CM

## 2016-11-30 DIAGNOSIS — Z7982 Long term (current) use of aspirin: Secondary | ICD-10-CM | POA: Insufficient documentation

## 2016-11-30 DIAGNOSIS — R51 Headache: Secondary | ICD-10-CM | POA: Diagnosis present

## 2016-11-30 DIAGNOSIS — I10 Essential (primary) hypertension: Secondary | ICD-10-CM | POA: Diagnosis not present

## 2016-11-30 DIAGNOSIS — Y929 Unspecified place or not applicable: Secondary | ICD-10-CM | POA: Insufficient documentation

## 2016-11-30 DIAGNOSIS — S80211A Abrasion, right knee, initial encounter: Secondary | ICD-10-CM | POA: Diagnosis not present

## 2016-11-30 MED ORDER — DIPHENHYDRAMINE HCL 50 MG/ML IJ SOLN
12.5000 mg | Freq: Once | INTRAMUSCULAR | Status: AC
  Administered 2016-11-30: 12.5 mg via INTRAMUSCULAR

## 2016-11-30 MED ORDER — BUTALBITAL-APAP-CAFFEINE 50-325-40 MG PO TABS: 1.0000 | ORAL_TABLET | Freq: Four times a day (QID) | ORAL | 0 refills | Status: AC | PRN

## 2016-11-30 MED ORDER — DIPHENHYDRAMINE HCL 50 MG/ML IJ SOLN
INTRAMUSCULAR | Status: AC
  Administered 2016-11-30: 12.5 mg via INTRAMUSCULAR
  Filled 2016-11-30: qty 1

## 2016-11-30 MED ORDER — PROCHLORPERAZINE EDISYLATE 5 MG/ML IJ SOLN
INTRAMUSCULAR | Status: AC
  Administered 2016-11-30: 10 mg via INTRAMUSCULAR
  Filled 2016-11-30: qty 2

## 2016-11-30 MED ORDER — ACETAMINOPHEN 325 MG PO TABS
650.0000 mg | ORAL_TABLET | Freq: Once | ORAL | Status: AC
  Administered 2016-11-30: 650 mg via ORAL

## 2016-11-30 MED ORDER — ACETAMINOPHEN 325 MG PO TABS
ORAL_TABLET | ORAL | Status: AC
  Administered 2016-11-30: 650 mg via ORAL
  Filled 2016-11-30: qty 2

## 2016-11-30 MED ORDER — PROCHLORPERAZINE EDISYLATE 5 MG/ML IJ SOLN
10.0000 mg | Freq: Once | INTRAMUSCULAR | Status: AC
  Administered 2016-11-30: 10 mg via INTRAMUSCULAR

## 2016-11-30 NOTE — Discharge Instructions (Signed)
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
## 2016-12-13 DIAGNOSIS — F0781 Postconcussional syndrome: Secondary | ICD-10-CM | POA: Insufficient documentation

## 2017-02-25 ENCOUNTER — Inpatient Hospital Stay
Admission: EM | Admit: 2017-02-25 | Discharge: 2017-03-05 | DRG: 689 | Disposition: A | Discharge status: Home / Self Care | Payer: Medicare Other | Attending: Internal Medicine | Admitting: Internal Medicine

## 2017-02-25 ENCOUNTER — Inpatient Hospital Stay: Payer: Medicare Other

## 2017-02-25 ENCOUNTER — Encounter: Payer: Self-pay | Admitting: Emergency Medicine

## 2017-02-25 DIAGNOSIS — E890 Postprocedural hypothyroidism: Secondary | ICD-10-CM | POA: Diagnosis present

## 2017-02-25 DIAGNOSIS — M797 Fibromyalgia: Secondary | ICD-10-CM | POA: Diagnosis present

## 2017-02-25 DIAGNOSIS — G894 Chronic pain syndrome: Secondary | ICD-10-CM | POA: Diagnosis present

## 2017-02-25 DIAGNOSIS — R338 Other retention of urine: Secondary | ICD-10-CM | POA: Diagnosis not present

## 2017-02-25 DIAGNOSIS — Z87442 Personal history of urinary calculi: Secondary | ICD-10-CM | POA: Diagnosis not present

## 2017-02-25 DIAGNOSIS — Z23 Encounter for immunization: Secondary | ICD-10-CM | POA: Diagnosis present

## 2017-02-25 DIAGNOSIS — R1031 Right lower quadrant pain: Secondary | ICD-10-CM | POA: Diagnosis not present

## 2017-02-25 DIAGNOSIS — I10 Essential (primary) hypertension: Secondary | ICD-10-CM | POA: Diagnosis present

## 2017-02-25 DIAGNOSIS — K5900 Constipation, unspecified: Secondary | ICD-10-CM | POA: Diagnosis present

## 2017-02-25 DIAGNOSIS — N3010 Interstitial cystitis (chronic) without hematuria: Principal | ICD-10-CM | POA: Diagnosis present

## 2017-02-25 DIAGNOSIS — R251 Tremor, unspecified: Secondary | ICD-10-CM | POA: Diagnosis present

## 2017-02-25 DIAGNOSIS — A419 Sepsis, unspecified organism: Secondary | ICD-10-CM | POA: Diagnosis not present

## 2017-02-25 DIAGNOSIS — R109 Unspecified abdominal pain: Secondary | ICD-10-CM | POA: Diagnosis not present

## 2017-02-25 DIAGNOSIS — G8918 Other acute postprocedural pain: Secondary | ICD-10-CM

## 2017-02-25 DIAGNOSIS — Z90710 Acquired absence of both cervix and uterus: Secondary | ICD-10-CM

## 2017-02-25 DIAGNOSIS — N390 Urinary tract infection, site not specified: Secondary | ICD-10-CM | POA: Diagnosis present

## 2017-02-25 DIAGNOSIS — N3000 Acute cystitis without hematuria: Secondary | ICD-10-CM | POA: Diagnosis not present

## 2017-02-25 DIAGNOSIS — R509 Fever, unspecified: Secondary | ICD-10-CM | POA: Diagnosis not present

## 2017-02-25 LAB — COMPREHENSIVE METABOLIC PANEL
ALK PHOS: 78 U/L (ref 38–126)
ALT: 9 U/L — ABNORMAL LOW (ref 14–54)
ANION GAP: 14 (ref 5–15)
AST: 18 U/L (ref 15–41)
Albumin: 4 g/dL (ref 3.5–5.0)
BUN: 26 mg/dL — ABNORMAL HIGH (ref 6–20)
CALCIUM: 9.4 mg/dL (ref 8.9–10.3)
CO2: 24 mmol/L (ref 22–32)
Chloride: 103 mmol/L (ref 101–111)
Creatinine, Ser: 1.5 mg/dL — ABNORMAL HIGH (ref 0.44–1.00)
GFR calc non Af Amer: 38 mL/min — ABNORMAL LOW (ref >60)
GFR, EST AFRICAN AMERICAN: 44 mL/min — AB (ref >60)
Glucose, Bld: 126 mg/dL — ABNORMAL HIGH (ref 65–99)
POTASSIUM: 4 mmol/L (ref 3.5–5.1)
SODIUM: 141 mmol/L (ref 135–145)
TOTAL PROTEIN: 7.9 g/dL (ref 6.5–8.1)
Total Bilirubin: 0.7 mg/dL (ref 0.3–1.2)

## 2017-02-25 LAB — URINALYSIS, COMPLETE (UACMP) WITH MICROSCOPIC
Bilirubin Urine: NEGATIVE
GLUCOSE, UA: NEGATIVE mg/dL
KETONES UR: NEGATIVE mg/dL
Leukocytes, UA: NEGATIVE
NITRITE: POSITIVE — AB
Protein, ur: 100 mg/dL — AB
Specific Gravity, Urine: 1.027 (ref 1.005–1.030)
pH: 5 (ref 5.0–8.0)

## 2017-02-25 LAB — CBC
HEMATOCRIT: 39.8 % (ref 35.0–47.0)
HEMOGLOBIN: 12.9 g/dL (ref 12.0–16.0)
MCH: 27.5 pg (ref 26.0–34.0)
MCHC: 32.3 g/dL (ref 32.0–36.0)
MCV: 85.1 fL (ref 80.0–100.0)
Platelets: 344 10*3/uL (ref 150–440)
RBC: 4.68 MIL/uL (ref 3.80–5.20)
RDW: 13.7 % (ref 11.5–14.5)
WBC: 11.9 10*3/uL — ABNORMAL HIGH (ref 3.6–11.0)

## 2017-02-25 LAB — LIPASE, BLOOD: Lipase: 16 U/L (ref 11–51)

## 2017-02-25 MED ORDER — ENOXAPARIN SODIUM 40 MG/0.4ML ~~LOC~~ SOLN
40.0000 mg | SUBCUTANEOUS | Status: DC
  Administered 2017-02-26: 09:00:00 40 mg via SUBCUTANEOUS
  Filled 2017-02-25: qty 0.4

## 2017-02-25 MED ORDER — ONDANSETRON HCL 4 MG/2ML IJ SOLN
4.0000 mg | Freq: Once | INTRAMUSCULAR | Status: AC
  Administered 2017-02-25: 4 mg via INTRAVENOUS
  Filled 2017-02-25: qty 2

## 2017-02-25 MED ORDER — TIZANIDINE HCL 2 MG PO TABS: 2.0000 mg | ORAL_TABLET | Freq: Three times a day (TID) | ORAL | Status: DC | PRN

## 2017-02-25 MED ORDER — HYDROMORPHONE HCL 1 MG/ML IJ SOLN
1.0000 mg | Freq: Once | INTRAMUSCULAR | Status: AC
  Administered 2017-02-25: 1 mg via INTRAVENOUS
  Filled 2017-02-25: qty 1

## 2017-02-25 MED ORDER — LOSARTAN POTASSIUM 25 MG PO TABS
25.0000 mg | ORAL_TABLET | Freq: Every day | ORAL | Status: DC
  Administered 2017-02-25 – 2017-03-01 (×4): 25 mg via ORAL
  Filled 2017-02-25 (×5): qty 1

## 2017-02-25 MED ORDER — PROPRANOLOL HCL 40 MG PO TABS
40.0000 mg | ORAL_TABLET | Freq: Two times a day (BID) | ORAL | Status: DC
  Administered 2017-02-25 – 2017-03-01 (×3): 40 mg via ORAL
  Filled 2017-02-25 (×11): qty 1

## 2017-02-25 MED ORDER — ONDANSETRON HCL 4 MG PO TABS
4.0000 mg | ORAL_TABLET | Freq: Four times a day (QID) | ORAL | Status: DC | PRN
  Administered 2017-03-04 (×2): 4 mg via ORAL
  Filled 2017-02-25 (×3): qty 1

## 2017-02-25 MED ORDER — KETOROLAC TROMETHAMINE 30 MG/ML IJ SOLN
30.0000 mg | Freq: Once | INTRAMUSCULAR | Status: AC
  Administered 2017-02-25: 17:00:00 30 mg via INTRAVENOUS
  Filled 2017-02-25: qty 1

## 2017-02-25 MED ORDER — FENTANYL CITRATE (PF) 100 MCG/2ML IJ SOLN: 50.0000 ug | INTRAMUSCULAR | Status: DC | PRN

## 2017-02-25 MED ORDER — HYDROXYZINE HCL 25 MG PO TABS
25.0000 mg | ORAL_TABLET | Freq: Two times a day (BID) | ORAL | Status: DC
  Administered 2017-02-25 – 2017-03-01 (×8): 25 mg via ORAL
  Filled 2017-02-25 (×18): qty 1

## 2017-02-25 MED ORDER — KETOROLAC TROMETHAMINE 30 MG/ML IJ SOLN
10.0000 mg | Freq: Once | INTRAMUSCULAR | Status: AC
  Administered 2017-02-25: 9.9 mg via INTRAVENOUS
  Filled 2017-02-25: qty 1

## 2017-02-25 MED ORDER — ONDANSETRON 4 MG PO TBDP
4.0000 mg | ORAL_TABLET | Freq: Once | ORAL | Status: AC | PRN
  Administered 2017-02-25: 4 mg via ORAL
  Filled 2017-02-25: qty 1

## 2017-02-25 MED ORDER — HYDROCODONE-ACETAMINOPHEN 5-325 MG PO TABS
1.0000 | ORAL_TABLET | ORAL | Status: DC | PRN
  Administered 2017-02-25 – 2017-02-26 (×2): 2 via ORAL
  Filled 2017-02-25 (×2): qty 2

## 2017-02-25 MED ORDER — PREGABALIN 75 MG PO CAPS
150.0000 mg | ORAL_CAPSULE | Freq: Three times a day (TID) | ORAL | Status: DC
  Administered 2017-02-25 – 2017-02-27 (×6): 150 mg via ORAL
  Filled 2017-02-25 (×6): qty 2

## 2017-02-25 MED ORDER — BUTALBITAL-APAP-CAFFEINE 50-325-40 MG PO TABS
1.0000 | ORAL_TABLET | Freq: Four times a day (QID) | ORAL | Status: DC | PRN
  Filled 2017-02-25: qty 2

## 2017-02-25 MED ORDER — SODIUM CHLORIDE 0.9 % IV BOLUS (SEPSIS)
1000.0000 mL | Freq: Once | INTRAVENOUS | Status: AC
  Administered 2017-02-25: 1000 mL via INTRAVENOUS

## 2017-02-25 MED ORDER — FENTANYL CITRATE (PF) 100 MCG/2ML IJ SOLN
50.0000 ug | INTRAMUSCULAR | Status: AC | PRN
  Administered 2017-02-25 (×2): 50 ug via NASAL
  Filled 2017-02-25 (×2): qty 2

## 2017-02-25 MED ORDER — SODIUM CHLORIDE 0.9 % IV SOLN
INTRAVENOUS | Status: DC
  Administered 2017-02-25 – 2017-03-03 (×9): via INTRAVENOUS

## 2017-02-25 MED ORDER — POLYETHYLENE GLYCOL 3350 17 G PO PACK
17.0000 g | PACK | Freq: Every day | ORAL | Status: DC | PRN
  Administered 2017-02-26 – 2017-03-03 (×2): 17 g via ORAL
  Filled 2017-02-25 (×2): qty 1

## 2017-02-25 MED ORDER — PENTOSAN POLYSULFATE SODIUM 100 MG PO CAPS
100.0000 mg | ORAL_CAPSULE | Freq: Three times a day (TID) | ORAL | Status: DC
  Administered 2017-02-25 – 2017-03-02 (×14): 100 mg via ORAL
  Filled 2017-02-25 (×27): qty 1

## 2017-02-25 MED ORDER — MIRABEGRON ER 50 MG PO TB24
50.0000 mg | ORAL_TABLET | Freq: Every day | ORAL | Status: DC
  Administered 2017-02-25 – 2017-03-04 (×8): 50 mg via ORAL
  Filled 2017-02-25 (×10): qty 1

## 2017-02-25 MED ORDER — ASPIRIN EC 81 MG PO TBEC
81.0000 mg | DELAYED_RELEASE_TABLET | Freq: Every day | ORAL | Status: DC
  Administered 2017-02-25 – 2017-03-05 (×9): 81 mg via ORAL
  Filled 2017-02-25 (×9): qty 1

## 2017-02-25 MED ORDER — ONDANSETRON HCL 4 MG/2ML IJ SOLN
4.0000 mg | Freq: Four times a day (QID) | INTRAMUSCULAR | Status: DC | PRN
  Administered 2017-02-27 – 2017-03-05 (×10): 4 mg via INTRAVENOUS
  Filled 2017-02-25 (×11): qty 2

## 2017-02-25 MED ORDER — ACETAMINOPHEN 325 MG PO TABS
650.0000 mg | ORAL_TABLET | Freq: Four times a day (QID) | ORAL | Status: DC | PRN
  Administered 2017-02-27: 650 mg via ORAL
  Filled 2017-02-25 (×2): qty 2

## 2017-02-25 MED ORDER — DIAZEPAM 2 MG PO TABS
2.0000 mg | ORAL_TABLET | Freq: Once | ORAL | Status: AC
  Administered 2017-02-25: 2 mg via ORAL
  Filled 2017-02-25: qty 1

## 2017-02-25 MED ORDER — HYDRALAZINE HCL 20 MG/ML IJ SOLN: 10.0000 mg | Freq: Four times a day (QID) | INTRAMUSCULAR | Status: DC | PRN

## 2017-02-25 MED ORDER — CYCLOBENZAPRINE HCL 10 MG PO TABS
10.0000 mg | ORAL_TABLET | Freq: Three times a day (TID) | ORAL | Status: DC | PRN
  Administered 2017-02-28 – 2017-03-04 (×2): 10 mg via ORAL
  Filled 2017-02-25 (×3): qty 1

## 2017-02-25 MED ORDER — LEVOTHYROXINE SODIUM 25 MCG PO TABS
225.0000 ug | ORAL_TABLET | Freq: Every day | ORAL | Status: DC
  Administered 2017-02-25 – 2017-03-04 (×8): 225 ug via ORAL
  Filled 2017-02-25 (×9): qty 1

## 2017-02-25 MED ORDER — NORTRIPTYLINE HCL 25 MG PO CAPS
100.0000 mg | ORAL_CAPSULE | Freq: Every day | ORAL | Status: DC
  Administered 2017-02-25 – 2017-02-28 (×3): 100 mg via ORAL
  Filled 2017-02-25 (×9): qty 4

## 2017-02-25 MED ORDER — CEFTRIAXONE SODIUM IN DEXTROSE 20 MG/ML IV SOLN
1.0000 g | Freq: Once | INTRAVENOUS | Status: AC
  Administered 2017-02-25: 1 g via INTRAVENOUS
  Filled 2017-02-25: qty 50

## 2017-02-25 MED ORDER — ALBUTEROL SULFATE (2.5 MG/3ML) 0.083% IN NEBU: 2.5000 mg | INHALATION_SOLUTION | RESPIRATORY_TRACT | Status: DC | PRN

## 2017-02-25 MED ORDER — MORPHINE SULFATE (PF) 2 MG/ML IV SOLN
2.0000 mg | INTRAVENOUS | Status: DC | PRN
  Administered 2017-02-25 – 2017-02-26 (×5): 2 mg via INTRAVENOUS
  Filled 2017-02-25 (×6): qty 1

## 2017-02-25 MED ORDER — FAMOTIDINE 20 MG PO TABS
20.0000 mg | ORAL_TABLET | Freq: Every day | ORAL | Status: DC
  Administered 2017-02-25 – 2017-03-04 (×8): 20 mg via ORAL
  Filled 2017-02-25 (×8): qty 1

## 2017-02-25 MED ORDER — INFLUENZA VAC SPLIT QUAD 0.5 ML IM SUSY
0.5000 mL | PREFILLED_SYRINGE | INTRAMUSCULAR | Status: AC
  Administered 2017-02-26: 10:00:00 0.5 mL via INTRAMUSCULAR
  Filled 2017-02-25: qty 0.5

## 2017-02-25 MED ORDER — ACETAMINOPHEN 650 MG RE SUPP: 650.0000 mg | Freq: Four times a day (QID) | RECTAL | Status: DC | PRN

## 2017-02-25 MED ORDER — ALUM & MAG HYDROXIDE-SIMETH 200-200-20 MG/5ML PO SUSP
30.0000 mL | ORAL | Status: DC | PRN
  Administered 2017-02-25: 13:00:00 30 mL via ORAL
  Filled 2017-02-25: qty 30

## 2017-02-25 MED ORDER — DEXTROSE 5 % IV SOLN
1.0000 g | INTRAVENOUS | Status: DC
  Administered 2017-02-26 – 2017-03-02 (×5): 1 g via INTRAVENOUS
  Filled 2017-02-25 (×6): qty 10

## 2017-02-25 NOTE — ED Provider Notes (Signed)
[facility] Emergency Department Provider Note   ____________________________________________   First MD Initiated Contact with Patient 02/25/17 0522     (approximate)  I have reviewed the triage vital signs and the nursing notes.   HISTORY  Chief Complaint Flank Pain and Post-op Problem    HPI Joanna James is a 56 y.o. female who presents to the ED from home with a chief complaint of flank pain. Patient is status postthe following procedures on 10/12 at [facility] for right renal stone and interstitial cystitis:  1. Cystourethroscopy.  2. Right retrograde pyelogram  3. Right diagnostic ureteroscopy .  4. Hydrodistention of the bladder under general anesthesia. 5. Periurethral trigger point injection of bupivacaine and triamcinolone for myalgia. 6. Bladder installation with phenazopyridine and bupivacaine. 7. Insertion of B and O suppository.  States she was discharged home with Foley catheter, Keflex and tramadol for pain. Reports tramadol is not alleviating her pain. Symptoms associated with nausea. Denies associated fever, chills, chest pain, shortness of breath, abdominal pain, vomiting, diarrhea. Denies recent travel or trauma.    Past Medical History:  Diagnosis Date  . Chronic pain syndrome   . Fibromyalgia   . Gout   . Hypertension   . IC (interstitial cystitis)   . Spinal stenosis     Patient Active Problem List   Diagnosis Date Noted  . Pain 08/11/2016  . Abdominal pain 06/21/2016    Past Surgical History:  Procedure Laterality Date  . ABDOMINAL HYSTERECTOMY    . APPENDECTOMY    . BLADDER SUSPENSION    . CYSTOSCOPY WITH STENT PLACEMENT Left 08/13/2016   Procedure: CYSTOSCOPY WITH STENT PLACEMENT;  Surgeon: Bjorn Pippin, MD;  Location: [facility] ORS;  Service: Urology;  Laterality: Left;  . ECTOPIC PREGNANCY SURGERY    . meniscus tear    . ROTATOR CUFF REPAIR    . THYROIDECTOMY    . TUBAL LIGATION      Prior to  Admission medications   Medication Sig Start Date End Date Taking? Authorizing Provider  amitriptyline (ELAVIL) 50 MG tablet Take 50 mg by mouth at bedtime. 02/21/16 02/20/17  [provider]  Apremilast 10 & 20 & 30 MG TBPK Take 10 mg by mouth every morning.    [provider]  aspirin EC 81 MG tablet Take 81 mg by mouth daily. 02/23/14   [provider]  butalbital-acetaminophen-caffeine Marikay Alar, ESGIC) [phone] MG tablet Take 1-2 tablets by mouth every 6 (six) hours as needed for headache. 11/30/16 11/30/17  Willy Eddy, MD  cyclobenzaprine (FLEXERIL) 10 MG tablet Take 1 tablet (10 mg total) by mouth every 8 (eight) hours as needed for muscle spasms. 04/26/15   Beers, Charmayne Sheer, PA-C  diclofenac (FLECTOR) 1.3 % PTCH Place 1 patch onto the skin 2 (two) times daily.    [provider]  diclofenac sodium (VOLTAREN) 1 % GEL Apply 2 g topically 4 (four) times daily as needed.    [provider]  fluocinolone (VANOS) 0.01 % cream Apply 1 application topically 2 (two) times daily.    [provider]  hydrOXYzine (ATARAX/VISTARIL) 25 MG tablet Take 25 mg by mouth 2 (two) times daily.    [provider]  Hyoscyamine Sulfate SL 0.125 MG SUBL Place 0.125 mg under the tongue every 6 (six) hours as needed.    [provider]  levothyroxine (SYNTHROID, LEVOTHROID) 200 MCG tablet Take 225 mcg by mouth daily. 03/20/16   [provider]  levothyroxine (SYNTHROID, LEVOTHROID)  25 MCG tablet Take 25 mcg by mouth daily before breakfast.    [provider]  lisinopril (PRINIVIL,ZESTRIL) 10 MG tablet Take 10 mg by mouth daily.    [provider]  mirabegron ER (MYRBETRIQ) 50 MG TB24 tablet Take 50 mg by mouth daily.    [provider]  ondansetron (ZOFRAN ODT) 4 MG disintegrating tablet Allow 1-2 tablets to dissolve in your mouth every 8 hours as needed for nausea/vomiting 08/17/16   Loleta Rose, MD    opium-belladonna (B&O SUPPRETTES) 16.2-60 MG suppository Place 1 suppository rectally every 8 (eight) hours as needed for bladder spasms. 08/13/16   Adrian Saran, MD  oxybutynin (DITROPAN) 5 MG tablet Take 1 tablet (5 mg total) by mouth 3 (three) times daily as needed for bladder spasms. 08/17/16   Loleta Rose, MD  pentosan polysulfate (ELMIRON) 100 MG capsule Take 100 mg by mouth 3 (three) times daily.    [provider]  pentoxifylline (TRENTAL) 400 MG CR tablet Take 400 mg by mouth 2 (two) times daily.    [provider]  phenazopyridine (PYRIDIUM) 100 MG tablet Take 1 tablet (100 mg total) by mouth 3 (three) times daily with meals. 08/12/16   Adrian Saran, MD  pregabalin (LYRICA) 100 MG capsule Take 100 mg by mouth 3 (three) times daily. 04/11/16   [provider]  promethazine (PHENERGAN) 12.5 MG tablet Take 1 tablet (12.5 mg total) by mouth every 6 (six) hours as needed for nausea or vomiting. 05/30/15   Minna Antis, MD  ranitidine (ZANTAC) 300 MG tablet Take 300 mg by mouth at bedtime. 03/09/16 03/09/17  [provider]  tamsulosin (FLOMAX) 0.4 MG CAPS capsule Take 0.4 mg by mouth daily.    [provider]  tiZANidine (ZANAFLEX) 2 MG tablet Take 2 mg by mouth every 8 (eight) hours as needed for muscle spasms. 04/11/16   [provider]  traMADol (ULTRAM) 50 MG tablet Take 1 tablet (50 mg total) by mouth 3 (three) times daily as needed for moderate pain or severe pain. 06/22/16   Enedina Finner, MD  trimethoprim (TRIMPEX) 100 MG tablet Take 100 mg by mouth 2 (two) times daily.    [provider]    Allergies Amoxicillin; Azithromycin; Ciprofloxacin; Hydrocodone; Percocet [oxycodone-acetaminophen]; and Sulfa antibiotics  No family history on file.  Social History Social History  Substance Use Topics  . Smoking status: Never Smoker  . Smokeless tobacco: Never Used  . Alcohol use Yes     Comment: occasional    Review of  Systems  Constitutional: No fever/chills. Eyes: No visual changes. ENT: No sore throat. Cardiovascular: Denies chest pain. Respiratory: Denies shortness of breath. Gastrointestinal: positive for right flank pain. No abdominal pain.  positive for nausea, no vomiting.  No diarrhea.  No constipation. Genitourinary: Negative for dysuria. Musculoskeletal: Negative for back pain. Skin: Negative for rash. Neurological: Negative for headaches, focal weakness or numbness.   ____________________________________________   PHYSICAL EXAM:  VITAL SIGNS: ED Triage Vitals  Enc Vitals Group     BP 02/25/17 0128 (!) 175/84     Pulse Rate 02/25/17 0127 64     Resp 02/25/17 0127 18     Temp 02/25/17 0127 97.8 F (36.6 C)     Temp Source 02/25/17 0127 Oral     SpO2 02/25/17 0127 94 %     Weight 02/25/17 0127 240 lb (108.9 kg)     Height 02/25/17 0127  (1.6 m)     Head Circumference --  Peak Flow --      Pain Score 02/25/17 0127 10     Pain Loc --      Pain Edu? --      Excl. in GC? --     Constitutional: Alert and oriented. Well appearing and in mild acute distress. Eyes: Conjunctivae are normal. PERRL. EOMI. Head: Atraumatic. Nose: No congestion/rhinnorhea. Mouth/Throat: Mucous membranes are moist.  Oropharynx non-erythematous. Neck: No stridor.   Cardiovascular: Normal rate, regular rhythm. Grossly normal heart sounds.  Good peripheral circulation. Respiratory: Normal respiratory effort.  No retractions. Lungs CTAB. Gastrointestinal: Soft and nontender to light or deep palpation. No distention. No abdominal bruits. Right CVA tenderness. Foley catheter in place. Musculoskeletal: No lower extremity tenderness nor edema.  No joint effusions. Neurologic:  Normal speech and language. No gross focal neurologic deficits are appreciated.  Skin:  Skin is warm, dry and intact. No rash noted. Psychiatric: Mood and affect are normal. Speech and behavior are  normal.  ____________________________________________   LABS (all labs ordered are listed, but only abnormal results are displayed)  Labs Reviewed  COMPREHENSIVE METABOLIC PANEL - Abnormal; Notable for the following:       Result Value   Glucose, Bld 126 (*)    BUN 26 (*)    Creatinine, Ser 1.50 (*)    ALT 9 (*)    GFR calc non Af Amer 38 (*)    GFR calc Af Amer 44 (*)    All other components within normal limits  CBC - Abnormal; Notable for the following:    WBC 11.9 (*)    All other components within normal limits  URINALYSIS, COMPLETE (UACMP) WITH MICROSCOPIC - Abnormal; Notable for the following:    Color, Urine AMBER (*)    APPearance HAZY (*)    Hgb urine dipstick LARGE (*)    Protein, ur 100 (*)    Nitrite POSITIVE (*)    Bacteria, UA FEW (*)    Squamous Epithelial / LPF 0-5 (*)    All other components within normal limits  LIPASE, BLOOD   ____________________________________________  EKG  None ____________________________________________  RADIOLOGY  No results found.  ____________________________________________   PROCEDURES  Procedure(s) performed: None  Procedures  Critical Care performed: No  ____________________________________________   INITIAL IMPRESSION / ASSESSMENT AND PLAN / ED COURSE  As part of my medical decision making, I reviewed the following data within the electronic MEDICAL RECORD NUMBER Nursing notes reviewed and incorporated, Old chart reviewed and Notes from prior ED visits   56 year old female with an extensive history of kidney stones and interstitial cystitis who presents with postoperative pain status post several urological procedures performed under general anesthesia 2 days ago. Differential diagnosis includes, but is not limited to, ovarian cyst, ovarian torsion, acute appendicitis, diverticulitis, urinary tract infection/pyelonephritis, endometriosis, bowel obstruction, colitis, renal colic, gastroenteritis, hernia, etc.  laboratory urinalysis. Results remarkable for mild renal insufficiency, positive nitrites and TNTC WBC. Patient was discharged home on Keflex and tramadol which is not alleviating her pain. Will administer IV Rocephin, Dilaudid, oral Valium for probable bladder spasms and reassess.   Clinical Course as of Feb 25 710  Wynelle Link Feb 25, 2017  1610 No significant relief after IV Toradol.  [JS]  0710 Care and disposition transferred to Dr. Cyril Loosen pending reassessment of pain.   [JS]    Clinical Course User Index [JS] Irean Hong, MD     ____________________________________________   FINAL CLINICAL IMPRESSION(S) / ED DIAGNOSES  Final diagnoses:  Post-operative pain  Right flank pain  Lower urinary tract infectious disease      NEW MEDICATIONS STARTED DURING THIS VISIT:  New Prescriptions   No medications on file     Note:  This document was prepared using Dragon voice recognition software and may include unintentional dictation errors.    Irean Hong, MD 02/25/17 [phone]

## 2017-02-25 NOTE — Progress Notes (Signed)
CH responded to an OR for an AD. Pt was awake and able to be educated. CH educated Pt on the AD. Pt wants to review her options once she has had some rest.    02/25/17 1126  Clinical Encounter Type  Visited With Patient;Health care provider  Visit Type Initial;Spiritual support  Referral From Nurse  Consult/Referral To Chaplain  Spiritual Encounters  Spiritual Needs Literature  Advance Directives (For Healthcare)  Does Patient Have a Medical Advance Directive? No  Would patient like information on creating a medical advance directive? Yes (Inpatient - patient requests chaplain consult to create a medical advance directive)  Mental Health Advance Directives  Does Patient Have a Mental Health Advance Directive? No  Would patient like information on creating a mental health advance directive? No - Patient declined

## 2017-02-25 NOTE — ED Provider Notes (Signed)
Patient with continued pain after multiple doses of IV analagesics, will admit for further management   Joanna Every, MD 02/25/17 [phone]

## 2017-02-25 NOTE — H&P (Signed)
[facility] -  at [facility]   PATIENT NAME: Joanna James    MR#:  [id number]  DATE OF BIRTH:  09/25/1960  DATE OF ADMISSION:  02/25/2017  PRIMARY CARE PHYSICIAN: Rayetta Humphrey, MD   REQUESTING/REFERRING PHYSICIAN:   CHIEF COMPLAINT:   Chief Complaint  Patient presents with  . Flank Pain  . Post-op Problem    HISTORY OF PRESENT ILLNESS:  Joanna James  is a 56 y.o. female with a known history of Ureteral stones, fibromyalgia, chronic pain who was admitted to [facility] on 02/23/2017 for 1. Cystourethroscopy.  2. Right retrograde pyelogram  3. Right diagnostic ureteroscopy .  4. Hydrodistention of the bladder under general anesthesia. 5. Periurethral trigger point injection of bupivacaine and triamcinolone for myalgia. 6. Bladder installation with phenazopyridine and bupivacaine.  Presents to the emergency room with right flank pain and not feeling well. Here patient has received multiple doses of IV pain medications including fentanyl, Toradol, Dilaudid and continues to have pain and has been found to have UTI and is being admitted to the hospital.  PAST MEDICAL HISTORY:   Past Medical History:  Diagnosis Date  . Chronic pain syndrome   . Fibromyalgia   . Gout   . Hypertension   . IC (interstitial cystitis)   . Spinal stenosis     PAST SURGICAL HISTORY:   Past Surgical History:  Procedure Laterality Date  . ABDOMINAL HYSTERECTOMY    . APPENDECTOMY    . BLADDER SUSPENSION    . CYSTOSCOPY WITH STENT PLACEMENT Left 08/13/2016   Procedure: CYSTOSCOPY WITH STENT PLACEMENT;  Surgeon: Bjorn Pippin, MD;  Location: [facility] ORS;  Service: Urology;  Laterality: Left;  . ECTOPIC PREGNANCY SURGERY    . meniscus tear    . ROTATOR CUFF REPAIR    . THYROIDECTOMY    . TUBAL LIGATION      SOCIAL HISTORY:   Social History  Substance Use Topics  . Smoking status: Never Smoker  . Smokeless tobacco: Never Used  . Alcohol use Yes      Comment: occasional    FAMILY HISTORY:  History reviewed. No pertinent family history.  DRUG ALLERGIES:   Allergies  Allergen Reactions  . Amoxicillin Nausea And Vomiting  . Azithromycin Nausea And Vomiting  . Ciprofloxacin Nausea And Vomiting    "Cannot take by mouth, OK by IV"  . Sulfa Antibiotics Nausea And Vomiting    REVIEW OF SYSTEMS:   Review of Systems  Constitutional: Positive for chills and malaise/fatigue. Negative for fever.  HENT: Negative for sore throat.   Eyes: Negative for blurred vision, double vision and pain.  Respiratory: Negative for cough, hemoptysis, shortness of breath and wheezing.   Cardiovascular: Negative for chest pain, palpitations, orthopnea and leg swelling.  Gastrointestinal: Positive for abdominal pain, nausea and vomiting. Negative for constipation, diarrhea and heartburn.  Genitourinary: Positive for flank pain. Negative for dysuria and hematuria.  Musculoskeletal: Negative for back pain and joint pain.  Skin: Negative for rash.  Neurological: Positive for weakness. Negative for sensory change, speech change, focal weakness and headaches.  Endo/Heme/Allergies: Does not bruise/bleed easily.  Psychiatric/Behavioral: Negative for depression. The patient is not nervous/anxious.     MEDICATIONS AT HOME:   Prior to Admission medications   Medication Sig Start Date End Date Taking? Authorizing Provider  AIMOVIG 70 MG/ML SOAJ Inject 70 mg into the skin every 28 (twenty-eight) days. 02/20/17  Yes [provider]  aspirin EC 81 MG tablet Take 81 mg  by mouth daily. 02/23/14  Yes [provider]  butalbital-acetaminophen-caffeine (FIORICET, ESGIC) [phone] MG tablet Take 1-2 tablets by mouth every 6 (six) hours as needed for headache. 11/30/16 11/30/17 Yes Willy Eddy, MD  clobetasol ointment (TEMOVATE) 0.05 % Apply 1 application topically 2 (two) times daily as needed. 01/29/17  Yes [provider]  cyclobenzaprine  (FLEXERIL) 10 MG tablet Take 1 tablet (10 mg total) by mouth every 8 (eight) hours as needed for muscle spasms. 04/26/15  Yes Beers, Charmayne Sheer, PA-C  diclofenac (FLECTOR) 1.3 % PTCH Place 1 patch onto the skin 2 (two) times daily.   Yes [provider]  diclofenac sodium (VOLTAREN) 1 % GEL Apply 2 g topically 4 (four) times daily as needed.   Yes [provider]  fluocinolone (VANOS) 0.01 % cream Apply 1 application topically 2 (two) times daily.   Yes [provider]  hydrOXYzine (ATARAX/VISTARIL) 25 MG tablet Take 25 mg by mouth 2 (two) times daily.   Yes [provider]  Hyoscyamine Sulfate SL 0.125 MG SUBL Place 0.125 mg under the tongue every 6 (six) hours as needed.   Yes [provider]  levothyroxine (SYNTHROID, LEVOTHROID) 200 MCG tablet Take 225 mcg by mouth at bedtime.  03/20/16  Yes [provider]  levothyroxine (SYNTHROID, LEVOTHROID) 25 MCG tablet Take 25 mcg by mouth at bedtime.    Yes [provider]  losartan (COZAAR) 25 MG tablet Take 25 mg by mouth daily.   Yes [provider]  mirabegron ER (MYRBETRIQ) 50 MG TB24 tablet Take 50 mg by mouth daily.   Yes [provider]  nortriptyline (PAMELOR) 50 MG capsule Take 100 mg by mouth at bedtime.   Yes [provider]  ondansetron (ZOFRAN ODT) 4 MG disintegrating tablet Allow 1-2 tablets to dissolve in your mouth every 8 hours as needed for nausea/vomiting 08/17/16  Yes Loleta Rose, MD  pentosan polysulfate (ELMIRON) 100 MG capsule Take 100 mg by mouth 3 (three) times daily.   Yes [provider]  pregabalin (LYRICA) 150 MG capsule Take 150 mg by mouth 3 (three) times daily.  04/11/16  Yes [provider]  propranolol (INDERAL) 40 MG tablet Take 40 mg by mouth 2 (two) times daily.   Yes [provider]  ranitidine (ZANTAC) 300 MG tablet Take 300 mg by mouth at bedtime. 03/09/16 03/09/17 Yes [provider]   tiZANidine (ZANAFLEX) 2 MG tablet Take 2 mg by mouth every 8 (eight) hours as needed for muscle spasms. 04/11/16  Yes [provider]  traMADol (ULTRAM) 50 MG tablet Take 1 tablet (50 mg total) by mouth 3 (three) times daily as needed for moderate pain or severe pain. 06/22/16  Yes Enedina Finner, MD  UNABLE TO FIND Med Name: Bladder cocktail - Heparin, Sodium Bicarbonate and Lidocaine.  Inject into catheter as needed for pain.   Yes [provider]  ustekinumab (STELARA) 90 MG/ML SOSY injection Inject 90 mg into the skin every 3 (three) months.   Yes [provider]  opium-belladonna (B&O SUPPRETTES) 16.2-60 MG suppository Place 1 suppository rectally every 8 (eight) hours as needed for bladder spasms. Patient not taking: Reported on 02/25/2017 08/13/16   Adrian Saran, MD  oxybutynin (DITROPAN) 5 MG tablet Take 1 tablet (5 mg total) by mouth 3 (three) times daily as needed for bladder spasms. Patient not taking: Reported on 02/25/2017 08/17/16   Loleta Rose, MD  phenazopyridine (PYRIDIUM) 100 MG tablet Take 1 tablet (100 mg total)  by mouth 3 (three) times daily with meals. Patient not taking: Reported on 02/25/2017 08/12/16   Adrian Saran, MD  promethazine (PHENERGAN) 12.5 MG tablet Take 1 tablet (12.5 mg total) by mouth every 6 (six) hours as needed for nausea or vomiting. Patient not taking: Reported on 02/25/2017 05/30/15   Minna Antis, MD     VITAL SIGNS:  Blood pressure (!) 144/79, pulse 72, temperature 98.2 F (36.8 C), temperature source Oral, resp. rate 18, height  (1.6 m), weight 108.9 kg (240 lb), SpO2 97 %.  PHYSICAL EXAMINATION:  Physical Exam  GENERAL:  56 y.o.-year-old patient lying in the bed with no acute distress. Obese EYES: Pupils equal, round, reactive to light and accommodation. No scleral icterus. Extraocular muscles intact.  HEENT: Head atraumatic, normocephalic. Oropharynx and nasopharynx clear. No oropharyngeal erythema, moist oral  mucosa  NECK:  Supple, no jugular venous distention. No thyroid enlargement, no tenderness.  LUNGS: Normal breath sounds bilaterally, no wheezing, rales, rhonchi. No use of accessory muscles of respiration.  CARDIOVASCULAR: S1, S2 normal. No murmurs, rubs, or gallops.  ABDOMEN: Soft, right abdominal tenderness, nondistended. Bowel sounds present. No organomegaly or mass. Foley catheter in place EXTREMITIES: No pedal edema, cyanosis, or clubbing. + 2 pedal & radial pulses b/l.   NEUROLOGIC: Cranial nerves II through XII are intact. No focal Motor or sensory deficits appreciated b/l PSYCHIATRIC: The patient is alert and oriented x 3. Good affect.  SKIN: No obvious rash, lesion, or ulcer.   LABORATORY PANEL:   CBC  Recent Labs Lab 02/25/17 0132  WBC 11.9*  HGB 12.9  HCT 39.8  PLT 344   ------------------------------------------------------------------------------------------------------------------  Chemistries   Recent Labs Lab 02/25/17 0132  NA 141  K 4.0  CL 103  CO2 24  GLUCOSE 126*  BUN 26*  CREATININE 1.50*  CALCIUM 9.4  AST 18  ALT 9*  ALKPHOS 78  BILITOT 0.7   ------------------------------------------------------------------------------------------------------------------  Cardiac Enzymes No results for input(s): TROPONINI in the last 168 hours. ------------------------------------------------------------------------------------------------------------------  RADIOLOGY:  No results found.   IMPRESSION AND PLAN:   * Right flank pain s/p cysto ureteroscopy 02/23/2017 With UTI  Check renal ultrasound. Start IV ceftriaxone. IV fluids. Pain medications added. Foley catheter in place. Continue.  * Hypertension. Continue home medications. Add IV medications due to being uncontrolled.  * Fibromyalgia. Continue home medications.  All the records are reviewed and case discussed with ED provider. Management plans discussed with the patient, family and they  are in agreement.  CODE STATUS: FULL CODE  TOTAL TIME TAKING CARE OF THIS PATIENT: 40 minutes.   Milagros Loll R M.D on 02/25/2017 at 11:46 AM  Between 7am to 6pm - Pager - [phone]  After 6pm go to [email or web address] - password [email or web address] [facility]  [facility] [place] [facility]  Office  [phone]  CC: Primary care physician; Rayetta Humphrey, MD  Note: This dictation was prepared with Dragon dictation along with smaller phrase technology. Any transcriptional errors that result from this process are unintentional.

## 2017-02-25 NOTE — ED Triage Notes (Signed)
Patient states that she had surgery to have a kidney stone removed yesterday and foley catheter placed. Patient states that about 2 hours ago she started having right lower abdominal pain. Foley catheter draining without difficulty. Patient states that she took Toradol at home with no relief.

## 2017-02-26 LAB — BASIC METABOLIC PANEL
Anion gap: 6 (ref 5–15)
BUN: 25 mg/dL — ABNORMAL HIGH (ref 6–20)
CHLORIDE: 107 mmol/L (ref 101–111)
CO2: 28 mmol/L (ref 22–32)
Calcium: 8.6 mg/dL — ABNORMAL LOW (ref 8.9–10.3)
Creatinine, Ser: 1.19 mg/dL — ABNORMAL HIGH (ref 0.44–1.00)
GFR calc non Af Amer: 50 mL/min — ABNORMAL LOW (ref >60)
GFR, EST AFRICAN AMERICAN: 58 mL/min — AB (ref >60)
Glucose, Bld: 108 mg/dL — ABNORMAL HIGH (ref 65–99)
POTASSIUM: 4.2 mmol/L (ref 3.5–5.1)
SODIUM: 141 mmol/L (ref 135–145)

## 2017-02-26 LAB — CBC
HEMATOCRIT: 35.5 % (ref 35.0–47.0)
HEMOGLOBIN: 11.8 g/dL — AB (ref 12.0–16.0)
MCH: 29 pg (ref 26.0–34.0)
MCHC: 33.4 g/dL (ref 32.0–36.0)
MCV: 86.8 fL (ref 80.0–100.0)
Platelets: 288 10*3/uL (ref 150–440)
RBC: 4.09 MIL/uL (ref 3.80–5.20)
RDW: 13.9 % (ref 11.5–14.5)
WBC: 8.3 10*3/uL (ref 3.6–11.0)

## 2017-02-26 MED ORDER — KETOROLAC TROMETHAMINE 30 MG/ML IJ SOLN
30.0000 mg | Freq: Four times a day (QID) | INTRAMUSCULAR | Status: AC | PRN
  Administered 2017-02-26 – 2017-03-01 (×7): 30 mg via INTRAVENOUS
  Filled 2017-02-26 (×8): qty 1

## 2017-02-26 MED ORDER — OXYCODONE-ACETAMINOPHEN 5-325 MG PO TABS
1.0000 | ORAL_TABLET | ORAL | Status: DC | PRN
  Administered 2017-02-26 – 2017-03-03 (×6): 2 via ORAL
  Filled 2017-02-26 (×7): qty 2

## 2017-02-26 MED ORDER — ENOXAPARIN SODIUM 40 MG/0.4ML ~~LOC~~ SOLN
40.0000 mg | Freq: Two times a day (BID) | SUBCUTANEOUS | Status: DC
  Administered 2017-02-26 – 2017-03-04 (×11): 40 mg via SUBCUTANEOUS
  Filled 2017-02-26 (×13): qty 0.4

## 2017-02-26 NOTE — Progress Notes (Signed)
On call MD Elisabeth Pigeon) called and notified of the following:   Foley removed at 1030am per MD order.  Patient unable to void this shift.  Bladder scan at 1800 resulted in .   Verbal order given to bladder scan qshift. If result is greater than , in and out cath once.   Bo Mcclintock, RN

## 2017-02-26 NOTE — Progress Notes (Signed)
Anticoagulation monitoring(Lovenox):  56 yo  female ordered Lovenox 40 mg Q24h  Filed Weights   02/25/17 0127 02/26/17 0603  Weight: 240 lb (108.9 kg) 259 lb 11.2 oz (117.8 kg)   Body mass index is 46 kg/m.   Lab Results  Component Value Date   CREATININE 1.19 (H) 02/26/2017   CREATININE 1.50 (H) 02/25/2017   CREATININE 1.20 (H) 08/17/2016   Estimated Creatinine Clearance: 64.7 mL/min (A) (by C-G formula based on SCr of 1.19 mg/dL (H)). Hemoglobin & Hematocrit     Component Value Date/Time   HGB 11.8 (L) 02/26/2017 0559   HGB 11.2 (L) 06/16/2014 0456   HCT 35.5 02/26/2017 0559   HCT 35.6 06/16/2014 0456     Per Protocol for Patient with estCrcl > 30 ml/min and BMI > 40, will transition to Lovenox 40 mg Q12h.

## 2017-02-26 NOTE — Progress Notes (Signed)
[employer] - [place] at [facility]   PATIENT NAME: Joanna James    MR#:  [id number]  DATE OF BIRTH:  19-Jul-1960  SUBJECTIVE:  CHIEF COMPLAINT:   Chief Complaint  Patient presents with  . Flank Pain  . Post-op Problem   Still has right flank pain. Afebrile.  REVIEW OF SYSTEMS:    Review of Systems  Constitutional: Positive for malaise/fatigue. Negative for chills and fever.  HENT: Negative for sore throat.   Eyes: Negative for blurred vision, double vision and pain.  Respiratory: Negative for cough, hemoptysis, shortness of breath and wheezing.   Cardiovascular: Negative for chest pain, palpitations, orthopnea and leg swelling.  Gastrointestinal: Negative for abdominal pain, constipation, diarrhea, heartburn, nausea and vomiting.  Genitourinary: Positive for flank pain. Negative for dysuria and hematuria.  Musculoskeletal: Negative for back pain and joint pain.  Skin: Negative for rash.  Neurological: Negative for sensory change, speech change, focal weakness and headaches.  Endo/Heme/Allergies: Does not bruise/bleed easily.  Psychiatric/Behavioral: Negative for depression. The patient is not nervous/anxious.     DRUG ALLERGIES:   Allergies  Allergen Reactions  . Amoxicillin Nausea And Vomiting  . Azithromycin Nausea And Vomiting  . Ciprofloxacin Nausea And Vomiting    "Cannot take by mouth, OK by IV"  . Sulfa Antibiotics Nausea And Vomiting    VITALS:  Blood pressure 99/69, pulse 63, temperature 98 F (36.7 C), temperature source Oral, resp. rate 14, height  (1.6 m), weight 117.8 kg (259 lb 11.2 oz), SpO2 97 %.  PHYSICAL EXAMINATION:   Physical Exam  GENERAL:  56 y.o.-year-old patient lying in the bed with no acute distress.  EYES: Pupils equal, round, reactive to light and accommodation. No scleral icterus. Extraocular muscles intact.  HEENT: Head atraumatic, normocephalic. Oropharynx and nasopharynx clear.  NECK:  Supple, no jugular  venous distention. No thyroid enlargement, no tenderness.  LUNGS: Normal breath sounds bilaterally, no wheezing, rales, rhonchi. No use of accessory muscles of respiration.  CARDIOVASCULAR: S1, S2 normal. No murmurs, rubs, or gallops.  ABDOMEN: Soft. Right flank tenderness. EXTREMITIES: No cyanosis, clubbing or edema b/l.    NEUROLOGIC: Cranial nerves II through XII are intact. No focal Motor or sensory deficits b/l.   PSYCHIATRIC: The patient is alert and oriented x 3.  SKIN: No obvious rash, lesion, or ulcer.   LABORATORY PANEL:   CBC  Recent Labs Lab 02/26/17 0559  WBC 8.3  HGB 11.8*  HCT 35.5  PLT 288   ------------------------------------------------------------------------------------------------------------------ Chemistries   Recent Labs Lab 02/25/17 0132 02/26/17 0559  NA 141 141  K 4.0 4.2  CL 103 107  CO2 24 28  GLUCOSE 126* 108*  BUN 26* 25*  CREATININE 1.50* 1.19*  CALCIUM 9.4 8.6*  AST 18  --   ALT 9*  --   ALKPHOS 78  --   BILITOT 0.7  --    ------------------------------------------------------------------------------------------------------------------  Cardiac Enzymes No results for input(s): TROPONINI in the last 168 hours. ------------------------------------------------------------------------------------------------------------------  RADIOLOGY:  US Renal  Result Date: 02/25/2017 CLINICAL DATA:  Right flank pain. Status post ureteroscopy 3 days ago. EXAM: RENAL / URINARY TRACT ULTRASOUND COMPLETE COMPARISON:  08/17/2016 FINDINGS: Right Kidney: Length: 9.2 cm. Echogenicity within normal limits. No mass or hydronephrosis visualized. Left Kidney: Length: 11.0 cm. Echogenicity within normal limits. No mass or hydronephrosis visualized. Bladder: Decompressed around urinary Foley and therefore poorly visualized. IMPRESSION: No evidence of hydronephrosis or hydroureter. Previously demonstrated bilateral renal calculi are not seen with certainty on  today's  examination. Electronically Signed   By: Ted Mcalpine M.D.   On: 02/25/2017 14:35     ASSESSMENT AND PLAN:   * Right flank pain s/p cysto ureteroscopy 02/23/2017 With UTI  Renal ultrasound shows nothing acute. Patient tells me plan was to remove Foley catheter today per his urologist. DC Foley catheter. Continue IV antibiotics. Await for cultures. Discontinued morphine and add IV Toradol. Change Norco to Percocet. Likely discharge tomorrow.  * Hypertension. Continue home medications. Added IV medications due to being uncontrolled.   * Fibromyalgia. Continue home medications.   All the records are reviewed and case discussed with Care Management/Social Workerr. Management plans discussed with the patient, family and they are in agreement.  CODE STATUS: FULL CODE  DVT Prophylaxis: SCDs  TOTAL TIME TAKING CARE OF THIS PATIENT: 35 minutes.   POSSIBLE D/C IN 1-2 DAYS, DEPENDING ON CLINICAL CONDITION.  Milagros Loll R M.D on 02/26/2017 at 12:11 PM  Between 7am to 6pm - Pager - [phone]  After 6pm go to [email or web address] - password [email or web address] [facility]  [facility] [place] [facility]  Office  [phone]  CC: Primary care physician; Rayetta Humphrey, MD  Note: This dictation was prepared with Dragon dictation along with smaller phrase technology. Any transcriptional errors that result from this process are unintentional.

## 2017-02-27 ENCOUNTER — Inpatient Hospital Stay: Payer: Medicare Other

## 2017-02-27 LAB — URINE CULTURE: Culture: NO GROWTH

## 2017-02-27 MED ORDER — SENNOSIDES-DOCUSATE SODIUM 8.6-50 MG PO TABS
1.0000 | ORAL_TABLET | Freq: Two times a day (BID) | ORAL | Status: DC
  Administered 2017-02-27 – 2017-03-04 (×9): 1 via ORAL
  Filled 2017-02-27 (×11): qty 1

## 2017-02-27 MED ORDER — FLEET ENEMA 7-19 GM/118ML RE ENEM
1.0000 | ENEMA | Freq: Once | RECTAL | Status: AC
  Administered 2017-02-27: 23:00:00 1 via RECTAL

## 2017-02-27 MED ORDER — POLYETHYLENE GLYCOL 3350 17 G PO PACK
17.0000 g | PACK | Freq: Two times a day (BID) | ORAL | Status: DC
  Administered 2017-02-27 – 2017-02-28 (×4): 17 g via ORAL
  Filled 2017-02-27 (×5): qty 1

## 2017-02-27 MED ORDER — BISACODYL 10 MG RE SUPP
10.0000 mg | Freq: Once | RECTAL | Status: AC
  Administered 2017-02-27: 10 mg via RECTAL
  Filled 2017-02-27: qty 1

## 2017-02-27 NOTE — Progress Notes (Signed)
[employer] [facility] - [place] at [facility]   PATIENT NAME: Ivannah Zody    MR#:  [id number]  DATE OF BIRTH:  January 12, 1961  SUBJECTIVE:  CHIEF COMPLAINT:   Chief Complaint  Patient presents with  . Flank Pain  . Post-op Problem   Still has right flank pain. Afebrile. Some tremors today that are chronic  REVIEW OF SYSTEMS:    Review of Systems  Constitutional: Positive for malaise/fatigue. Negative for chills and fever.  HENT: Negative for sore throat.   Eyes: Negative for blurred vision, double vision and pain.  Respiratory: Negative for cough, hemoptysis, shortness of breath and wheezing.   Cardiovascular: Negative for chest pain, palpitations, orthopnea and leg swelling.  Gastrointestinal: Negative for abdominal pain, constipation, diarrhea, heartburn, nausea and vomiting.  Genitourinary: Positive for flank pain. Negative for dysuria and hematuria.  Musculoskeletal: Negative for back pain and joint pain.  Skin: Negative for rash.  Neurological: Negative for sensory change, speech change, focal weakness and headaches.  Endo/Heme/Allergies: Does not bruise/bleed easily.  Psychiatric/Behavioral: Negative for depression. The patient is not nervous/anxious.     DRUG ALLERGIES:   Allergies  Allergen Reactions  . Amoxicillin Nausea And Vomiting  . Azithromycin Nausea And Vomiting  . Ciprofloxacin Nausea And Vomiting    "Cannot take by mouth, OK by IV"  . Sulfa Antibiotics Nausea And Vomiting    VITALS:  Blood pressure 139/78, pulse 83, temperature 98 F (36.7 C), resp. rate 14, height  (1.6 m), weight 119.8 kg (264 lb 1.6 oz), SpO2 99 %.  PHYSICAL EXAMINATION:   Physical Exam  GENERAL:  56 y.o.-year-old patient lying in the bed with no acute distress.  EYES: Pupils equal, round, reactive to light and accommodation. No scleral icterus. Extraocular muscles intact.  HEENT: Head atraumatic, normocephalic. Oropharynx and nasopharynx clear.  NECK:  Supple,  no jugular venous distention. No thyroid enlargement, no tenderness.  LUNGS: Normal breath sounds bilaterally, no wheezing, rales, rhonchi. No use of accessory muscles of respiration.  CARDIOVASCULAR: S1, S2 normal. No murmurs, rubs, or gallops.  ABDOMEN: Soft. Right flank tenderness. EXTREMITIES: No cyanosis, clubbing or edema b/l.    NEUROLOGIC: Cranial nerves II through XII are intact. No focal Motor or sensory deficits b/l.   PSYCHIATRIC: The patient is alert and oriented x 3.  SKIN: No obvious rash, lesion, or ulcer.   LABORATORY PANEL:   CBC  Recent Labs Lab 02/26/17 0559  WBC 8.3  HGB 11.8*  HCT 35.5  PLT 288   ------------------------------------------------------------------------------------------------------------------ Chemistries   Recent Labs Lab 02/25/17 0132 02/26/17 0559  NA 141 141  K 4.0 4.2  CL 103 107  CO2 24 28  GLUCOSE 126* 108*  BUN 26* 25*  CREATININE 1.50* 1.19*  CALCIUM 9.4 8.6*  AST 18  --   ALT 9*  --   ALKPHOS 78  --   BILITOT 0.7  --    ------------------------------------------------------------------------------------------------------------------  Cardiac Enzymes No results for input(s): TROPONINI in the last 168 hours. ------------------------------------------------------------------------------------------------------------------  RADIOLOGY:  US Renal  Result Date: 02/25/2017 CLINICAL DATA:  Right flank pain. Status post ureteroscopy 3 days ago. EXAM: RENAL / URINARY TRACT ULTRASOUND COMPLETE COMPARISON:  08/17/2016 FINDINGS: Right Kidney: Length: 9.2 cm. Echogenicity within normal limits. No mass or hydronephrosis visualized. Left Kidney: Length: 11.0 cm. Echogenicity within normal limits. No mass or hydronephrosis visualized. Bladder: Decompressed around urinary Foley and therefore poorly visualized. IMPRESSION: No evidence of hydronephrosis or hydroureter. Previously demonstrated bilateral renal calculi are not seen with  certainty on today's examination. Electronically Signed   By: Ted Mcalpine M.D.   On: 02/25/2017 14:35     ASSESSMENT AND PLAN:   * Right flank pain s/p cysto ureteroscopy 02/23/2017 With UTI  Renal ultrasound shows nothing acute.  Foley removed 02/26/2017 Continue IV antibiotics. CX NGTD Discontinued morphine and added IV Toradol. Changed Norco to Percocet. Still has significant pain. Will get CT renal stone study.  * Constipation Likely contributing to the pain Dulcolax Suppository  * Hypertension. Continue home medications  Added IV medications due to being uncontrolled.   * Fibromyalgia. Continue home medications. Stop lyrica due to tremors   All the records are reviewed and case discussed with Care Management/Social Workerr. Management plans discussed with the patient, family and they are in agreement.  CODE STATUS: FULL CODE  DVT Prophylaxis: SCDs  TOTAL TIME TAKING CARE OF THIS PATIENT: 35 minutes.   POSSIBLE D/C IN 1-2 DAYS, DEPENDING ON CLINICAL CONDITION.  Milagros Loll R M.D on 02/27/2017 at 11:14 AM  Between 7am to 6pm - Pager - [phone]  After 6pm go to [email or web address] - password [email or web address] [facility]  [facility] [place] [facility]  Office  [phone]  CC: Primary care physician; Rayetta Humphrey, MD  Note: This dictation was prepared with Dragon dictation along with smaller phrase technology. Any transcriptional errors that result from this process are unintentional.

## 2017-02-27 NOTE — Care Management Important Message (Signed)
Important Message  Patient Details  Name: Joanna James MRN: [id number] Date of Birth: [birth year]   Medicare Important Message Given:  Yes    Gwenette Greet, RN 02/27/2017, 8:17 AM

## 2017-02-28 DIAGNOSIS — R1031 Right lower quadrant pain: Secondary | ICD-10-CM

## 2017-02-28 DIAGNOSIS — N3000 Acute cystitis without hematuria: Secondary | ICD-10-CM

## 2017-02-28 DIAGNOSIS — R509 Fever, unspecified: Secondary | ICD-10-CM

## 2017-02-28 DIAGNOSIS — R338 Other retention of urine: Secondary | ICD-10-CM

## 2017-02-28 LAB — LACTIC ACID, PLASMA
LACTIC ACID, VENOUS: 1.1 mmol/L (ref 0.5–1.9)
LACTIC ACID, VENOUS: 1 mmol/L (ref 0.5–1.9)

## 2017-02-28 LAB — BASIC METABOLIC PANEL
ANION GAP: 9 (ref 5–15)
BUN: 21 mg/dL — AB (ref 6–20)
CHLORIDE: 104 mmol/L (ref 101–111)
CO2: 22 mmol/L (ref 22–32)
Calcium: 8.3 mg/dL — ABNORMAL LOW (ref 8.9–10.3)
Creatinine, Ser: 1.03 mg/dL — ABNORMAL HIGH (ref 0.44–1.00)
GFR calc Af Amer: >60 mL/min (ref >60)
GFR, EST NON AFRICAN AMERICAN: 59 mL/min — AB (ref >60)
GLUCOSE: 122 mg/dL — AB (ref 65–99)
POTASSIUM: 4 mmol/L (ref 3.5–5.1)
Sodium: 135 mmol/L (ref 135–145)

## 2017-02-28 LAB — CBC WITH DIFFERENTIAL/PLATELET
BASOS ABS: 0 10*3/uL (ref 0–0.1)
Basophils Relative: 0 %
Eosinophils Absolute: 0 10*3/uL (ref 0–0.7)
Eosinophils Relative: 0 %
HEMATOCRIT: 40.6 % (ref 35.0–47.0)
Hemoglobin: 13.2 g/dL (ref 12.0–16.0)
LYMPHS ABS: 1.2 10*3/uL (ref 1.0–3.6)
LYMPHS PCT: 8 %
MCH: 27.8 pg (ref 26.0–34.0)
MCHC: 32.5 g/dL (ref 32.0–36.0)
MCV: 85.7 fL (ref 80.0–100.0)
MONO ABS: 1.5 10*3/uL — AB (ref 0.2–0.9)
Monocytes Relative: 10 %
NEUTROS ABS: 13 10*3/uL — AB (ref 1.4–6.5)
Neutrophils Relative %: 82 %
Platelets: 351 10*3/uL (ref 150–440)
RBC: 4.74 MIL/uL (ref 3.80–5.20)
RDW: 13.5 % (ref 11.5–14.5)
WBC: 15.7 10*3/uL — ABNORMAL HIGH (ref 3.6–11.0)

## 2017-02-28 MED ORDER — LACTULOSE 10 GM/15ML PO SOLN
30.0000 g | Freq: Four times a day (QID) | ORAL | Status: AC
  Administered 2017-02-28 (×2): 30 g via ORAL
  Filled 2017-02-28 (×2): qty 60

## 2017-02-28 MED ORDER — HYOSCYAMINE SULFATE 0.125 MG PO TBDP
0.1250 mg | ORAL_TABLET | Freq: Two times a day (BID) | ORAL | Status: DC | PRN
  Administered 2017-02-28: 22:00:00 0.125 mg via SUBLINGUAL
  Filled 2017-02-28 (×2): qty 1

## 2017-02-28 MED ORDER — SODIUM CHLORIDE 0.9 % IV BOLUS (SEPSIS)
500.0000 mL | Freq: Once | INTRAVENOUS | Status: AC
  Administered 2017-02-28: 11:00:00 500 mL via INTRAVENOUS

## 2017-02-28 MED ORDER — FLEET ENEMA 7-19 GM/118ML RE ENEM
1.0000 | ENEMA | Freq: Once | RECTAL | Status: AC
  Administered 2017-02-28: 1 via RECTAL

## 2017-02-28 NOTE — Progress Notes (Signed)
[facility] - [place] at [facility]   PATIENT NAME: Joanna KohCassandra James    MR#:  [id number]  DATE OF BIRTH:  11-01-1960  SUBJECTIVE:  CHIEF COMPLAINT:   Chief Complaint  Patient presents with  . Flank Pain  . Post-op Problem   Continues to have right flank pain. Constipated. Nausea. No vomiting. Febrile to 101. Tachycardic in the 120s.  REVIEW OF SYSTEMS:    Review of Systems  Constitutional: Positive for malaise/fatigue. Negative for chills and fever.  HENT: Negative for sore throat.   Eyes: Negative for blurred vision, double vision and pain.  Respiratory: Negative for cough, hemoptysis, shortness of breath and wheezing.   Cardiovascular: Negative for chest pain, palpitations, orthopnea and leg swelling.  Gastrointestinal: Negative for abdominal pain, constipation, diarrhea, heartburn, nausea and vomiting.  Genitourinary: Positive for flank pain. Negative for dysuria and hematuria.  Musculoskeletal: Negative for back pain and joint pain.  Skin: Negative for rash.  Neurological: Negative for sensory change, speech change, focal weakness and headaches.  Endo/Heme/Allergies: Does not bruise/bleed easily.  Psychiatric/Behavioral: Negative for depression. The patient is not nervous/anxious.     DRUG ALLERGIES:   Allergies  Allergen Reactions  . Amoxicillin Nausea And Vomiting  . Azithromycin Nausea And Vomiting  . Ciprofloxacin Nausea And Vomiting    "Cannot take by mouth, OK by IV"  . Sulfa Antibiotics Nausea And Vomiting    VITALS:  Blood pressure 115/76, pulse (!) 124, temperature 99.8 F (37.7 C), temperature source Oral, resp. rate 18, height 5\' 3"  (1.6 m), weight 118.3 kg (260 lb 14.4 oz), SpO2 97 %.  PHYSICAL EXAMINATION:   Physical Exam  GENERAL:  56 y.o.-year-old patient lying in the bed with no acute distress.  EYES: Pupils equal, round, reactive to light and accommodation. No scleral icterus. Extraocular muscles intact.  HEENT: Head  atraumatic, normocephalic. Oropharynx and nasopharynx clear.  NECK:  Supple, no jugular venous distention. No thyroid enlargement, no tenderness.  LUNGS: Normal breath sounds bilaterally, no wheezing, rales, rhonchi. No use of accessory muscles of respiration.  CARDIOVASCULAR: S1, S2 normal. No murmurs, rubs, or gallops.  ABDOMEN: Soft. Right flank tenderness. EXTREMITIES: No cyanosis, clubbing or edema b/l.    NEUROLOGIC: Cranial nerves II through XII are intact. No focal Motor or sensory deficits b/l.   PSYCHIATRIC: The patient is alert and oriented x 3.  SKIN: No obvious rash, lesion, or ulcer.   LABORATORY PANEL:   CBC  Recent Labs Lab 02/28/17 0844  WBC 15.7*  HGB 13.2  HCT 40.6  PLT 351   ------------------------------------------------------------------------------------------------------------------ Chemistries   Recent Labs Lab 02/25/17 0132  02/28/17 0844  NA 141  < > 135  K 4.0  < > 4.0  CL 103  < > 104  CO2 24  < > 22  GLUCOSE 126*  < > 122*  BUN 26*  < > 21*  CREATININE 1.50*  < > 1.03*  CALCIUM 9.4  < > 8.3*  AST 18  --   --   ALT 9*  --   --   ALKPHOS 78  --   --   BILITOT 0.7  --   --   < > = values in this interval not displayed. ------------------------------------------------------------------------------------------------------------------  Cardiac Enzymes No results for input(s): TROPONINI in the last 168 hours. ------------------------------------------------------------------------------------------------------------------  RADIOLOGY:  Ct Renal Stone Study  Result Date: 02/27/2017 CLINICAL DATA:  Persistent right flank pain following cystoureteroscopy on 02/23/2017, initial encounter EXAM: CT ABDOMEN AND PELVIS WITHOUT CONTRAST TECHNIQUE:  Multidetector CT imaging of the abdomen and pelvis was performed following the standard protocol without IV contrast. COMPARISON:  08/10/2016, 02/25/2017 FINDINGS: Lower chest: No acute abnormality.  Hepatobiliary: No focal liver abnormality is seen. No gallstones, gallbladder wall thickening, or biliary dilatation. Pancreas: Unremarkable. No pancreatic ductal dilatation or surrounding inflammatory changes. Spleen: Normal in size without focal abnormality. Adrenals/Urinary Tract: The adrenal glands are within normal limits. Nonobstructing left lower pole renal stones are noted measuring 4 mm in greatest dimension. Mild fullness of the left ureter is noted although tapers normally distally and no ureteral stone is seen. The right kidney also demonstrates some nonobstructing stones measuring 1-2 mm. Fullness of the collecting system and right ureter are noted which extends to the level of the urinary bladder although no definitive obstructing stone is seen. This fullness is increased when compared with the prior exam of 02/25/2017. The bladder is well distended. A tiny focus of air is noted within the bladder likely related to the recent instrumentation. Stomach/Bowel: The appendix has been surgically removed. No obstructive or inflammatory changes are noted within the bowel. Fecal material is noted throughout the colon. No free air is seen. Vascular/Lymphatic: No significant vascular findings are present. No enlarged abdominal or pelvic lymph nodes. Reproductive: Status post hysterectomy. No adnexal masses. Other: No abdominal wall hernia or abnormality. No abdominopelvic ascites. Musculoskeletal: Degenerative changes of the lumbar spine are seen. A pelvic stimulator device is noted on the left. IMPRESSION: Fullness of the collecting systems bilaterally right considerably greater than left although no definitive obstructing stone is seen within the distal ureter. These changes may be related to edema from the recent procedure. They have increased in the interval from the prior ultrasound from 02/25/2017. Bilateral nonobstructing stones as previously seen. No other focal abnormality is noted. Electronically Signed    By: Alcide Clever M.D.   On: 02/27/2017 12:15     ASSESSMENT AND PLAN:   * Sepsis. Patient has tachycardia and leukocytosis. Checked a stat lactic acid which is normal. Bolused normal saline. Check blood cultures. Patient is on IV antibiotics.  * Right flank pain s/p cysto ureteroscopy 02/23/2017 With UTI  Renal ultrasound shows nothing acute.  Foley removed 02/26/2017 Continue IV antibiotics. CX NGTD Still has significant pain. Ct abd showed fullness of ureters and collecting system but no obstruction. Discussed with Dr. Apolinar Junes of urology. We'll place Foley catheter. She will see the patient. Do not expect any acute interventions.  * Constipation Likely contributing to the pain Dulcolax Suppository with a small BM. We'll add oral lactulose to MiraLAX.  * Hypertension. Continue home medications  Added IV medications due to being uncontrolled.   * Fibromyalgia. Continue home medications. Stopped lyrica due to tremors   All the records are reviewed and case discussed with Care Management/Social Workerr. Management plans discussed with the patient, family and they are in agreement.  CODE STATUS: FULL CODE  DVT Prophylaxis: SCDs  TOTAL TIME TAKING CARE OF THIS PATIENT: 35 minutes.   POSSIBLE D/C IN 1-2 DAYS, DEPENDING ON CLINICAL CONDITION.  Milagros Loll R M.D on 02/28/2017 at 11:58 AM  Between 7am to 6pm - Pager - [phone]  After 6pm go to [email or web address] - password [email or web address] [facility]  [facility] [place] [facility]  Office  [phone]  CC: Primary care physician; Rayetta Humphrey, MD  Note: This dictation was prepared with Dragon dictation along with smaller phrase technology. Any transcriptional errors that result from this process are unintentional.

## 2017-02-28 NOTE — Consult Note (Signed)
02/28/2017 12:38 PM   Joanna James 11-Nov-1960 [id number]  Referring provider: Dr. Milagros LollSrikar Sudini  CC: Right flank pain, fever after ureteroscopy  HPI: The patient is a 56 year old female with multiple medical comorbidities including interstitial cystitis, fibromyalgia, history of nephrolithiasis who presented to the hospital with right flank pain and fever. Approximately 5 days ago she underwent cystoscopy, diagnostic right ureteroscopy without stent, hydrodistention, injection of bupivacaine and triamcinolone, bladder instillation with Azo and bupivacaine at [facility] with Dr. Logan BoresEvans. She was discharged home with a foley. One day after undergoing the procedure, she developed severe abdominal and right flank pain for which she presented to the hospital. Renal ultrasound at that time showed a decompressed bladder without hydronephrosis bilaterally. Therefore the catheter was subsequently discontinued due to discomfort. She continued to spike fevers with tachycardia and a CT abdomen pelvis was performed. There was a distended bladder with mild renal fullness right greater than left. Her urine culture at admission was however negative. She continues to spike fevers and complains of right flank pain.  She did undergo intermittent catheterization overnight for a volume greater than 400 cc when she was unable to void.   PMH: Past Medical History:  Diagnosis Date  . Chronic pain syndrome   . Fibromyalgia   . Gout   . Hypertension   . IC (interstitial cystitis)   . Spinal stenosis     Surgical History: Past Surgical History:  Procedure Laterality Date  . ABDOMINAL HYSTERECTOMY    . APPENDECTOMY    . BLADDER SUSPENSION    . CYSTOSCOPY WITH STENT PLACEMENT Left 08/13/2016   Procedure: CYSTOSCOPY WITH STENT PLACEMENT;  Surgeon: Bjorn PippinJohn Wrenn, MD;  Location: [facility] ORS;  Service: Urology;  Laterality: Left;  . ECTOPIC PREGNANCY SURGERY    . meniscus tear    . ROTATOR CUFF  REPAIR    . THYROIDECTOMY    . TUBAL LIGATION      Allergies:  Allergies  Allergen Reactions  . Amoxicillin Nausea And Vomiting  . Azithromycin Nausea And Vomiting  . Ciprofloxacin Nausea And Vomiting    "Cannot take by mouth, OK by IV"  . Sulfa Antibiotics Nausea And Vomiting    Family History: History reviewed. No pertinent family history.  Social History:  reports that she has never smoked. She has never used smokeless tobacco. She reports that she drinks alcohol. She reports that she does not use drugs.  ROS: 12 point ROS negative except for above  Physical Exam: BP 115/76 (BP Location: Left Arm)   Pulse (!) 124   Temp 99.8 F (37.7 C) (Oral)   Resp 18   Ht 5\' 3"  (1.6 m)   Wt 260 lb 14.4 oz (118.3 kg)   SpO2 97%   BMI 46.22 kg/m   Constitutional:  Alert and oriented, No acute distress. HEENT: [place] AT, moist mucus membranes.  Trachea midline, no masses. Cardiovascular: No clubbing, cyanosis, or edema. Respiratory: Normal respiratory effort, no increased work of breathing. GI: Abdomen is soft, nontender, nondistended, no abdominal masses GU: Right CVA tenderness.  Skin: No rashes, bruises or suspicious lesions. Lymph: No cervical or inguinal adenopathy. Neurologic: Grossly intact, no focal deficits, moving all 4 extremities. Psychiatric: Normal mood and affect.  Laboratory Data: Lab Results  Component Value Date   WBC 15.7 (H) 02/28/2017   HGB 13.2 02/28/2017   HCT 40.6 02/28/2017   MCV 85.7 02/28/2017   PLT 351 02/28/2017    Lab Results  Component Value Date   CREATININE 1.03 (  H) 02/28/2017    No results found for: PSA  No results found for: TESTOSTERONE  Lab Results  Component Value Date   HGBA1C 5.5 06/11/2014    Urinalysis    Component Value Date/Time   COLORURINE AMBER (A) 02/25/2017 0132   APPEARANCEUR HAZY (A) 02/25/2017 0132   APPEARANCEUR Hazy 06/10/2014 1702   LABSPEC 1.027 02/25/2017 0132   LABSPEC 1.023 06/10/2014 1702   PHURINE  5.0 02/25/2017 0132   GLUCOSEU NEGATIVE 02/25/2017 0132   GLUCOSEU Negative 06/10/2014 1702   HGBUR LARGE (A) 02/25/2017 0132   BILIRUBINUR NEGATIVE 02/25/2017 0132   BILIRUBINUR Negative 06/10/2014 1702   KETONESUR NEGATIVE 02/25/2017 0132   PROTEINUR 100 (A) 02/25/2017 0132   NITRITE POSITIVE (A) 02/25/2017 0132   LEUKOCYTESUR NEGATIVE 02/25/2017 0132   LEUKOCYTESUR Negative 06/10/2014 1702    Pertinent Imaging: CT and renal u/s reviewed as above   Assessment & Plan:    1. Right greater than left upper tract fullness 2. Right flank pain 3. Fever 4. Interstitial cystitis 5. Urinary retention The patient is in apparent urinary retention at this point which is likely the reason for the new fullness of her upper tracts. Her urine culture was negative from the time of admission, however she may have developed pyelonephritis in the interim as result of her urinary retention and recent upper tract instrumentation. She does however have blood cultures pending, so I would continue antibiotics pending these culture results. I also think she does need a urethral catheter replaced due to urinary retention with resultant mild fullness of her upper tracts. Given her clinical state from retention, she should be discharged home with this catheter when medically clear. She should follow-up with her primary urologist, Dr. Logan Bores at [facility], for trial of void and for further follow-up of her interstitial cystitis after her recent hydrodistention. No further urological intervention is indicated at this time.  Hildred Laser, MD  [facility] [address], [address] [place], [place] [address] [phone]

## 2017-02-28 NOTE — Progress Notes (Signed)
PT Cancellation Note  Patient Details Name: Joanna James MRN: [id number] DOB: 1960/11/01   Cancelled Treatment:    Reason Eval/Treat Not Completed: Other (comment) Chart reviewed, pt refused PT due to being in pain, requested to try again tomorrow. Will re-attempt at next available date.   Renford Dillsrishia Hadassah Rana, SPT Renford Dillsrishia Belicia Difatta 02/28/2017, 2:04 PM

## 2017-03-01 MED ORDER — POLYETHYLENE GLYCOL 3350 17 G PO PACK
17.0000 g | PACK | Freq: Every day | ORAL | Status: DC
  Administered 2017-03-04: 17 g via ORAL
  Filled 2017-03-01 (×3): qty 1

## 2017-03-01 MED ORDER — MORPHINE SULFATE (PF) 2 MG/ML IV SOLN
2.0000 mg | INTRAVENOUS | Status: DC | PRN
  Administered 2017-03-02: 2 mg via INTRAVENOUS
  Filled 2017-03-01 (×2): qty 1

## 2017-03-01 NOTE — Progress Notes (Signed)
PT Cancellation Note  Patient Details Name: Joanna CootsCassandra Philippa James MRN: [id number] DOB: 12/27/1960   Cancelled Treatment:    Reason Eval/Treat Not Completed: Other (comment) Chart reviewed, pt refused PT due to feeling tired secondary to getting up out of bed to use the bathroom multiple times, stated she has not been getting much sleep. Encouraged pt on benefits of mobility, this is second refusal. Will re-attempt in the pm.   Renford Dillsrishia Donzella Carrol, SPT [name] 03/01/2017, 10:20 AM

## 2017-03-01 NOTE — Progress Notes (Signed)
PT Cancellation Note  Patient Details Name: Joanna James MRN: [id number] DOB: 08/01/1960   Cancelled Treatment:    Reason Eval/Treat Not Completed: Other (comment). Pt continues to refuse therapy services. Will make 1 last attempt next date. If continued refusal, will discontinue order.   Hani Campusano 03/01/2017, 2:41 PM Elizabeth PalauStephanie Payslee Bateson, PT, DPT [phone]

## 2017-03-01 NOTE — Progress Notes (Signed)
[facility] - [place] at [facility]   PATIENT NAME: Joanna James    MR#:  [id number]  DATE OF BIRTH:  Dec 24, 1960  SUBJECTIVE:  CHIEF COMPLAINT:   Chief Complaint  Patient presents with  . Flank Pain  . Post-op Problem   Continues to have right flank pain. Constipated. Nausea. No vomiting. Febrile to 101. Tachycardic in the 120s.  REVIEW OF SYSTEMS:    Review of Systems  Constitutional: Positive for malaise/fatigue. Negative for chills and fever.  HENT: Negative for sore throat.   Eyes: Negative for blurred vision, double vision and pain.  Respiratory: Negative for cough, hemoptysis, shortness of breath and wheezing.   Cardiovascular: Negative for chest pain, palpitations, orthopnea and leg swelling.  Gastrointestinal: Negative for abdominal pain, constipation, diarrhea, heartburn, nausea and vomiting.  Genitourinary: Positive for flank pain. Negative for dysuria and hematuria.  Musculoskeletal: Negative for back pain and joint pain.  Skin: Negative for rash.  Neurological: Negative for sensory change, speech change, focal weakness and headaches.  Endo/Heme/Allergies: Does not bruise/bleed easily.  Psychiatric/Behavioral: Negative for depression. The patient is not nervous/anxious.     DRUG ALLERGIES:   Allergies  Allergen Reactions  . Amoxicillin Nausea And Vomiting  . Azithromycin Nausea And Vomiting  . Ciprofloxacin Nausea And Vomiting    "Cannot take by mouth, OK by IV"  . Sulfa Antibiotics Nausea And Vomiting    VITALS:  Blood pressure 102/61, pulse (!) 104, temperature 98.9 F (37.2 C), temperature source Oral, resp. rate 15, height 5\' 3"  (1.6 m), weight 118.1 kg (260 lb 6.4 oz), SpO2 95 %.  PHYSICAL EXAMINATION:   Physical Exam  GENERAL:  56 y.o.-year-old patient lying in the bed with no acute distress.  EYES: Pupils equal, round, reactive to light and accommodation. No scleral icterus. Extraocular muscles intact.  HEENT: Head  atraumatic, normocephalic. Oropharynx and nasopharynx clear.  NECK:  Supple, no jugular venous distention. No thyroid enlargement, no tenderness.  LUNGS: Normal breath sounds bilaterally, no wheezing, rales, rhonchi. No use of accessory muscles of respiration.  CARDIOVASCULAR: S1, S2 normal. No murmurs, rubs, or gallops.  ABDOMEN: Soft. Right flank tenderness. EXTREMITIES: No cyanosis, clubbing or edema b/l.    NEUROLOGIC: Cranial nerves II through XII are intact. No focal Motor or sensory deficits b/l.   PSYCHIATRIC: The patient is alert and oriented x 3.  SKIN: No obvious rash, lesion, or ulcer.   LABORATORY PANEL:   CBC  Recent Labs Lab 02/28/17 0844  WBC 15.7*  HGB 13.2  HCT 40.6  PLT 351   ------------------------------------------------------------------------------------------------------------------ Chemistries   Recent Labs Lab 02/25/17 0132  02/28/17 0844  NA 141  < > 135  K 4.0  < > 4.0  CL 103  < > 104  CO2 24  < > 22  GLUCOSE 126*  < > 122*  BUN 26*  < > 21*  CREATININE 1.50*  < > 1.03*  CALCIUM 9.4  < > 8.3*  AST 18  --   --   ALT 9*  --   --   ALKPHOS 78  --   --   BILITOT 0.7  --   --   < > = values in this interval not displayed. ------------------------------------------------------------------------------------------------------------------  Cardiac Enzymes No results for input(s): TROPONINI in the last 168 hours. ------------------------------------------------------------------------------------------------------------------  RADIOLOGY:  No results found.   ASSESSMENT AND PLAN:   * Sepsis. Patient had tachycardia and leukocytosis. Checked a stat lactic acid which is normal. Bolused normal saline. Check  blood cultures. Patient is on IV antibiotics.  * Right flank pain s/p cysto ureteroscopy 02/23/2017 With UTI  Renal ultrasound shows nothing acute.  Foley removed 02/26/2017 and had to be resinserted 02/28/2017 due to urinary  retention Continue IV antibiotics. CX NGTD Ct abd showed fullness of ureters and collecting system but no obstruction before foley placed Repeat CBC tomorrow. IF WBC trending down  * Constipation - resolved  * Hypertension. Continue home medications  Added IV medications due to being uncontrolled.   * Fibromyalgia. Continue home medications. Stopped lyrica due to tremors   All the records are reviewed and case discussed with Care Management/Social Workerr. Management plans discussed with the patient, family and they are in agreement.  CODE STATUS: FULL CODE  DVT Prophylaxis: SCDs  TOTAL TIME TAKING CARE OF THIS PATIENT: 35 minutes.   Likely d/c tomorrow  Milagros Loll R M.D on 03/01/2017 at 12:05 PM  Between 7am to 6pm - Pager - [phone]  After 6pm go to [email or web address] - password [email or web address] [facility]  [facility] [place] [facility]  Office  [phone]  CC: Primary care physician; Rayetta Humphrey, MD  Note: This dictation was prepared with Dragon dictation along with smaller phrase technology. Any transcriptional errors that result from this process are unintentional.

## 2017-03-02 LAB — CBC WITH DIFFERENTIAL/PLATELET
BASOS ABS: 0 10*3/uL (ref 0–0.1)
BASOS PCT: 0 %
Eosinophils Absolute: 0.2 10*3/uL (ref 0–0.7)
Eosinophils Relative: 2 %
HEMATOCRIT: 33 % — AB (ref 35.0–47.0)
Hemoglobin: 10.8 g/dL — ABNORMAL LOW (ref 12.0–16.0)
Lymphocytes Relative: 8 %
Lymphs Abs: 1.1 10*3/uL (ref 1.0–3.6)
MCH: 28.6 pg (ref 26.0–34.0)
MCHC: 32.7 g/dL (ref 32.0–36.0)
MCV: 87.3 fL (ref 80.0–100.0)
MONO ABS: 0.9 10*3/uL (ref 0.2–0.9)
Monocytes Relative: 6 %
NEUTROS ABS: 11.3 10*3/uL — AB (ref 1.4–6.5)
Neutrophils Relative %: 84 %
Platelets: 267 10*3/uL (ref 150–440)
RBC: 3.78 MIL/uL — ABNORMAL LOW (ref 3.80–5.20)
RDW: 13.9 % (ref 11.5–14.5)
WBC: 13.6 10*3/uL — ABNORMAL HIGH (ref 3.6–11.0)

## 2017-03-02 MED ORDER — LOSARTAN POTASSIUM 25 MG PO TABS
25.0000 mg | ORAL_TABLET | Freq: Every day | ORAL | Status: DC
  Administered 2017-03-03 – 2017-03-05 (×3): 25 mg via ORAL
  Filled 2017-03-02 (×3): qty 1

## 2017-03-02 MED ORDER — OXYCODONE-ACETAMINOPHEN 5-325 MG PO TABS: 1.0000 | ORAL_TABLET | ORAL | 0 refills | Status: DC | PRN

## 2017-03-02 MED ORDER — PHENAZOPYRIDINE HCL 100 MG PO TABS: 100.0000 mg | ORAL_TABLET | Freq: Three times a day (TID) | ORAL | 0 refills | Status: DC

## 2017-03-02 MED ORDER — PROPRANOLOL HCL 40 MG PO TABS
40.0000 mg | ORAL_TABLET | Freq: Two times a day (BID) | ORAL | Status: DC
  Filled 2017-03-02: qty 1

## 2017-03-02 NOTE — Progress Notes (Signed)
PT Cancellation Note  Patient Details Name: Joanna James MRN: [id number] DOB: [birth year]   Cancelled Treatment:    Reason Eval/Treat Not Completed: Other (comment) Pt continues to refuse therapy services, this was 4th attempt. Will discontinue order.   Renford Dillsrishia Khaylee Mcevoy, SPT Renford Dillsrishia Airi Copado 03/02/2017, 1:47 PM

## 2017-03-03 MED ORDER — HYOSCYAMINE SULFATE 0.125 MG PO TBDP
0.1250 mg | ORAL_TABLET | Freq: Four times a day (QID) | ORAL | Status: DC | PRN
  Administered 2017-03-03 – 2017-03-04 (×3): 0.125 mg via SUBLINGUAL
  Filled 2017-03-03 (×4): qty 1

## 2017-03-03 NOTE — Progress Notes (Signed)
[facility] - [place] at [facility]   PATIENT NAME: Joanna James    MR#:  [id number]  DATE OF BIRTH:  1960-06-13  SUBJECTIVE: complains of abdominal cramps, poor by mouth intake and does not want to eat.  CHIEF COMPLAINT:   Chief Complaint  Patient presents with  . Flank Pain  . Post-op Problem   Continues to have right flank pain. Constipation resolved. Afebrile now  REVIEW OF SYSTEMS:    Review of Systems  Constitutional: Positive for malaise/fatigue. Negative for chills and fever.  HENT: Negative for sore throat.   Eyes: Negative for blurred vision, double vision and pain.  Respiratory: Negative for cough, hemoptysis, shortness of breath and wheezing.   Cardiovascular: Negative for chest pain, palpitations, orthopnea and leg swelling.  Gastrointestinal: Negative for abdominal pain, constipation, diarrhea, heartburn, nausea and vomiting.  Genitourinary: Positive for flank pain. Negative for dysuria and hematuria.  Musculoskeletal: Negative for back pain and joint pain.  Skin: Negative for rash.  Neurological: Negative for sensory change, speech change, focal weakness and headaches.  Endo/Heme/Allergies: Does not bruise/bleed easily.  Psychiatric/Behavioral: Negative for depression. The patient is not nervous/anxious.     DRUG ALLERGIES:   Allergies  Allergen Reactions  . Amoxicillin Nausea And Vomiting  . Azithromycin Nausea And Vomiting  . Ciprofloxacin Nausea And Vomiting    "Cannot take by mouth, OK by IV"  . Sulfa Antibiotics Nausea And Vomiting    VITALS:  Blood pressure (!) 141/78, pulse 86, temperature 98 F (36.7 C), resp. rate 18, height 5\' 2"  (1.575 m), weight 121.6 kg (268 lb 1.6 oz), SpO2 95 %.  PHYSICAL EXAMINATION:   Physical Exam  GENERAL:  56 y.o.-year-old patient lying in the bed with no acute distress. sTill poor by mouth intake, continued on IV fluids since 14th of October. EYES: Pupils equal, round, reactive to light  and accommodation. No scleral icterus. Extraocular muscles intact.  HEENT: Head atraumatic, normocephalic. Oropharynx and nasopharynx clear.  NECK:  Supple, no jugular venous distention. No thyroid enlargement, no tenderness.  LUNGS: Normal breath sounds bilaterally, no wheezing, rales, rhonchi. No use of accessory muscles of respiration.  CARDIOVASCULAR: S1, S2 normal. No murmurs, rubs, or gallops.  ABDOMEN: Soft. Right flank tenderness. EXTREMITIES: No cyanosis, clubbing or edema b/l.    NEUROLOGIC: Cranial nerves II through XII are intact. No focal Motor or sensory deficits b/l.   PSYCHIATRIC: The patient is alert and oriented x 3.  SKIN: No obvious rash, lesion, or ulcer.   LABORATORY PANEL:   CBC  Recent Labs Lab 03/02/17 0559  WBC 13.6*  HGB 10.8*  HCT 33.0*  PLT 267   ------------------------------------------------------------------------------------------------------------------ Chemistries   Recent Labs Lab 02/25/17 0132  02/28/17 0844  NA 141  < > 135  K 4.0  < > 4.0  CL 103  < > 104  CO2 24  < > 22  GLUCOSE 126*  < > 122*  BUN 26*  < > 21*  CREATININE 1.50*  < > 1.03*  CALCIUM 9.4  < > 8.3*  AST 18  --   --   ALT 9*  --   --   ALKPHOS 78  --   --   BILITOT 0.7  --   --   < > = values in this interval not displayed. ------------------------------------------------------------------------------------------------------------------  Cardiac Enzymes No results for input(s): TROPONINI in the last 168 hours. ------------------------------------------------------------------------------------------------------------------  RADIOLOGY:  No results found.   ASSESSMENT AND PLAN:   * Sepsis. Resolved  *  Right flank pain s/p cysto ureteroscopy 02/23/2017 With UTI  Renal ultrasound shows nothing acute.  Foley removed 02/26/2017 and had to be resinserted 02/28/2017 due to urinary retention CX NGTD Ct abd showed fullness of ureters and collecting system but no  obstruction before foley placed Foley replaced. Stop IV abx t.patient is to discharge home with Foley, follow-up with urologist as outpatient.  * Constipation - resolved  * Hypertension. Continue home medications Takes on Losartan at home.   * Fibromyalgia. Continue home medications. Stopped lyrica due to tremors Abdominal cramps, IBD;: Continue Levisin. encourage by mouth intake, decrease IV fluids, likely discharge tomorrow. Patient still on IV fluids and not eating at all.  All the records are reviewed and case discussed with Care Management/Social Workerr. Management plans discussed with the patient, family and they are in agreement.  CODE STATUS: FULL CODE  DVT Prophylaxis: SCDs  TOTAL TIME TAKING CARE OF THIS PATIENT: 35 minutes.     Katha HammingKONIDENA,[name] M.D on 03/03/2017 at 8:59 AM  Between 7am to 6pm - Pager - [phone]  After 6pm go to [email or web address] - password [email or web address] [email or web address]  [facility] [place] [facility]  Office  [phone]  CC: Primary care physician; Rayetta HumphreyGeorge, Sionne A, MD  Note: This dictation was prepared with Dragon dictation along with smaller phrase technology. Any transcriptional errors that result from this process are unintentional.

## 2017-03-03 NOTE — Progress Notes (Signed)
[facility] - [place] at [facility]   PATIENT NAME: Joanna James    MR#:  [id number]  DATE OF BIRTH:  06/15/1960  SUBJECTIVE:  CHIEF COMPLAINT:   Chief Complaint  Patient presents with  . Flank Pain  . Post-op Problem   Continues to have right flank pain. Constipation resolved. Afebrile now  REVIEW OF SYSTEMS:    Review of Systems  Constitutional: Positive for malaise/fatigue. Negative for chills and fever.  HENT: Negative for sore throat.   Eyes: Negative for blurred vision, double vision and pain.  Respiratory: Negative for cough, hemoptysis, shortness of breath and wheezing.   Cardiovascular: Negative for chest pain, palpitations, orthopnea and leg swelling.  Gastrointestinal: Negative for abdominal pain, constipation, diarrhea, heartburn, nausea and vomiting.  Genitourinary: Positive for flank pain. Negative for dysuria and hematuria.  Musculoskeletal: Negative for back pain and joint pain.  Skin: Negative for rash.  Neurological: Negative for sensory change, speech change, focal weakness and headaches.  Endo/Heme/Allergies: Does not bruise/bleed easily.  Psychiatric/Behavioral: Negative for depression. The patient is not nervous/anxious.     DRUG ALLERGIES:   Allergies  Allergen Reactions  . Amoxicillin Nausea And Vomiting  . Azithromycin Nausea And Vomiting  . Ciprofloxacin Nausea And Vomiting    "Cannot take by mouth, OK by IV"  . Sulfa Antibiotics Nausea And Vomiting    VITALS:  Blood pressure 106/63, pulse 94, temperature 98 F (36.7 C), resp. rate 20, height 5\' 2"  (1.575 m), weight 121.6 kg (268 lb 1.6 oz), SpO2 96 %.  PHYSICAL EXAMINATION:   Physical Exam  GENERAL:  56 y.o.-year-old patient lying in the bed with no acute distress.  EYES: Pupils equal, round, reactive to light and accommodation. No scleral icterus. Extraocular muscles intact.  HEENT: Head atraumatic, normocephalic. Oropharynx and nasopharynx clear.  NECK:   Supple, no jugular venous distention. No thyroid enlargement, no tenderness.  LUNGS: Normal breath sounds bilaterally, no wheezing, rales, rhonchi. No use of accessory muscles of respiration.  CARDIOVASCULAR: S1, S2 normal. No murmurs, rubs, or gallops.  ABDOMEN: Soft. Right flank tenderness. EXTREMITIES: No cyanosis, clubbing or edema b/l.    NEUROLOGIC: Cranial nerves II through XII are intact. No focal Motor or sensory deficits b/l.   PSYCHIATRIC: The patient is alert and oriented x 3.  SKIN: No obvious rash, lesion, or ulcer.   LABORATORY PANEL:   CBC  Recent Labs Lab 03/02/17 0559  WBC 13.6*  HGB 10.8*  HCT 33.0*  PLT 267   ------------------------------------------------------------------------------------------------------------------ Chemistries   Recent Labs Lab 02/25/17 0132  02/28/17 0844  NA 141  < > 135  K 4.0  < > 4.0  CL 103  < > 104  CO2 24  < > 22  GLUCOSE 126*  < > 122*  BUN 26*  < > 21*  CREATININE 1.50*  < > 1.03*  CALCIUM 9.4  < > 8.3*  AST 18  --   --   ALT 9*  --   --   ALKPHOS 78  --   --   BILITOT 0.7  --   --   < > = values in this interval not displayed. ------------------------------------------------------------------------------------------------------------------  Cardiac Enzymes No results for input(s): TROPONINI in the last 168 hours. ------------------------------------------------------------------------------------------------------------------  RADIOLOGY:  No results found.   ASSESSMENT AND PLAN:   * Sepsis. Resolved  * Right flank pain s/p cysto ureteroscopy 02/23/2017 With UTI  Renal ultrasound shows nothing acute.  Foley removed 02/26/2017 and had to be resinserted 02/28/2017  due to urinary retention CX NGTD Ct abd showed fullness of ureters and collecting system but no obstruction before foley placed Foley replaced. Stop IV abx today  * Constipation - resolved  * Hypertension. Continue home medications Takes  on Losartan at home. DC propranolol  * Fibromyalgia. Continue home medications. Stopped lyrica due to tremors   All the records are reviewed and case discussed with Care Management/Social Workerr. Management plans discussed with the patient, family and they are in agreement.  CODE STATUS: FULL CODE  DVT Prophylaxis: SCDs  TOTAL TIME TAKING CARE OF THIS PATIENT: 35 minutes.   DC home tomorrow to follow with Urology as OP  Milagros Loll R M.D on 03/03/2017 at 8:12 AM  Between 7am to 6pm - Pager - [phone]  After 6pm go to [email or web address] - password [email or web address] [facility]  [facility] [place] [facility]  Office  [phone]  CC: Primary care physician; Rayetta Humphrey, MD  Note: This dictation was prepared with Dragon dictation along with smaller phrase technology. Any transcriptional errors that result from this process are unintentional.

## 2017-03-04 LAB — CREATININE, SERUM
CREATININE: 1.11 mg/dL — AB (ref 0.44–1.00)
GFR, EST NON AFRICAN AMERICAN: 54 mL/min — AB (ref >60)

## 2017-03-04 MED ORDER — BISACODYL 5 MG PO TBEC
5.0000 mg | DELAYED_RELEASE_TABLET | Freq: Every day | ORAL | Status: DC | PRN
  Administered 2017-03-04: 5 mg via ORAL
  Filled 2017-03-04: qty 1

## 2017-03-04 NOTE — Progress Notes (Signed)
When this RN assumed care pnt was resting in bed denied pain or discomfort. Pnt appeared comfortable and asleep on rounds. Bed low and locked with call bell reach. Will continue monitor and assess.

## 2017-03-04 NOTE — Progress Notes (Signed)
[facility] - [place] at [facility]   PATIENT NAME: Joanna James    MR#:  [id number]  DATE OF BIRTH:  August 26, 1960  SUBJECTIVE: constipated, no BM for 3 days. She has abdominal cramps, poor by mouth intake and she does not want to go home and she says she is not ready yet.  CHIEF COMPLAINT:   Chief Complaint  Patient presents with  . Flank Pain  . Post-op Problem     REVIEW OF SYSTEMS:    Review of Systems  Constitutional: Positive for malaise/fatigue. Negative for chills and fever.  HENT: Negative for sore throat.   Eyes: Negative for blurred vision, double vision and pain.  Respiratory: Negative for cough, hemoptysis, shortness of breath and wheezing.   Cardiovascular: Negative for chest pain, palpitations, orthopnea and leg swelling.  Gastrointestinal: Positive for abdominal pain and nausea. Negative for constipation, diarrhea, heartburn and vomiting.  Genitourinary: Positive for flank pain. Negative for dysuria and hematuria.  Musculoskeletal: Negative for back pain and joint pain.  Skin: Negative for rash.  Neurological: Negative for sensory change, speech change, focal weakness and headaches.  Endo/Heme/Allergies: Does not bruise/bleed easily.  Psychiatric/Behavioral: Negative for depression. The patient is not nervous/anxious.     DRUG ALLERGIES:   Allergies  Allergen Reactions  . Amoxicillin Nausea And Vomiting  . Azithromycin Nausea And Vomiting  . Ciprofloxacin Nausea And Vomiting    "Cannot take by mouth, OK by IV"  . Sulfa Antibiotics Nausea And Vomiting    VITALS:  Blood pressure (!) 151/77, pulse 93, temperature 98.5 F (36.9 C), temperature source Oral, resp. rate 18, height 5\' 2"  (1.575 m), weight 121.6 kg (268 lb 1.6 oz), SpO2 93 %.  PHYSICAL EXAMINATION:   Physical Exam  GENERAL:  56 y.o.-year-old patient lying in the bed with no acute distress. sTill poor by mouth intake, continued on IV fluids since 14th of October. EYES:  Pupils equal, round, reactive to light and accommodation. No scleral icterus. Extraocular muscles intact.  HEENT: Head atraumatic, normocephalic. Oropharynx and nasopharynx clear.  NECK:  Supple, no jugular venous distention. No thyroid enlargement, no tenderness.  LUNGS: Normal breath sounds bilaterally, no wheezing, rales, rhonchi. No use of accessory muscles of respiration.  CARDIOVASCULAR: S1, S2 normal. No murmurs, rubs, or gallops.  ABDOMEN: Soft. Right flank tenderness.bowel sounds present but diffuse tenderness present EXTREMITIES: No cyanosis, clubbing or edema b/l.    NEUROLOGIC: Cranial nerves II through XII are intact. No focal Motor or sensory deficits b/l.   PSYCHIATRIC: The patient is alert and oriented x 3.  SKIN: No obvious rash, lesion, or ulcer.   LABORATORY PANEL:   CBC  Recent Labs Lab 03/02/17 0559  WBC 13.6*  HGB 10.8*  HCT 33.0*  PLT 267   ------------------------------------------------------------------------------------------------------------------ Chemistries   Recent Labs Lab 02/28/17 0844 03/04/17 0509  NA 135  --   K 4.0  --   CL 104  --   CO2 22  --   GLUCOSE 122*  --   BUN 21*  --   CREATININE 1.03* 1.11*  CALCIUM 8.3*  --    ------------------------------------------------------------------------------------------------------------------  Cardiac Enzymes No results for input(s): TROPONINI in the last 168 hours. ------------------------------------------------------------------------------------------------------------------  RADIOLOGY:  No results found.   ASSESSMENT AND PLAN:   * Sepsis. Resolved  * Right flank pain s/p cysto ureteroscopy 02/23/2017 With UTI  Renal ultrasound shows nothing acute.  Foley removed 02/26/2017 and had to be resinserted 02/28/2017 due to urinary retention CX NGTD Ct abd showed  fullness of ureters and collecting system but no obstruction before foley placed Foley replaced. Stop IV abx t.patient  is to discharge home with Foley, follow-up with urologist as outpatient. However the patient still has abdominal cramps, poor by mouth intake, constipation 'continue stool softeners, IV fluids,discharge home tomorrow.  * Constipation - resolved  * Hypertension. Continue home medications Takes on Losartan at home.   * Fibromyalgia. Continue home medications. Stopped lyrica due to tremors Abdominal cramps, IBD;: Continue Levisin. encourage by mouth intake, decrease IV fluids, likely discharge tomorrow. Patient still on IV fluids and not eating at all.  All the records are reviewed and case discussed with Care Management/Social Workerr. Management plans discussed with the patient, family and they are in agreement.  CODE STATUS: FULL CODE  DVT Prophylaxis: SCDs  TOTAL TIME TAKING CARE OF THIS PATIENT: 35 minutes.     Katha HammingKONIDENA,Joanna James M.D on 03/04/2017 at 10:10 AM  Between 7am to 6pm - Pager - [phone]  After 6pm go to [email or web address] - password [email or web address]  [facility]  Office  [phone]  CC: Primary care physician; Joanna James, Joanna A, MD  Note: This dictation was prepared with Dragon dictation along with smaller phrase technology. Any transcriptional errors that result from this process are unintentional.

## 2017-03-05 LAB — CULTURE, BLOOD (ROUTINE X 2)
CULTURE: NO GROWTH
Culture: NO GROWTH
Special Requests: ADEQUATE
Specimen Description: ADEQUATE

## 2017-03-05 MED ORDER — HYOSCYAMINE SULFATE 0.125 MG PO TBDP: 0.1250 mg | ORAL_TABLET | Freq: Four times a day (QID) | ORAL | 0 refills | Status: AC | PRN

## 2017-03-05 MED ORDER — OXYCODONE-ACETAMINOPHEN 5-325 MG PO TABS: 1.0000 | ORAL_TABLET | Freq: Four times a day (QID) | ORAL | 0 refills | Status: DC | PRN

## 2017-03-05 MED ORDER — ACETAMINOPHEN 325 MG PO TABS: 650.0000 mg | ORAL_TABLET | Freq: Four times a day (QID) | ORAL | Status: DC | PRN

## 2017-03-05 NOTE — Progress Notes (Signed)
Received MD order to discharge patient to home, reviewed home meds, prescriptions follow up appointments and foley care with patient and patient verbalized understanding discharged to home

## 2017-03-05 NOTE — Discharge Instructions (Signed)
Follow-up with primary care physician in 4-5 days Continue Foley catheter with leg bag Follow-up with the urologist as an outpatient on Thursday as scheduled before

## 2017-03-05 NOTE — Discharge Summary (Signed)
[facility] [place] at [facility]   PATIENT NAME: Joanna James    MR#:  [id number]  DATE OF BIRTH:  10/30/1960  DATE OF ADMISSION:  02/25/2017 ADMITTING PHYSICIAN: Milagros Loll, MD  DATE OF DISCHARGE:  03/05/17  PRIMARY CARE PHYSICIAN: Rayetta Humphrey, MD    ADMISSION DIAGNOSIS:  Lower urinary tract infectious disease [N39.0] Post-operative pain [G89.18] Right flank pain [R10.9]  DISCHARGE DIAGNOSIS:  Active Problems:   UTI (urinary tract infection)   SECONDARY DIAGNOSIS:   Past Medical History:  Diagnosis Date  . Chronic pain syndrome   . Fibromyalgia   . Gout   . Hypertension   . IC (interstitial cystitis)   . Spinal stenosis     HOSPITAL COURSE:   Joanna James  is a 55 y.o. female with a known history of Ureteral stones, fibromyalgia, chronic pain who was admitted to [facility] on 02/23/2017 for. Cystourethroscopy,  Right retrograde pyelogram ,Right diagnostic ureteroscopy .  Hydrodistention of the bladder under general anesthesia, Periurethral trigger point injection of bupivacaine and triamcinolone for myalgia and  Bladder installation with phenazopyridine and bupivacaine.  Presents to the [facility] emergency room with right flank pain and not feeling well. Here patient has received multiple doses of IV pain medications including fentanyl, Toradol, Dilaudid and continues to have pain and has been found to have UTI and is being admitted to the hospital.  * Sepsis. Resolved Blood cultures from 02/28/2017 are negative for 5 days, urine culture is collected on 02/25/2017 is negative with no growth  * Right flank pain s/p cysto ureteroscopy 02/23/2017 With UTI  Renal ultrasound shows nothing acute.  Foley removed 02/26/2017 and had to be resinserted 02/28/2017 due to urinary retention CX NGTD Ct abd showed fullness of ureters and collecting system but no obstruction before foley placed Foley replaced. Stop IV abx  t.patient is to discharge home with Foley, follow-up with urologist as outpatient.   * Constipation - resolved  abdominal cramps 2/2constipation , improved with stool softeners, had 2  large BM    * Hypertension. Continue home medications Takes on Losartan at home.   * Fibromyalgia. Continue home medications. Stopped lyrica due to tremors  Abdominal cramps, IBD;: Continue Levisin. encourage by mouth intake   DISCHARGE CONDITIONS:   FAIR  CONSULTS OBTAINED:  Treatment Team:  Vanna Scotland, MD   PROCEDURES  NONE   DRUG ALLERGIES:   Allergies  Allergen Reactions  . Amoxicillin Nausea And Vomiting  . Azithromycin Nausea And Vomiting  . Ciprofloxacin Nausea And Vomiting    "Cannot take by mouth, OK by IV"  . Sulfa Antibiotics Nausea And Vomiting    DISCHARGE MEDICATIONS:   Current Discharge Medication List    START taking these medications   Details  acetaminophen (TYLENOL) 325 MG tablet Take 2 tablets (650 mg total) by mouth every 6 (six) hours as needed for mild pain (or Fever >/= 101).    hyoscyamine (ANASPAZ) 0.125 MG TBDP disintergrating tablet Place 1 tablet (0.125 mg total) under the tongue every 6 (six) hours as needed for cramping. Qty: 20 tablet, Refills: 0    oxyCODONE-acetaminophen (PERCOCET/ROXICET) 5-325 MG tablet Take 1 tablet by mouth every 6 (six) hours as needed for severe pain. Qty: 20 tablet, Refills: 0      CONTINUE these medications which have CHANGED   Details  phenazopyridine (PYRIDIUM) 100 MG tablet Take 1 tablet (100 mg total) by mouth 3 (three) times daily with meals. Qty: 12 tablet, Refills: 0  CONTINUE these medications which have NOT CHANGED   Details  AIMOVIG 70 MG/ML SOAJ Inject 70 mg into the skin every 28 (twenty-eight) days. Refills: 5    aspirin EC 81 MG tablet Take 81 mg by mouth daily.    butalbital-acetaminophen-caffeine (FIORICET, ESGIC) 50-325-40 MG tablet Take 1-2 tablets by mouth every 6 (six) hours as  needed for headache. Qty: 12 tablet, Refills: 0    clobetasol ointment (TEMOVATE) 0.05 % Apply 1 application topically 2 (two) times daily as needed. Refills: 2    cyclobenzaprine (FLEXERIL) 10 MG tablet Take 1 tablet (10 mg total) by mouth every 8 (eight) hours as needed for muscle spasms. Qty: 30 tablet, Refills: 1    diclofenac (FLECTOR) 1.3 % PTCH Place 1 patch onto the skin 2 (two) times daily.    diclofenac sodium (VOLTAREN) 1 % GEL Apply 2 g topically 4 (four) times daily as needed.    fluocinolone (VANOS) 0.01 % cream Apply 1 application topically 2 (two) times daily.    hydrOXYzine (ATARAX/VISTARIL) 25 MG tablet Take 25 mg by mouth 2 (two) times daily.    Hyoscyamine Sulfate SL 0.125 MG SUBL Place 0.125 mg under the tongue every 6 (six) hours as needed.    !! levothyroxine (SYNTHROID, LEVOTHROID) 200 MCG tablet Take 225 mcg by mouth at bedtime.     !! levothyroxine (SYNTHROID, LEVOTHROID) 25 MCG tablet Take 25 mcg by mouth at bedtime.     losartan (COZAAR) 25 MG tablet Take 25 mg by mouth daily.    mirabegron ER (MYRBETRIQ) 50 MG TB24 tablet Take 50 mg by mouth daily.    nortriptyline (PAMELOR) 50 MG capsule Take 100 mg by mouth at bedtime.    ondansetron (ZOFRAN ODT) 4 MG disintegrating tablet Allow 1-2 tablets to dissolve in your mouth every 8 hours as needed for nausea/vomiting Qty: 30 tablet, Refills: 0    pentosan polysulfate (ELMIRON) 100 MG capsule Take 100 mg by mouth 3 (three) times daily.    propranolol (INDERAL) 40 MG tablet Take 40 mg by mouth 2 (two) times daily.    ranitidine (ZANTAC) 300 MG tablet Take 300 mg by mouth at bedtime.    tiZANidine (ZANAFLEX) 2 MG tablet Take 2 mg by mouth every 8 (eight) hours as needed for muscle spasms.    traMADol (ULTRAM) 50 MG tablet Take 1 tablet (50 mg total) by mouth 3 (three) times daily as needed for moderate pain or severe pain. Qty: 20 tablet, Refills: 0    UNABLE TO FIND Med Name: Bladder cocktail - Heparin,  Sodium Bicarbonate and Lidocaine.  Inject into catheter as needed for pain.    ustekinumab (STELARA) 90 MG/ML SOSY injection Inject 90 mg into the skin every 3 (three) months.     !! - Potential duplicate medications found. Please discuss with provider.    STOP taking these medications     pregabalin (LYRICA) 150 MG capsule      opium-belladonna (B&O SUPPRETTES) 16.2-60 MG suppository      oxybutynin (DITROPAN) 5 MG tablet      promethazine (PHENERGAN) 12.5 MG tablet          DISCHARGE INSTRUCTIONS:   Follow-up with primary care physician in 4-5 days Continue Foley catheter with leg bag Follow-up with the urologist as an outpatient on Thursday as scheduled before   DIET:  Cardiac diet  DISCHARGE CONDITION:  Stable  ACTIVITY:  Activity as tolerated ,refused PT eval  OXYGEN:  Home Oxygen: No.   Oxygen Delivery:  room air  DISCHARGE LOCATION:  home   If you experience worsening of your admission symptoms, develop shortness of breath, life threatening emergency, suicidal or homicidal thoughts you must seek medical attention immediately by calling 911 or calling your MD immediately  if symptoms less severe.  You Must read complete instructions/literature along with all the possible adverse reactions/side effects for all the Medicines you take and that have been prescribed to you. Take any new Medicines after you have completely understood and accpet all the possible adverse reactions/side effects.   Please note  You were cared for by a hospitalist during your hospital stay. If you have any questions about your discharge medications or the care you received while you were in the hospital after you are discharged, you can call the unit and asked to speak with the hospitalist on call if the hospitalist that took care of you is not available. Once you are discharged, your primary care physician will handle any further medical issues. Please note that NO REFILLS for any  discharge medications will be authorized once you are discharged, as it is imperative that you return to your primary care physician (or establish a relationship with a primary care physician if you do not have one) for your aftercare needs so that they can reassess your need for medications and monitor your lab values.     Today  Chief Complaint  Patient presents with  . Flank Pain  . Post-op Problem    Patient is feeling better after having bowel movements. Abdominal cramps improving. Wants to go home  ROS:  CONSTITUTIONAL: Denies fevers, chills. Denies any fatigue, weakness.  EYES: Denies blurry vision, double vision, eye pain. EARS, NOSE, THROAT: Denies tinnitus, ear pain, hearing loss. RESPIRATORY: Denies cough, wheeze, shortness of breath.  CARDIOVASCULAR: Denies chest pain, palpitations, edema.  GASTROINTESTINAL: Denies nausea, vomiting, diarrhea, abdominal pain. Denies bright red blood per rectum. GENITOURINARY: Denies dysuria, hematuria. ENDOCRINE: Denies nocturia or thyroid problems. HEMATOLOGIC AND LYMPHATIC: Denies easy bruising or bleeding. SKIN: Denies rash or lesion. MUSCULOSKELETAL: Denies pain in neck, back, shoulder, knees, hips or arthritic symptoms.  NEUROLOGIC: Denies paralysis, paresthesias.  PSYCHIATRIC: Denies anxiety or depressive symptoms.   VITAL SIGNS:  Blood pressure 137/79, pulse 88, temperature 98.4 F (36.9 C), temperature source Oral, resp. rate 16, height 5\' 2"  (1.575 m), weight 117.2 kg (258 lb 6.4 oz), SpO2 98 %.  I/O:    Intake/Output Summary (Last 24 hours) at 03/05/17 1428 Last data filed at 03/05/17 1036  Gross per 24 hour  Intake              480 ml  Output             3550 ml  Net            -3070 ml    PHYSICAL EXAMINATION:  GENERAL:  56 y.o.-year-old patient lying in the bed with no acute distress.  EYES: Pupils equal, round, reactive to light and accommodation. No scleral icterus. Extraocular muscles intact.  HEENT: Head  atraumatic, normocephalic. Oropharynx and nasopharynx clear.  NECK:  Supple, no jugular venous distention. No thyroid enlargement, no tenderness.  LUNGS: Normal breath sounds bilaterally, no wheezing, rales,rhonchi or crepitation. No use of accessory muscles of respiration.  CARDIOVASCULAR: S1, S2 normal. No murmurs, rubs, or gallops.  ABDOMEN: Soft, non-tender, non-distended. Bowel sounds present. No organomegaly or mass.  EXTREMITIES: No pedal edema, cyanosis, or clubbing.  NEUROLOGIC: Cranial nerves II through XII are intact. Muscle strength 5/5  in all extremities. Sensation intact. Gait not checked.  PSYCHIATRIC: The patient is alert and oriented x 3.  SKIN: No obvious rash, lesion, or ulcer.   DATA REVIEW:   CBC  Recent Labs Lab 03/02/17 0559  WBC 13.6*  HGB 10.8*  HCT 33.0*  PLT 267    Chemistries   Recent Labs Lab 02/28/17 0844 03/04/17 0509  NA 135  --   K 4.0  --   CL 104  --   CO2 22  --   GLUCOSE 122*  --   BUN 21*  --   CREATININE 1.03* 1.11*  CALCIUM 8.3*  --     Cardiac Enzymes No results for input(s): TROPONINI in the last 168 hours.  Microbiology Results  Results for orders placed or performed during the hospital encounter of 02/25/17  Urine Culture     Status: None   Collection Time: 02/25/17  1:32 AM  Result Value Ref Range Status   Specimen Description URINE, RANDOM  Final   Special Requests NONE  Final   Culture   Final    NO GROWTH Performed at [facility], [address], [place], [place] [address]    Report Status 02/27/2017 FINAL  Final  CULTURE, BLOOD (ROUTINE X 2) w Reflex to ID Panel     Status: None   Collection Time: 02/28/17  8:27 AM  Result Value Ref Range Status   Specimen Description BLOOD BLOOD RIGHT HAND  Final   Special Requests   Final    BOTTLES DRAWN AEROBIC AND ANAEROBIC Blood Culture adequate volume   Culture NO GROWTH 5 DAYS  Final   Report Status 03/05/2017 FINAL  Final  CULTURE, BLOOD (ROUTINE X 2) w  Reflex to ID Panel     Status: None   Collection Time: 02/28/17  9:02 AM  Result Value Ref Range Status   Specimen Description BLOOD Blood Culture adequate volume  Final   Special Requests BOTTLES DRAWN AEROBIC AND ANAEROBIC BLOOD LEFT ARM  Final   Culture NO GROWTH 5 DAYS  Final   Report Status 03/05/2017 FINAL  Final    RADIOLOGY:  No results found.  EKG:   Orders placed or performed in visit on 06/14/14  . EKG 12-Lead      Management plans discussed with the patient, family and they are in agreement.  CODE STATUS:     Code Status Orders        Start     Ordered   02/25/17 0953  Full code  Continuous     02/25/17 0953    Code Status History    Date Active Date Inactive Code Status Order ID Comments User Context   08/11/2016  9:23 AM 08/13/2016  9:27 PM Full Code [id number]  Adrian Saran, MD Inpatient   06/21/2016 10:42 AM 06/22/2016  7:42 PM Full Code [id number]  Katha Hamming, MD ED      TOTAL TIME TAKING CARE OF THIS PATIENT: 45 minutes.   Note: This dictation was prepared with Dragon dictation along with smaller phrase technology. Any transcriptional errors that result from this process are unintentional.   @M[facility] @  on 03/05/2017 at 2:28 PM  Between 7am to 6pm - Pager - [phone]  After 6pm go to [email or web address] - password [email or web address] [facility]  [facility]  Office  [phone]  CC: Primary care physician; Rayetta Humphrey, MD

## 2017-06-25 ENCOUNTER — Other Ambulatory Visit: Payer: Self-pay

## 2017-06-25 ENCOUNTER — Encounter: Payer: Self-pay | Admitting: Emergency Medicine

## 2017-06-25 ENCOUNTER — Emergency Department
Admission: EM | Admit: 2017-06-25 | Discharge: 2017-06-25 | Disposition: A | Discharge status: Home / Self Care | Payer: Medicare Other | Attending: Emergency Medicine | Admitting: Emergency Medicine

## 2017-06-25 DIAGNOSIS — N390 Urinary tract infection, site not specified: Secondary | ICD-10-CM | POA: Insufficient documentation

## 2017-06-25 DIAGNOSIS — R338 Other retention of urine: Secondary | ICD-10-CM

## 2017-06-25 DIAGNOSIS — I10 Essential (primary) hypertension: Secondary | ICD-10-CM | POA: Insufficient documentation

## 2017-06-25 DIAGNOSIS — R339 Retention of urine, unspecified: Secondary | ICD-10-CM | POA: Diagnosis present

## 2017-06-25 LAB — URINALYSIS, ROUTINE W REFLEX MICROSCOPIC
Bilirubin Urine: NEGATIVE
GLUCOSE, UA: NEGATIVE mg/dL
KETONES UR: NEGATIVE mg/dL
Leukocytes, UA: NEGATIVE
NITRITE: POSITIVE — AB
PH: 6 (ref 5.0–8.0)
Protein, ur: 100 mg/dL — AB
Specific Gravity, Urine: 1.012 (ref 1.005–1.030)

## 2017-06-25 MED ORDER — OXYCODONE-ACETAMINOPHEN 5-325 MG PO TABS
2.0000 | ORAL_TABLET | Freq: Once | ORAL | Status: AC
  Administered 2017-06-25: 2 via ORAL
  Filled 2017-06-25: qty 2

## 2017-06-25 MED ORDER — CEPHALEXIN 500 MG PO CAPS
500.0000 mg | ORAL_CAPSULE | Freq: Once | ORAL | Status: AC
  Administered 2017-06-25: 500 mg via ORAL
  Filled 2017-06-25: qty 1

## 2017-06-25 NOTE — ED Triage Notes (Addendum)
Patient ambulatory to triage with steady gait, without difficulty or distress noted; pt [facility]; was not sent home with any cath; c/o urinary retention since 12noon with discomfort

## 2017-06-25 NOTE — ED Provider Notes (Signed)
[facility] Emergency Department Provider Note       Time seen: ----------------------------------------- 9:00 PM on 06/25/2017 -----------------------------------------   I have reviewed the triage vital signs and the nursing notes.  HISTORY   Chief Complaint Urinary Retention    HPI Joanna James is a 57 y.o. female with a history of chronic pain, fibromyalgia, gout, hypertension, interstitial cystitis and spinal stenosis who presents to the ED for urinary retention.  Patient reportedly was at [facility] today and received a Botox injection for interstitial cystitis.  She was catheterized during the procedure and was sent home after the procedure.  She noticed when she got home she could not urinate.  She has not urinated since 12 noon.  She has had to be catheterized at some point in the past.  Pain is 8 out of 10 in the lower abdomen.  Past Medical History:  Diagnosis Date  . Chronic pain syndrome   . Fibromyalgia   . Gout   . Hypertension   . IC (interstitial cystitis)   . Spinal stenosis     Patient Active Problem List   Diagnosis Date Noted  . UTI (urinary tract infection) 02/25/2017  . Pain 08/11/2016  . Abdominal pain 06/21/2016    Past Surgical History:  Procedure Laterality Date  . ABDOMINAL HYSTERECTOMY    . APPENDECTOMY    . BLADDER SUSPENSION    . CYSTOSCOPY WITH STENT PLACEMENT Left 08/13/2016   Procedure: CYSTOSCOPY WITH STENT PLACEMENT;  Surgeon: Bjorn Pippin, MD;  Location: [facility] ORS;  Service: Urology;  Laterality: Left;  . ECTOPIC PREGNANCY SURGERY    . meniscus tear    . ROTATOR CUFF REPAIR    . THYROIDECTOMY    . TUBAL LIGATION      Allergies Amoxicillin; Azithromycin; Ciprofloxacin; and Sulfa antibiotics  Social History Social History   Tobacco Use  . Smoking status: Never Smoker  . Smokeless tobacco: Never Used  Substance Use Topics  . Alcohol use: Yes    Comment: occasional  . Drug use: No     Review of Systems Constitutional: Negative for fever. Cardiovascular: Negative for chest pain. Respiratory: Negative for shortness of breath. Gastrointestinal: Positive for lower abdominal pain. Genitourinary: Positive for urinary retention Musculoskeletal: Negative for back pain. Skin: Negative for rash. Neurological: Negative for headaches, focal weakness or numbness.  All systems negative/normal/unremarkable except as stated in the HPI  ____________________________________________   PHYSICAL EXAM:  VITAL SIGNS: ED Triage Vitals  Enc Vitals Group     BP 06/25/17 2057 (!) 171/100     Pulse Rate 06/25/17 2057 (!) 114     Resp 06/25/17 2057 18     Temp 06/25/17 2057 98.3 F (36.8 C)     Temp Source 06/25/17 2057 Oral     SpO2 06/25/17 2057 97 %     Weight 06/25/17 2056 230 lb (104.3 kg)     Height 06/25/17 2056 5\' 4"  (1.626 m)     Head Circumference --      Peak Flow --      Pain Score 06/25/17 2056 8     Pain Loc --      Pain Edu? --      Excl. in GC? --     Constitutional: Alert and oriented. Well appearing and in no distress. Eyes: Conjunctivae are normal. Normal extraocular movements. Cardiovascular: Normal rate, regular rhythm. No murmurs, rubs, or gallops. Respiratory: Normal respiratory effort without tachypnea nor retractions. Breath sounds are clear and equal  bilaterally. No wheezes/rales/rhonchi. Gastrointestinal: Mild suprapubic tenderness, no rebound or guarding.  Normal bowel sounds. Musculoskeletal: Nontender with normal range of motion in extremities. No lower extremity tenderness nor edema. Neurologic:  Normal speech and language. No gross focal neurologic deficits are appreciated.  Skin:  Skin is warm, dry and intact. No rash noted. Psychiatric: Mood and affect are normal. Speech and behavior are normal.  ____________________________________________  ED COURSE:  As part of my medical decision making, I reviewed the following data within the  electronic MEDICAL RECORD NUMBER History obtained from family if available, nursing notes, old chart and ekg, as well as notes from prior ED visits. Patient presented for urinary retention and suprapubic pain, we will assess with labs and imaging as indicated at this time.   Procedures ____________________________________________   LABS (pertinent positives/negatives)  Labs Reviewed  URINALYSIS, ROUTINE W REFLEX MICROSCOPIC - Abnormal; Notable for the following components:      Result Value   Color, Urine AMBER (*)    APPearance CLOUDY (*)    Hgb urine dipstick MODERATE (*)    Protein, ur 100 (*)    Nitrite POSITIVE (*)    Bacteria, UA FEW (*)    Squamous Epithelial / LPF 0-5 (*)    All other components within normal limits  URINE CULTURE  ____________________________________________  DIFFERENTIAL DIAGNOSIS   Urinary retention, UTI, renal colic, medication side effect  FINAL ASSESSMENT AND PLAN  Acute urinary retention   Plan: Patient had presented for acute urinary retention. Patient's labs are indeterminate for infection.  We did send a urine culture.  Keflex tonight as well as Percocet for pain.  The doctor that performed the procedure on her today also has written for an antibiotic and pain medicine for her to take at home.  I would advise getting these filled and starting them in the morning.  Ulice DashJohnathan E Williams, MD   Note: This note was generated in part or whole with voice recognition software. Voice recognition is usually quite accurate but there are transcription errors that can and very often do occur. I apologize for any typographical errors that were not detected and corrected.     Emily FilbertWilliams, Jonathan E, MD 06/25/17 2154

## 2017-06-27 LAB — URINE CULTURE
CULTURE: NO GROWTH
SPECIAL REQUESTS: NORMAL

## 2017-09-15 IMAGING — CR DG LUMBAR SPINE 2-3V
3 series · 3 of 3 positions shown · non-contrast
Comparison: No priors.

CLINICAL DATA: 54-year-old female with chronic back pain since
9426. Recent acute worsening for the past 3 days, with some
radiation of pain into the right leg.

EXAM:
LUMBAR SPINE - 2-3 VIEW

[l-spine ap]
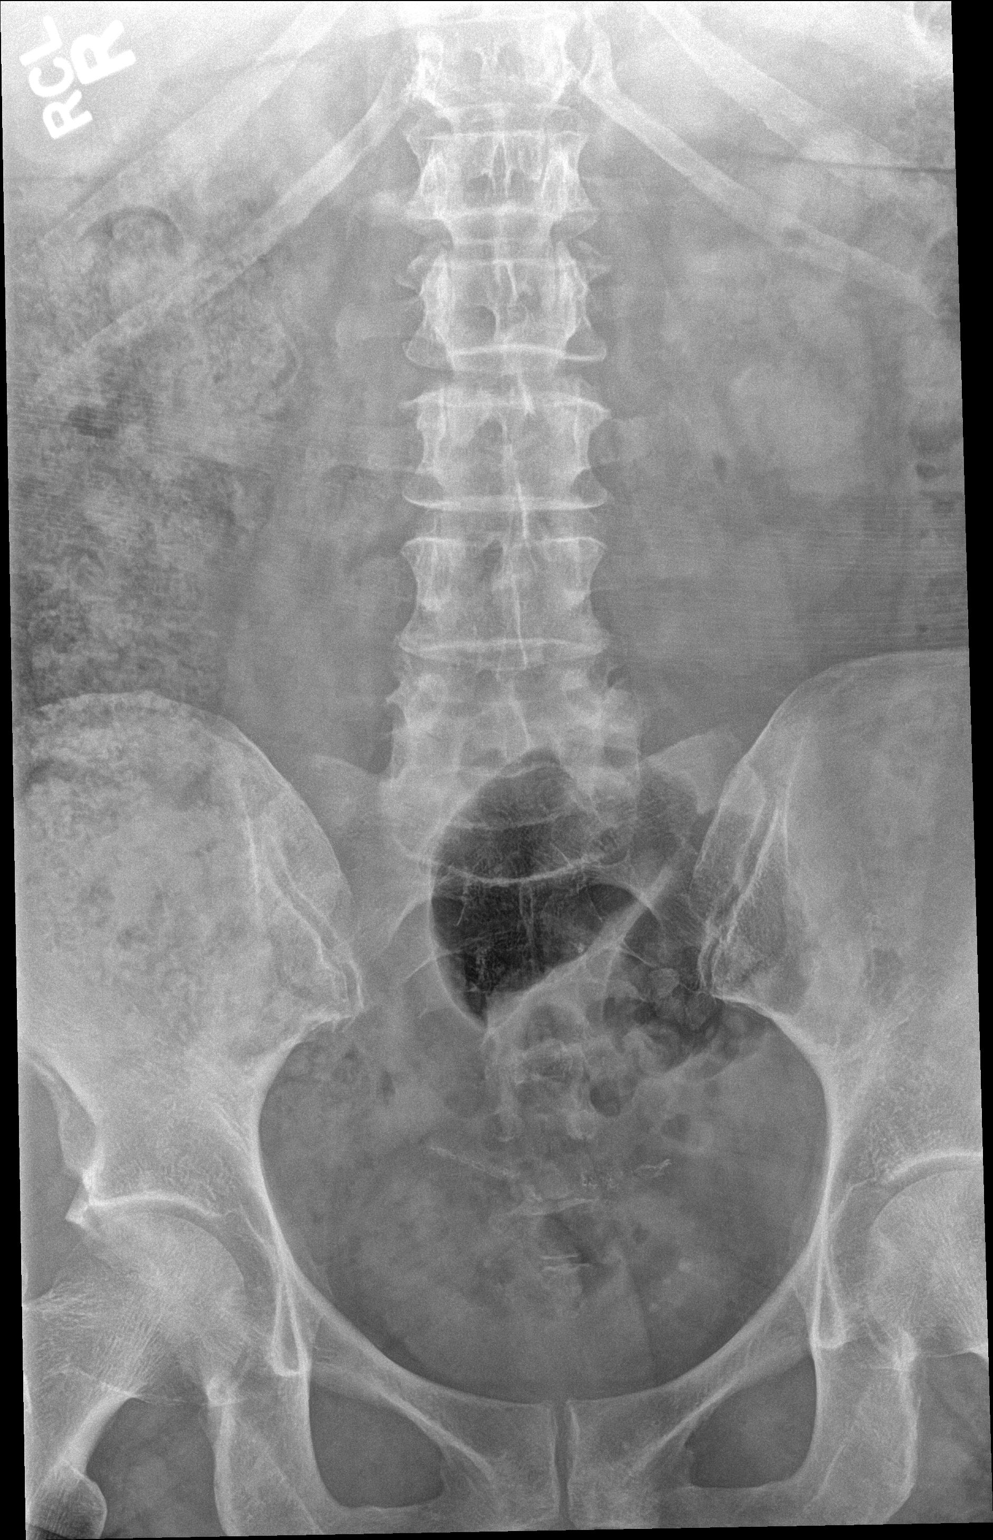

[l-spine lat]
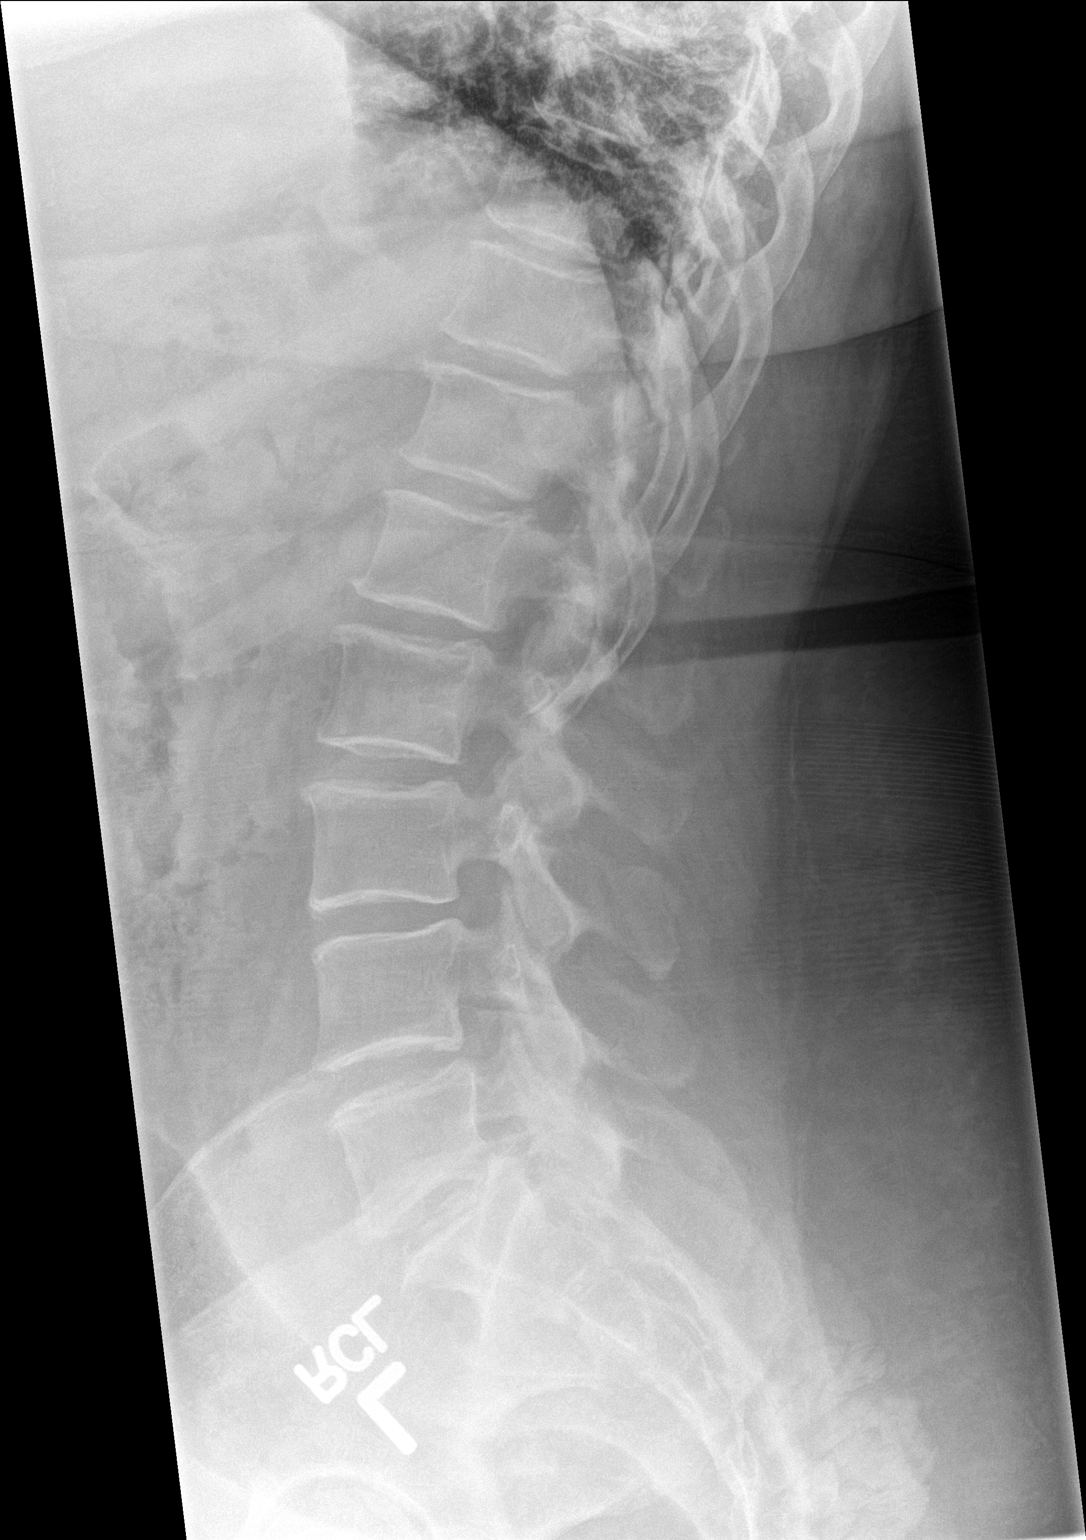

[l-spine spot]
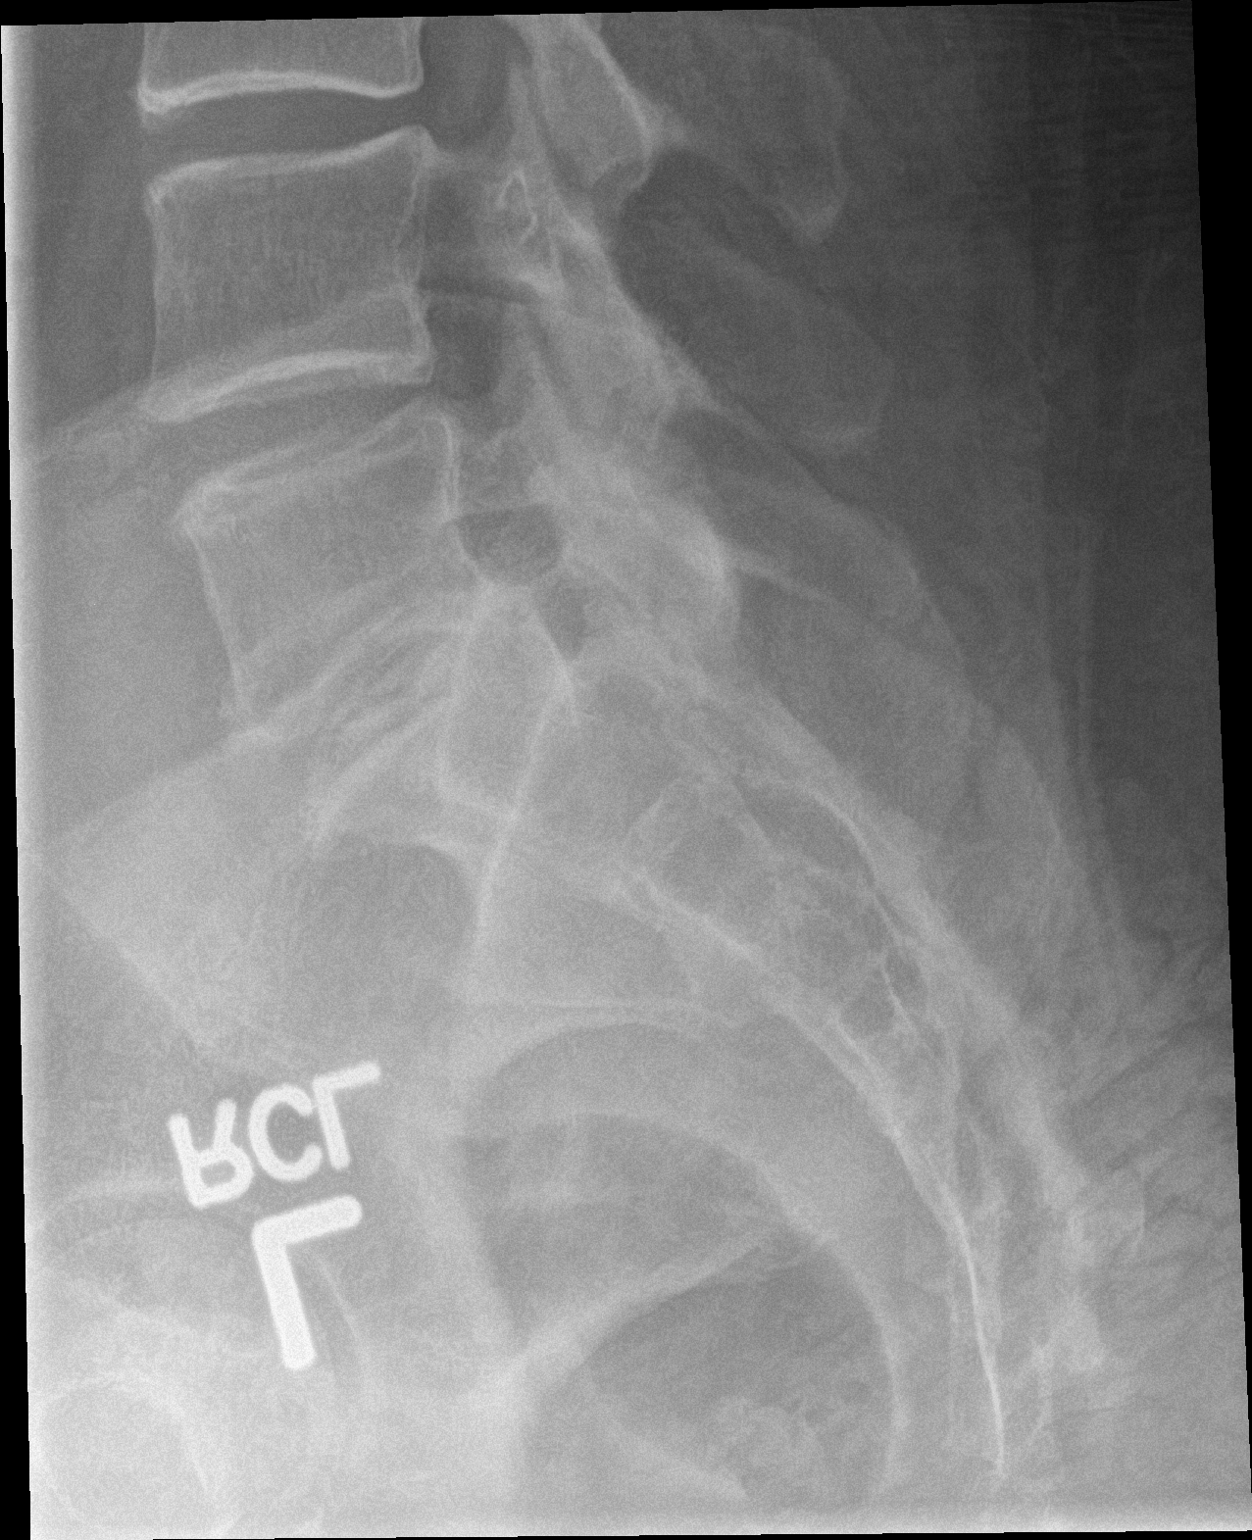

[3 of 3 positions shown; findings below may reference images not displayed]

FINDINGS: Three views of the lumbar spine demonstrate no acute displaced
fractures or compression type fractures. Alignment is anatomic. Mild
multilevel degenerative disc disease and facet arthropathy, most
apparent at L5-S1.
IMPRESSION: 1. No acute radiographic abnormality of the lumbar spine.
2. Mild multilevel degenerative disc disease and lumbar spondylosis.

## 2017-11-02 ENCOUNTER — Other Ambulatory Visit: Payer: Self-pay | Admitting: Family Medicine

## 2017-11-02 DIAGNOSIS — Z1231 Encounter for screening mammogram for malignant neoplasm of breast: Secondary | ICD-10-CM

## 2017-11-22 ENCOUNTER — Ambulatory Visit
Admission: RE | Admit: 2017-11-22 | Discharge: 2017-11-22 | Disposition: A | Discharge status: Home / Self Care | Payer: Medicare Other | Source: Ambulatory Visit | Attending: Family Medicine | Admitting: Family Medicine

## 2017-11-22 DIAGNOSIS — Z1231 Encounter for screening mammogram for malignant neoplasm of breast: Secondary | ICD-10-CM | POA: Diagnosis not present

## 2017-12-19 DIAGNOSIS — Z79899 Other long term (current) drug therapy: Secondary | ICD-10-CM | POA: Insufficient documentation

## 2018-01-09 DIAGNOSIS — G4733 Obstructive sleep apnea (adult) (pediatric): Secondary | ICD-10-CM | POA: Insufficient documentation

## 2018-01-23 ENCOUNTER — Other Ambulatory Visit: Payer: Self-pay | Admitting: Family Medicine

## 2018-01-23 DIAGNOSIS — Z780 Asymptomatic menopausal state: Secondary | ICD-10-CM

## 2018-04-02 DIAGNOSIS — N390 Urinary tract infection, site not specified: Secondary | ICD-10-CM | POA: Insufficient documentation

## 2018-11-28 DIAGNOSIS — M2391 Unspecified internal derangement of right knee: Secondary | ICD-10-CM | POA: Insufficient documentation

## 2018-12-26 DIAGNOSIS — Z6841 Body Mass Index (BMI) 40.0 and over, adult: Secondary | ICD-10-CM | POA: Insufficient documentation

## 2018-12-26 DIAGNOSIS — Z9189 Other specified personal risk factors, not elsewhere classified: Secondary | ICD-10-CM | POA: Insufficient documentation

## 2018-12-27 DIAGNOSIS — E7841 Elevated Lipoprotein(a): Secondary | ICD-10-CM | POA: Insufficient documentation

## 2019-01-23 DIAGNOSIS — S838X2D Sprain of other specified parts of left knee, subsequent encounter: Secondary | ICD-10-CM | POA: Insufficient documentation

## 2019-06-09 DIAGNOSIS — N3941 Urge incontinence: Secondary | ICD-10-CM | POA: Insufficient documentation

## 2019-08-08 ENCOUNTER — Ambulatory Visit: Payer: Medicare Other | Attending: Internal Medicine

## 2019-08-08 DIAGNOSIS — Z23 Encounter for immunization: Secondary | ICD-10-CM

## 2019-08-08 NOTE — Progress Notes (Signed)
   Covid-19 Vaccination Clinic  Name:  Joanna James    MRN: [id number] DOB: 12/15/1960  08/08/2019  [name] was observed post Covid-19 immunization for 15 minutes without incident. She was provided with Vaccine Information Sheet and instruction to access the V-Safe system.   [name] was instructed to call 911 with any severe reactions post vaccine: Marland Kitchen Difficulty breathing  . Swelling of face and throat  . A fast heartbeat  . A bad rash all over body  . Dizziness and weakness   Immunizations Administered    Name Date Dose VIS Date Route   Pfizer COVID-19 Vaccine 08/08/2019  9:10 AM 0.3 mL 04/25/2019 Intramuscular   Manufacturer: [employer], [employer]   Lot: [id number]   NDC: [address]-0

## 2019-09-02 ENCOUNTER — Ambulatory Visit: Payer: Medicare Other

## 2019-09-03 ENCOUNTER — Ambulatory Visit: Payer: Medicare Other | Attending: Internal Medicine

## 2019-09-03 DIAGNOSIS — Z23 Encounter for immunization: Secondary | ICD-10-CM

## 2019-09-03 NOTE — Progress Notes (Signed)
   Covid-19 Vaccination Clinic  Name:  Joanna James    MRN: [id number] DOB: 06/28/1960  09/03/2019  Joanna James was observed post Covid-19 immunization for 15 minutes without incident. She was provided with Vaccine Information Sheet and instruction to access the V-Safe system.   Joanna James was instructed to call 911 with any severe reactions post vaccine: Marland Kitchen Difficulty breathing  . Swelling of face and throat  . A fast heartbeat  . A bad rash all over body  . Dizziness and weakness   Immunizations Administered    Name Date Dose VIS Date Route   Pfizer COVID-19 Vaccine 09/03/2019 10:52 AM 0.3 mL 07/09/2018 Intramuscular   Manufacturer: [employer], [employer]   Lot: [id number]   NDC: [address]-9

## 2019-10-10 ENCOUNTER — Emergency Department
Admission: EM | Admit: 2019-10-10 | Discharge: 2019-10-10 | Disposition: A | Discharge status: Home / Self Care | Payer: Medicare Other | Attending: Emergency Medicine | Admitting: Emergency Medicine

## 2019-10-10 ENCOUNTER — Encounter: Payer: Self-pay | Admitting: Emergency Medicine

## 2019-10-10 ENCOUNTER — Other Ambulatory Visit: Payer: Self-pay

## 2019-10-10 DIAGNOSIS — R531 Weakness: Secondary | ICD-10-CM | POA: Insufficient documentation

## 2019-10-10 DIAGNOSIS — R112 Nausea with vomiting, unspecified: Secondary | ICD-10-CM | POA: Insufficient documentation

## 2019-10-10 DIAGNOSIS — Z5321 Procedure and treatment not carried out due to patient leaving prior to being seen by health care provider: Secondary | ICD-10-CM | POA: Diagnosis not present

## 2019-10-10 LAB — COMPREHENSIVE METABOLIC PANEL
ALT: 12 U/L (ref 0–44)
AST: 14 U/L — ABNORMAL LOW (ref 15–41)
Albumin: 4.1 g/dL (ref 3.5–5.0)
Alkaline Phosphatase: 65 U/L (ref 38–126)
Anion gap: 9 (ref 5–15)
BUN: 18 mg/dL (ref 6–20)
CO2: 27 mmol/L (ref 22–32)
Calcium: 8.8 mg/dL — ABNORMAL LOW (ref 8.9–10.3)
Chloride: 105 mmol/L (ref 98–111)
Creatinine, Ser: 1.37 mg/dL — ABNORMAL HIGH (ref 0.44–1.00)
GFR calc Af Amer: 48 mL/min — ABNORMAL LOW (ref >60)
GFR calc non Af Amer: 42 mL/min — ABNORMAL LOW (ref >60)
Glucose, Bld: 106 mg/dL — ABNORMAL HIGH (ref 70–99)
Potassium: 4 mmol/L (ref 3.5–5.1)
Sodium: 141 mmol/L (ref 135–145)
Total Bilirubin: 0.9 mg/dL (ref 0.3–1.2)
Total Protein: 7.2 g/dL (ref 6.5–8.1)

## 2019-10-10 LAB — CBC WITH DIFFERENTIAL/PLATELET
Abs Immature Granulocytes: 0.02 10*3/uL (ref 0.00–0.07)
Basophils Absolute: 0.1 10*3/uL (ref 0.0–0.1)
Basophils Relative: 1 %
Eosinophils Absolute: 0.2 10*3/uL (ref 0.0–0.5)
Eosinophils Relative: 2 %
HCT: 42.1 % (ref 36.0–46.0)
Hemoglobin: 13 g/dL (ref 12.0–15.0)
Immature Granulocytes: 0 %
Lymphocytes Relative: 29 %
Lymphs Abs: 2.4 10*3/uL (ref 0.7–4.0)
MCH: 27.8 pg (ref 26.0–34.0)
MCHC: 30.9 g/dL (ref 30.0–36.0)
MCV: 90 fL (ref 80.0–100.0)
Monocytes Absolute: 0.6 10*3/uL (ref 0.1–1.0)
Monocytes Relative: 7 %
Neutro Abs: 5.1 10*3/uL (ref 1.7–7.7)
Neutrophils Relative %: 61 %
Platelets: 236 10*3/uL (ref 150–400)
RBC: 4.68 MIL/uL (ref 3.87–5.11)
RDW: 13.5 % (ref 11.5–15.5)
WBC: 8.3 10*3/uL (ref 4.0–10.5)
nRBC: 0 % (ref 0.0–0.2)

## 2019-10-10 LAB — TROPONIN I (HIGH SENSITIVITY): Troponin I (High Sensitivity): 4 ng/L (ref <18)

## 2019-10-10 LAB — LIPASE, BLOOD: Lipase: 21 U/L (ref 11–51)

## 2019-10-10 NOTE — ED Notes (Signed)
No answer when called several times from lobby 

## 2019-10-10 NOTE — ED Notes (Signed)
Pt updated in WR, VS reassessed °

## 2019-10-10 NOTE — ED Triage Notes (Signed)
Pt to triage via w/c with no distress noted; mask in place; pt reports N/V x 4 days and weakness

## 2019-10-10 NOTE — ED Notes (Signed)
Pt requested that registration cut her arm band off and stated she was leaving. Unwitnessed by RN

## 2020-08-20 ENCOUNTER — Other Ambulatory Visit: Payer: Self-pay | Admitting: Orthopedic Surgery

## 2020-08-27 ENCOUNTER — Other Ambulatory Visit
Admission: RE | Admit: 2020-08-27 | Discharge: 2020-08-27 | Disposition: A | Discharge status: Home / Self Care | Payer: Medicare Other | Source: Ambulatory Visit | Attending: Orthopedic Surgery | Admitting: Orthopedic Surgery

## 2020-08-27 ENCOUNTER — Other Ambulatory Visit: Payer: Self-pay

## 2020-08-27 DIAGNOSIS — Z01818 Encounter for other preprocedural examination: Secondary | ICD-10-CM | POA: Insufficient documentation

## 2020-08-27 HISTORY — DX: Other complications of anesthesia, initial encounter: T88.59XA

## 2020-08-27 HISTORY — DX: Pneumonia, unspecified organism: J18.9

## 2020-08-27 HISTORY — DX: Gastro-esophageal reflux disease without esophagitis: K21.9

## 2020-08-27 HISTORY — DX: Sleep apnea, unspecified: G47.30

## 2020-08-27 NOTE — Patient Instructions (Addendum)
Your procedure is scheduled on: 09/07/20 - Tuesday Report to the Registration Desk on the 1st floor of the [facility]. To find out your arrival time, please call [phone] between 1PM - 3PM on: 09/06/20 - Monday Report to [facility] 09/03/20 at 9 am for Labs/EKG/Bag.  REMEMBER: Instructions that are not followed completely may result in serious medical risk, up to and including death; or upon the discretion of your surgeon and anesthesiologist your surgery may need to be rescheduled.  Do not eat food or drink any fluids after midnight the night before surgery.  No gum chewing, lozengers or hard candies.   TAKE THESE MEDICATIONS THE MORNING OF SURGERY WITH A SIP OF WATER:  - amLODipine (NORVASC) 5 MG tablet - famotidine (PEPCID) 20 MG tablet, take one the night before and one on the morning of surgery - helps to prevent nausea after surgery. - gabapentin (NEURONTIN) 300 MG capsule   Follow recommendations from Cardiologist, Pulmonologist or PCP regarding stopping Aspirin, Coumadin, Plavix, Eliquis, Pradaxa, or Pletal.  One week prior to surgery: Stop Anti-inflammatories (NSAIDS) such as Advil, Aleve, Ibuprofen, Motrin, Naproxen, Naprosyn and Aspirin based products such as Excedrin, Goodys Powder, BC Powder.  Stop ANY OVER THE COUNTER supplements until after surgery.  No Alcohol for 24 hours before or after surgery.  No Smoking including e-cigarettes for 24 hours prior to surgery.  No chewable tobacco products for at least 6 hours prior to surgery.  No nicotine patches on the day of surgery.  Do not use any "recreational" drugs for at least a week prior to your surgery.  Please be advised that the combination of cocaine and anesthesia may have negative outcomes, up to and including death. If you test positive for cocaine, your surgery will be cancelled.  On the morning of surgery brush your teeth with toothpaste and water, you may rinse your mouth with mouthwash if you  wish. Do not swallow any toothpaste or mouthwash.  Do not wear jewelry, make-up, hairpins, clips or nail polish.  Do not wear lotions, powders, or perfumes.   Do not shave body from the neck down 48 hours prior to surgery just in case you cut yourself which could leave a site for infection.  Also, freshly shaved skin may become irritated if using the CHG soap.  Contact lenses, hearing aids and dentures may not be worn into surgery.  Do not bring valuables to the hospital. [facility] is not responsible for any missing/lost belongings or valuables.   Use CHG Soap or wipes as directed on instruction sheet.   Notify your doctor if there is any change in your medical condition (cold, fever, infection).  Wear comfortable clothing (specific to your surgery type) to the hospital.  Plan for stool softeners for home use; pain medications have a tendency to cause constipation. You can also help prevent constipation by eating foods high in fiber such as fruits and vegetables and drinking plenty of fluids as your diet allows.  After surgery, you can help prevent lung complications by doing breathing exercises.  Take deep breaths and cough every 1-2 hours. Your doctor may order a device called an Incentive Spirometer to help you take deep breaths. When coughing or sneezing, hold a pillow firmly against your incision with both hands. This is called "splinting." Doing this helps protect your incision. It also decreases belly discomfort.  If you are being admitted to the hospital overnight, leave your suitcase in the car. After surgery it may be brought  to your room.  If you are being discharged the day of surgery, you will not be allowed to drive home. You will need a responsible adult (18 years or older) to drive you home and stay with you that night.   If you are taking public transportation, you will need to have a responsible adult (18 years or older) with you. Please confirm with your  physician that it is acceptable to use public transportation.   Please call the Pre-admissions Testing Dept. at [phone] if you have any questions about these instructions.  Surgery Visitation Policy:  Patients undergoing a surgery or procedure may have one family member or support person with them as long as that person is not COVID-19 positive or experiencing its symptoms.  That person may remain in the waiting area during the procedure.  Inpatient Visitation:    Visiting hours are 7 a.m. to 8 p.m. Inpatients will be allowed two visitors daily. The visitors may change each day during the patient's stay. No visitors under the age of 51. Any visitor under the age of 88 must be accompanied by an adult. The visitor must pass COVID-19 screenings, use hand sanitizer when entering and exiting the patient's room and wear a mask at all times, including in the patient's room. Patients must also wear a mask when staff or their visitor are in the room. Masking is required regardless of vaccination status.

## 2020-09-03 ENCOUNTER — Other Ambulatory Visit: Payer: Self-pay

## 2020-09-03 ENCOUNTER — Other Ambulatory Visit
Admission: RE | Admit: 2020-09-03 | Discharge: 2020-09-03 | Disposition: A | Discharge status: Home / Self Care | Payer: Medicare Other | Source: Ambulatory Visit | Attending: Orthopedic Surgery | Admitting: Orthopedic Surgery

## 2020-09-03 DIAGNOSIS — Z01818 Encounter for other preprocedural examination: Secondary | ICD-10-CM | POA: Diagnosis not present

## 2020-09-03 LAB — CBC WITH DIFFERENTIAL/PLATELET
Abs Immature Granulocytes: 0.04 10*3/uL (ref 0.00–0.07)
Basophils Absolute: 0.1 10*3/uL (ref 0.0–0.1)
Basophils Relative: 1 %
Eosinophils Absolute: 0.1 10*3/uL (ref 0.0–0.5)
Eosinophils Relative: 1 %
HCT: 40.8 % (ref 36.0–46.0)
Hemoglobin: 12.9 g/dL (ref 12.0–15.0)
Immature Granulocytes: 1 %
Lymphocytes Relative: 32 %
Lymphs Abs: 2.5 10*3/uL (ref 0.7–4.0)
MCH: 28 pg (ref 26.0–34.0)
MCHC: 31.6 g/dL (ref 30.0–36.0)
MCV: 88.7 fL (ref 80.0–100.0)
Monocytes Absolute: 0.7 10*3/uL (ref 0.1–1.0)
Monocytes Relative: 9 %
Neutro Abs: 4.4 10*3/uL (ref 1.7–7.7)
Neutrophils Relative %: 56 %
Platelets: 325 10*3/uL (ref 150–400)
RBC: 4.6 MIL/uL (ref 3.87–5.11)
RDW: 13.3 % (ref 11.5–15.5)
WBC: 7.7 10*3/uL (ref 4.0–10.5)
nRBC: 0 % (ref 0.0–0.2)

## 2020-09-03 LAB — BASIC METABOLIC PANEL
Anion gap: 9 (ref 5–15)
BUN: 15 mg/dL (ref 6–20)
CO2: 29 mmol/L (ref 22–32)
Calcium: 9.2 mg/dL (ref 8.9–10.3)
Chloride: 103 mmol/L (ref 98–111)
Creatinine, Ser: 1.14 mg/dL — ABNORMAL HIGH (ref 0.44–1.00)
GFR, Estimated: 55 mL/min — ABNORMAL LOW (ref >60)
Glucose, Bld: 94 mg/dL (ref 70–99)
Potassium: 4 mmol/L (ref 3.5–5.1)
Sodium: 141 mmol/L (ref 135–145)

## 2020-09-03 LAB — APTT: aPTT: 32 seconds (ref 24–36)

## 2020-09-03 LAB — PROTIME-INR
INR: 1 (ref 0.8–1.2)
Prothrombin Time: 13.2 seconds (ref 11.4–15.2)

## 2020-09-06 MED ORDER — CHLORHEXIDINE GLUCONATE CLOTH 2 % EX PADS
6.0000 | MEDICATED_PAD | Freq: Once | CUTANEOUS | Status: AC
  Administered 2020-09-07: 6 via TOPICAL

## 2020-09-06 MED ORDER — ACETAMINOPHEN 500 MG PO TABS: 1000.0000 mg | ORAL_TABLET | ORAL | Status: AC

## 2020-09-06 MED ORDER — CHLORHEXIDINE GLUCONATE 0.12 % MT SOLN: 15.0000 mL | Freq: Once | OROMUCOSAL | Status: AC

## 2020-09-06 MED ORDER — LACTATED RINGERS IV SOLN: INTRAVENOUS | Status: DC

## 2020-09-06 MED ORDER — ORAL CARE MOUTH RINSE: 15.0000 mL | Freq: Once | OROMUCOSAL | Status: AC

## 2020-09-06 MED ORDER — CEFAZOLIN SODIUM-DEXTROSE 2-4 GM/100ML-% IV SOLN
2.0000 g | INTRAVENOUS | Status: AC
  Administered 2020-09-07: 2 g via INTRAVENOUS

## 2020-09-07 ENCOUNTER — Observation Stay
Admission: RE | Admit: 2020-09-07 | Discharge: 2020-09-08 | Disposition: A | Discharge status: Home / Self Care | Payer: Medicare Other | Attending: Orthopedic Surgery | Admitting: Orthopedic Surgery

## 2020-09-07 ENCOUNTER — Encounter
Admission: RE | Disposition: A | Discharge status: Home / Self Care | Payer: Self-pay | Source: Home / Self Care | Attending: Orthopedic Surgery

## 2020-09-07 ENCOUNTER — Ambulatory Visit: Payer: Medicare Other | Admitting: Anesthesiology

## 2020-09-07 ENCOUNTER — Other Ambulatory Visit: Payer: Self-pay

## 2020-09-07 ENCOUNTER — Encounter: Payer: Self-pay | Admitting: Orthopedic Surgery

## 2020-09-07 ENCOUNTER — Ambulatory Visit: Payer: Medicare Other | Admitting: Urgent Care

## 2020-09-07 DIAGNOSIS — S46021A Laceration of muscle(s) and tendon(s) of the rotator cuff of right shoulder, initial encounter: Secondary | ICD-10-CM | POA: Diagnosis not present

## 2020-09-07 DIAGNOSIS — M19011 Primary osteoarthritis, right shoulder: Secondary | ICD-10-CM | POA: Diagnosis not present

## 2020-09-07 DIAGNOSIS — M7541 Impingement syndrome of right shoulder: Secondary | ICD-10-CM | POA: Insufficient documentation

## 2020-09-07 DIAGNOSIS — Z7982 Long term (current) use of aspirin: Secondary | ICD-10-CM | POA: Diagnosis not present

## 2020-09-07 DIAGNOSIS — S46201A Unspecified injury of muscle, fascia and tendon of other parts of biceps, right arm, initial encounter: Secondary | ICD-10-CM | POA: Diagnosis not present

## 2020-09-07 DIAGNOSIS — I10 Essential (primary) hypertension: Secondary | ICD-10-CM | POA: Diagnosis not present

## 2020-09-07 DIAGNOSIS — Z79899 Other long term (current) drug therapy: Secondary | ICD-10-CM | POA: Insufficient documentation

## 2020-09-07 DIAGNOSIS — S4991XA Unspecified injury of right shoulder and upper arm, initial encounter: Secondary | ICD-10-CM | POA: Diagnosis present

## 2020-09-07 DIAGNOSIS — W19XXXA Unspecified fall, initial encounter: Secondary | ICD-10-CM | POA: Diagnosis not present

## 2020-09-07 DIAGNOSIS — Z9889 Other specified postprocedural states: Secondary | ICD-10-CM

## 2020-09-07 HISTORY — PX: SHOULDER ARTHROSCOPY WITH ROTATOR CUFF REPAIR AND SUBACROMIAL DECOMPRESSION: SHX5686

## 2020-09-07 SURGERY — SHOULDER ARTHROSCOPY WITH ROTATOR CUFF REPAIR AND SUBACROMIAL DECOMPRESSION
Anesthesia: General | Laterality: Right

## 2020-09-07 MED ORDER — HYDROXYZINE HCL 25 MG PO TABS
25.0000 mg | ORAL_TABLET | Freq: Two times a day (BID) | ORAL | Status: DC
  Filled 2020-09-07 (×3): qty 1

## 2020-09-07 MED ORDER — CHLORHEXIDINE GLUCONATE 0.12 % MT SOLN
OROMUCOSAL | Status: AC
  Administered 2020-09-07: 15 mL via OROMUCOSAL
  Filled 2020-09-07: qty 15

## 2020-09-07 MED ORDER — TRAMADOL HCL 50 MG PO TABS
50.0000 mg | ORAL_TABLET | Freq: Four times a day (QID) | ORAL | Status: DC | PRN
  Administered 2020-09-07 – 2020-09-08 (×2): 50 mg via ORAL
  Filled 2020-09-07 (×2): qty 1

## 2020-09-07 MED ORDER — CELECOXIB 100 MG PO CAPS
100.0000 mg | ORAL_CAPSULE | Freq: Two times a day (BID) | ORAL | Status: DC
  Administered 2020-09-07 – 2020-09-08 (×2): 100 mg via ORAL
  Filled 2020-09-07 (×3): qty 1

## 2020-09-07 MED ORDER — PROPOFOL 1000 MG/100ML IV EMUL
INTRAVENOUS | Status: AC
  Filled 2020-09-07: qty 100

## 2020-09-07 MED ORDER — ACETAMINOPHEN 500 MG PO TABS
1000.0000 mg | ORAL_TABLET | Freq: Four times a day (QID) | ORAL | Status: DC
  Administered 2020-09-08 (×3): 1000 mg via ORAL
  Filled 2020-09-07 (×3): qty 2

## 2020-09-07 MED ORDER — PROMETHAZINE HCL 25 MG PO TABS
12.5000 mg | ORAL_TABLET | Freq: Four times a day (QID) | ORAL | Status: DC | PRN
  Filled 2020-09-07: qty 1

## 2020-09-07 MED ORDER — TRAMADOL HCL 50 MG PO TABS: 50.0000 mg | ORAL_TABLET | Freq: Four times a day (QID) | ORAL | Status: DC

## 2020-09-07 MED ORDER — HYDROMORPHONE HCL 2 MG PO TABS: 4.0000 mg | ORAL_TABLET | ORAL | Status: DC | PRN

## 2020-09-07 MED ORDER — BISACODYL 10 MG RE SUPP: 10.0000 mg | Freq: Every day | RECTAL | Status: DC | PRN

## 2020-09-07 MED ORDER — METHOCARBAMOL 1000 MG/10ML IJ SOLN
500.0000 mg | Freq: Four times a day (QID) | INTRAVENOUS | Status: DC | PRN
  Filled 2020-09-07: qty 5

## 2020-09-07 MED ORDER — HYDROMORPHONE HCL 1 MG/ML IJ SOLN
0.5000 mg | INTRAMUSCULAR | Status: DC | PRN
  Administered 2020-09-07: 1 mg via INTRAVENOUS
  Filled 2020-09-07: qty 1

## 2020-09-07 MED ORDER — MENTHOL 3 MG MT LOZG
1.0000 | LOZENGE | OROMUCOSAL | Status: DC | PRN
  Filled 2020-09-07: qty 9

## 2020-09-07 MED ORDER — MAGNESIUM CITRATE PO SOLN
1.0000 | Freq: Once | ORAL | Status: DC | PRN
  Filled 2020-09-07: qty 296

## 2020-09-07 MED ORDER — ONDANSETRON HCL 4 MG/2ML IJ SOLN
INTRAMUSCULAR | Status: AC
  Filled 2020-09-07: qty 2

## 2020-09-07 MED ORDER — HYDRALAZINE HCL 20 MG/ML IJ SOLN
INTRAMUSCULAR | Status: AC
  Filled 2020-09-07: qty 1

## 2020-09-07 MED ORDER — ENOXAPARIN SODIUM 40 MG/0.4ML ~~LOC~~ SOLN
40.0000 mg | SUBCUTANEOUS | Status: DC
  Administered 2020-09-08: 40 mg via SUBCUTANEOUS
  Filled 2020-09-07: qty 0.4

## 2020-09-07 MED ORDER — ACETAMINOPHEN 10 MG/ML IV SOLN: 1000.0000 mg | Freq: Once | INTRAVENOUS | Status: AC

## 2020-09-07 MED ORDER — USTEKINUMAB 90 MG/ML ~~LOC~~ SOSY: 90.0000 mg | PREFILLED_SYRINGE | SUBCUTANEOUS | Status: DC

## 2020-09-07 MED ORDER — GALCANEZUMAB-GNLM 120 MG/ML ~~LOC~~ SOSY: 120.0000 mg | PREFILLED_SYRINGE | SUBCUTANEOUS | Status: DC

## 2020-09-07 MED ORDER — AMLODIPINE BESYLATE 5 MG PO TABS
5.0000 mg | ORAL_TABLET | Freq: Every day | ORAL | Status: DC
  Administered 2020-09-08: 5 mg via ORAL
  Filled 2020-09-07: qty 1

## 2020-09-07 MED ORDER — FENTANYL CITRATE (PF) 100 MCG/2ML IJ SOLN
INTRAMUSCULAR | Status: AC
  Filled 2020-09-07: qty 2

## 2020-09-07 MED ORDER — FAMOTIDINE 20 MG PO TABS
20.0000 mg | ORAL_TABLET | Freq: Two times a day (BID) | ORAL | Status: DC
  Administered 2020-09-07 – 2020-09-08 (×2): 20 mg via ORAL
  Filled 2020-09-07 (×2): qty 1

## 2020-09-07 MED ORDER — HYDROMORPHONE HCL 2 MG PO TABS
2.0000 mg | ORAL_TABLET | Freq: Once | ORAL | Status: AC
Start: 2020-09-07 — End: 2020-09-07
  Administered 2020-09-07: 2 mg via ORAL

## 2020-09-07 MED ORDER — POLYETHYLENE GLYCOL 3350 17 G PO PACK: 17.0000 g | PACK | Freq: Every day | ORAL | Status: DC | PRN

## 2020-09-07 MED ORDER — FENTANYL CITRATE (PF) 100 MCG/2ML IJ SOLN
25.0000 ug | INTRAMUSCULAR | Status: DC | PRN
  Administered 2020-09-07: 50 ug via INTRAVENOUS
  Administered 2020-09-07 (×2): 25 ug via INTRAVENOUS

## 2020-09-07 MED ORDER — ACETAMINOPHEN 500 MG PO TABS
ORAL_TABLET | ORAL | Status: AC
  Administered 2020-09-07: 1000 mg via ORAL
  Filled 2020-09-07: qty 2

## 2020-09-07 MED ORDER — FENTANYL CITRATE (PF) 100 MCG/2ML IJ SOLN
INTRAMUSCULAR | Status: DC | PRN
  Administered 2020-09-07: 25 ug via INTRAVENOUS
  Administered 2020-09-07: 50 ug via INTRAVENOUS
  Administered 2020-09-07: 75 ug via INTRAVENOUS
  Administered 2020-09-07: 50 ug via INTRAVENOUS

## 2020-09-07 MED ORDER — LINACLOTIDE 72 MCG PO CAPS
72.0000 ug | ORAL_CAPSULE | Freq: Every day | ORAL | Status: DC
  Administered 2020-09-08: 72 ug via ORAL
  Filled 2020-09-07 (×2): qty 1

## 2020-09-07 MED ORDER — SENNA 8.6 MG PO TABS
1.0000 | ORAL_TABLET | Freq: Two times a day (BID) | ORAL | Status: DC
  Administered 2020-09-07 – 2020-09-08 (×2): 8.6 mg via ORAL
  Filled 2020-09-07 (×2): qty 1

## 2020-09-07 MED ORDER — PHENOL 1.4 % MT LIQD
1.0000 | OROMUCOSAL | Status: DC | PRN
  Filled 2020-09-07: qty 177

## 2020-09-07 MED ORDER — SODIUM CHLORIDE 0.9 % IV SOLN
INTRAVENOUS | Status: DC | PRN
  Administered 2020-09-07: 30 ug/min via INTRAVENOUS

## 2020-09-07 MED ORDER — GABAPENTIN 300 MG PO CAPS
600.0000 mg | ORAL_CAPSULE | Freq: Three times a day (TID) | ORAL | Status: DC
  Administered 2020-09-07 – 2020-09-08 (×2): 600 mg via ORAL
  Filled 2020-09-07 (×2): qty 2

## 2020-09-07 MED ORDER — SODIUM CHLORIDE FLUSH 0.9 % IV SOLN
INTRAVENOUS | Status: AC
  Filled 2020-09-07: qty 10

## 2020-09-07 MED ORDER — DEXAMETHASONE SODIUM PHOSPHATE 10 MG/ML IJ SOLN
INTRAMUSCULAR | Status: AC
  Filled 2020-09-07: qty 1

## 2020-09-07 MED ORDER — ASPIRIN EC 81 MG PO TBEC
81.0000 mg | DELAYED_RELEASE_TABLET | Freq: Every day | ORAL | Status: DC
  Administered 2020-09-08: 81 mg via ORAL
  Filled 2020-09-07: qty 1

## 2020-09-07 MED ORDER — LIDOCAINE HCL (CARDIAC) PF 100 MG/5ML IV SOSY
PREFILLED_SYRINGE | INTRAVENOUS | Status: DC | PRN
  Administered 2020-09-07: 100 mg via INTRAVENOUS

## 2020-09-07 MED ORDER — ONDANSETRON HCL 4 MG/2ML IJ SOLN
4.0000 mg | Freq: Four times a day (QID) | INTRAMUSCULAR | Status: DC | PRN
  Administered 2020-09-07 – 2020-09-08 (×2): 4 mg via INTRAVENOUS
  Filled 2020-09-07 (×2): qty 2

## 2020-09-07 MED ORDER — HYOSCYAMINE SULFATE 0.125 MG PO TBDP
0.1250 mg | ORAL_TABLET | Freq: Four times a day (QID) | ORAL | Status: DC | PRN
  Filled 2020-09-07: qty 1

## 2020-09-07 MED ORDER — ROSUVASTATIN CALCIUM 10 MG PO TABS
10.0000 mg | ORAL_TABLET | Freq: Every day | ORAL | Status: DC
  Administered 2020-09-08: 10 mg via ORAL
  Filled 2020-09-07: qty 1

## 2020-09-07 MED ORDER — SUCCINYLCHOLINE CHLORIDE 20 MG/ML IJ SOLN
INTRAMUSCULAR | Status: DC | PRN
  Administered 2020-09-07: 200 mg via INTRAVENOUS

## 2020-09-07 MED ORDER — ONDANSETRON HCL 4 MG/2ML IJ SOLN
INTRAMUSCULAR | Status: DC | PRN
  Administered 2020-09-07: 4 mg via INTRAVENOUS

## 2020-09-07 MED ORDER — SUGAMMADEX SODIUM 200 MG/2ML IV SOLN
INTRAVENOUS | Status: DC | PRN
  Administered 2020-09-07: 200 mg via INTRAVENOUS

## 2020-09-07 MED ORDER — LEVOTHYROXINE SODIUM 50 MCG PO TABS
175.0000 ug | ORAL_TABLET | Freq: Every day | ORAL | Status: DC
  Administered 2020-09-08: 175 ug via ORAL
  Filled 2020-09-07: qty 1

## 2020-09-07 MED ORDER — HYDROMORPHONE HCL 2 MG PO TABS
ORAL_TABLET | ORAL | Status: AC
  Administered 2020-09-07: 2 mg
  Filled 2020-09-07: qty 1

## 2020-09-07 MED ORDER — MEPERIDINE HCL 50 MG/ML IJ SOLN: 6.2500 mg | INTRAMUSCULAR | Status: DC | PRN

## 2020-09-07 MED ORDER — ALUM & MAG HYDROXIDE-SIMETH 200-200-20 MG/5ML PO SUSP: 30.0000 mL | ORAL | Status: DC | PRN

## 2020-09-07 MED ORDER — LIDOCAINE HCL (PF) 1 % IJ SOLN
INTRAMUSCULAR | Status: DC | PRN
  Administered 2020-09-07: 15 mL

## 2020-09-07 MED ORDER — HYDROMORPHONE HCL 2 MG PO TABS: 2.0000 mg | ORAL_TABLET | ORAL | 0 refills | Status: DC | PRN

## 2020-09-07 MED ORDER — SODIUM CHLORIDE 0.9 % IV SOLN
75.0000 mL/h | INTRAVENOUS | Status: DC
  Administered 2020-09-07: 75 mL/h via INTRAVENOUS

## 2020-09-07 MED ORDER — LOSARTAN POTASSIUM 50 MG PO TABS
50.0000 mg | ORAL_TABLET | Freq: Every day | ORAL | Status: DC
  Administered 2020-09-08: 50 mg via ORAL
  Filled 2020-09-07: qty 1

## 2020-09-07 MED ORDER — DEXAMETHASONE SODIUM PHOSPHATE 10 MG/ML IJ SOLN
INTRAMUSCULAR | Status: DC | PRN
  Administered 2020-09-07: 5 mg via INTRAVENOUS

## 2020-09-07 MED ORDER — HYDROMORPHONE HCL 2 MG PO TABS
ORAL_TABLET | ORAL | Status: AC
  Filled 2020-09-07: qty 1

## 2020-09-07 MED ORDER — CHLORHEXIDINE GLUCONATE 0.12 % MT SOLN
OROMUCOSAL | Status: AC
  Filled 2020-09-07: qty 15

## 2020-09-07 MED ORDER — BUPIVACAINE HCL (PF) 0.25 % IJ SOLN
INTRAMUSCULAR | Status: AC
  Filled 2020-09-07: qty 30

## 2020-09-07 MED ORDER — SEVOFLURANE IN SOLN
RESPIRATORY_TRACT | Status: AC
  Filled 2020-09-07: qty 250

## 2020-09-07 MED ORDER — DOCUSATE SODIUM 100 MG PO CAPS
100.0000 mg | ORAL_CAPSULE | Freq: Two times a day (BID) | ORAL | Status: DC
  Administered 2020-09-07 – 2020-09-08 (×2): 100 mg via ORAL
  Filled 2020-09-07 (×2): qty 1

## 2020-09-07 MED ORDER — HYDROMORPHONE HCL 1 MG/ML IJ SOLN
INTRAMUSCULAR | Status: AC
  Filled 2020-09-07: qty 1

## 2020-09-07 MED ORDER — HYDROMORPHONE HCL 1 MG/ML IJ SOLN
INTRAMUSCULAR | Status: DC | PRN
  Administered 2020-09-07: 1 mg via INTRAVENOUS

## 2020-09-07 MED ORDER — CEFAZOLIN SODIUM-DEXTROSE 2-4 GM/100ML-% IV SOLN
INTRAVENOUS | Status: AC
  Filled 2020-09-07: qty 100

## 2020-09-07 MED ORDER — PROPOFOL 10 MG/ML IV BOLUS
INTRAVENOUS | Status: AC
  Filled 2020-09-07: qty 20

## 2020-09-07 MED ORDER — HYDROMORPHONE HCL 1 MG/ML PO LIQD: 1.0000 mg | Freq: Four times a day (QID) | ORAL | Status: DC | PRN

## 2020-09-07 MED ORDER — METHOCARBAMOL 500 MG PO TABS
500.0000 mg | ORAL_TABLET | Freq: Four times a day (QID) | ORAL | Status: DC | PRN
  Administered 2020-09-07 – 2020-09-08 (×2): 500 mg via ORAL
  Filled 2020-09-07 (×2): qty 1

## 2020-09-07 MED ORDER — ACETAMINOPHEN 325 MG PO TABS
325.0000 mg | ORAL_TABLET | Freq: Four times a day (QID) | ORAL | Status: DC | PRN
Start: 2020-09-08 — End: 2020-09-08

## 2020-09-07 MED ORDER — MONTELUKAST SODIUM 10 MG PO TABS
10.0000 mg | ORAL_TABLET | Freq: Every day | ORAL | Status: DC
  Administered 2020-09-07: 10 mg via ORAL
  Filled 2020-09-07: qty 1

## 2020-09-07 MED ORDER — FENTANYL CITRATE (PF) 250 MCG/5ML IJ SOLN
INTRAMUSCULAR | Status: AC
  Filled 2020-09-07: qty 5

## 2020-09-07 MED ORDER — OXYBUTYNIN CHLORIDE ER 5 MG PO TB24
10.0000 mg | ORAL_TABLET | Freq: Every day | ORAL | Status: DC
  Administered 2020-09-07: 10 mg via ORAL
  Filled 2020-09-07: qty 2

## 2020-09-07 MED ORDER — HYDRALAZINE HCL 20 MG/ML IJ SOLN
10.0000 mg | Freq: Once | INTRAMUSCULAR | Status: AC
  Administered 2020-09-07: 10 mg via INTRAVENOUS

## 2020-09-07 MED ORDER — PENTOSAN POLYSULFATE SODIUM 100 MG PO CAPS
100.0000 mg | ORAL_CAPSULE | Freq: Three times a day (TID) | ORAL | Status: DC
  Administered 2020-09-07 – 2020-09-08 (×2): 100 mg via ORAL
  Filled 2020-09-07 (×4): qty 1

## 2020-09-07 MED ORDER — ONDANSETRON HCL 4 MG PO TABS: 4.0000 mg | ORAL_TABLET | Freq: Three times a day (TID) | ORAL | 0 refills | Status: DC | PRN

## 2020-09-07 MED ORDER — PROPOFOL 10 MG/ML IV BOLUS
INTRAVENOUS | Status: DC | PRN
  Administered 2020-09-07: 200 mg via INTRAVENOUS

## 2020-09-07 MED ORDER — ONDANSETRON HCL 4 MG PO TABS: 4.0000 mg | ORAL_TABLET | Freq: Four times a day (QID) | ORAL | Status: DC | PRN

## 2020-09-07 MED ORDER — CEFAZOLIN SODIUM-DEXTROSE 2-4 GM/100ML-% IV SOLN
2.0000 g | Freq: Four times a day (QID) | INTRAVENOUS | Status: AC
  Administered 2020-09-07 – 2020-09-08 (×2): 2 g via INTRAVENOUS
  Filled 2020-09-07 (×2): qty 100

## 2020-09-07 MED ORDER — ACETAMINOPHEN 10 MG/ML IV SOLN
INTRAVENOUS | Status: AC
  Administered 2020-09-07: 1000 mg via INTRAVENOUS
  Filled 2020-09-07: qty 100

## 2020-09-07 MED ORDER — HYDROMORPHONE HCL 2 MG PO TABS
4.0000 mg | ORAL_TABLET | ORAL | Status: DC | PRN
  Administered 2020-09-08 (×3): 4 mg via ORAL
  Filled 2020-09-07 (×3): qty 2

## 2020-09-07 MED ORDER — ROCURONIUM BROMIDE 100 MG/10ML IV SOLN
INTRAVENOUS | Status: DC | PRN
  Administered 2020-09-07: 50 mg via INTRAVENOUS

## 2020-09-07 MED ORDER — BACLOFEN 10 MG PO TABS
10.0000 mg | ORAL_TABLET | Freq: Every day | ORAL | Status: DC
  Administered 2020-09-08: 10 mg via ORAL
  Filled 2020-09-07: qty 1

## 2020-09-07 MED ORDER — PROMETHAZINE HCL 25 MG/ML IJ SOLN: 6.2500 mg | INTRAMUSCULAR | Status: DC | PRN

## 2020-09-07 MED ORDER — HYDROMORPHONE HCL 2 MG PO TABS
2.0000 mg | ORAL_TABLET | Freq: Once | ORAL | Status: DC
  Filled 2020-09-07: qty 1

## 2020-09-07 SURGICAL SUPPLY — 69 items
ADAPTER IRRIG TUBE 2 SPIKE SOL (ADAPTER) ×4 IMPLANT
ADPR TBG 2 SPK PMP STRL ASCP (ADAPTER) ×2
ANCH SUT 5.5 KNTLS PEEK (Orthopedic Implant) ×2 IMPLANT
ANCHOR ALL-SUT Q-FIX 2.8 (Anchor) ×4 IMPLANT
ANCHOR SUT 5.5 MULTIFIX (Orthopedic Implant) ×4 IMPLANT
BUR RADIUS 4.0X18.5 (BURR) ×2 IMPLANT
BUR RADIUS 5.5 (BURR) ×2 IMPLANT
CANISTER SUCT LVC 12 LTR MEDI- (MISCELLANEOUS) ×2 IMPLANT
CANNULA 5.75X7 CRYSTAL CLEAR (CANNULA) ×4 IMPLANT
CANNULA PARTIAL THREAD 2X7 (CANNULA) ×2 IMPLANT
CANNULA TWIST IN 8.25X9CM (CANNULA) ×4 IMPLANT
CONNECTOR PERFECT PASSER (CONNECTOR) ×2 IMPLANT
COOLER POLAR GLACIER W/PUMP (MISCELLANEOUS) ×2 IMPLANT
COVER WAND RF STERILE (DRAPES) ×2 IMPLANT
DEVICE SUCT BLK HOLE OR FLOOR (MISCELLANEOUS) ×2 IMPLANT
DRAPE 3/4 80X56 (DRAPES) ×2 IMPLANT
DRAPE IMP U-DRAPE 54X76 (DRAPES) ×4 IMPLANT
DRAPE INCISE IOBAN 66X45 STRL (DRAPES) ×2 IMPLANT
DRAPE U-SHAPE 47X51 STRL (DRAPES) ×2 IMPLANT
DURAPREP 26ML APPLICATOR (WOUND CARE) ×6 IMPLANT
ELECT REM PT RETURN 9FT ADLT (ELECTROSURGICAL) ×2
ELECTRODE REM PT RTRN 9FT ADLT (ELECTROSURGICAL) ×1 IMPLANT
GAUZE SPONGE 4X4 12PLY STRL (GAUZE/BANDAGES/DRESSINGS) ×2 IMPLANT
GAUZE XEROFORM 1X8 LF (GAUZE/BANDAGES/DRESSINGS) ×2 IMPLANT
GLOVE SURG 9.0 ORTHO LTXF (GLOVE) ×6 IMPLANT
GLOVE SURG UNDER POLY LF SZ9 (GLOVE) ×2 IMPLANT
GOWN STRL REUS TWL 2XL XL LVL4 (GOWN DISPOSABLE) ×2 IMPLANT
GOWN STRL REUS W/ TWL LRG LVL3 (GOWN DISPOSABLE) ×1 IMPLANT
GOWN STRL REUS W/TWL LRG LVL3 (GOWN DISPOSABLE) ×2
IV LACTATED RINGER IRRG 3000ML (IV SOLUTION) ×16
IV LR IRRIG 3000ML ARTHROMATIC (IV SOLUTION) ×8 IMPLANT
KIT STABILIZATION SHOULDER (MISCELLANEOUS) ×2 IMPLANT
KIT SUTURE 2.8 Q-FIX DISP (MISCELLANEOUS) ×2 IMPLANT
KIT SUTURETAK 3.0 INSERT PERC (KITS) IMPLANT
KIT TURNOVER KIT A (KITS) ×2 IMPLANT
MANIFOLD NEPTUNE II (INSTRUMENTS) ×4 IMPLANT
MASK FACE SPIDER DISP (MASK) ×2 IMPLANT
MAT ABSORB  FLUID 56X50 GRAY (MISCELLANEOUS) ×4
MAT ABSORB FLUID 56X50 GRAY (MISCELLANEOUS) ×2 IMPLANT
NDL SAFETY ECLIPSE 18X1.5 (NEEDLE) ×1 IMPLANT
NEEDLE HYPO 18GX1.5 SHARP (NEEDLE) ×2
NEEDLE HYPO 22GX1.5 SAFETY (NEEDLE) ×2 IMPLANT
NS IRRIG 500ML POUR BTL (IV SOLUTION) ×2 IMPLANT
PACK ARTHROSCOPY SHOULDER (MISCELLANEOUS) ×2 IMPLANT
PAD ARMBOARD 7.5X6 YLW CONV (MISCELLANEOUS) ×4 IMPLANT
PAD WRAPON POLAR SHDR XLG (MISCELLANEOUS) ×1 IMPLANT
PASSER SUT FIRSTPASS SELF (INSTRUMENTS) ×2 IMPLANT
SET TUBE SUCT SHAVER OUTFL 24K (TUBING) ×2 IMPLANT
SET TUBE TIP INTRA-ARTICULAR (MISCELLANEOUS) ×2 IMPLANT
STRIP CLOSURE SKIN 1/2X4 (GAUZE/BANDAGES/DRESSINGS) ×2 IMPLANT
SUT ETHILON 4-0 (SUTURE) ×2
SUT ETHILON 4-0 FS2 18XMFL BLK (SUTURE) ×1
SUT LASSO 90 DEG SD STR (SUTURE) IMPLANT
SUT MNCRL 4-0 (SUTURE) ×2
SUT MNCRL 4-0 27XMFL (SUTURE) ×1
SUT PDS AB 0 CT1 27 (SUTURE) ×6 IMPLANT
SUT PERFECTPASSER WHITE CART (SUTURE) ×4 IMPLANT
SUT SMART STITCH CARTRIDGE (SUTURE) ×8 IMPLANT
SUT ULTRABRAID 2 COBRAID 38 (SUTURE) IMPLANT
SUT VIC AB 0 CT1 36 (SUTURE) ×6 IMPLANT
SUT VIC AB 2-0 CT2 27 (SUTURE) ×2 IMPLANT
SUTURE ETHLN 4-0 FS2 18XMF BLK (SUTURE) ×1 IMPLANT
SUTURE MNCRL 4-0 27XMF (SUTURE) ×1 IMPLANT
SYR 10ML LL (SYRINGE) ×2 IMPLANT
TAPE MICROFOAM 4IN (TAPE) ×2 IMPLANT
TUBING ARTHRO INFLOW-ONLY STRL (TUBING) ×2 IMPLANT
TUBING CONNECTING 10 (TUBING) ×2 IMPLANT
WAND HAND CNTRL MULTIVAC 90 (MISCELLANEOUS) ×2 IMPLANT
WRAPON POLAR PAD SHDR XLG (MISCELLANEOUS) ×2

## 2020-09-07 NOTE — Progress Notes (Signed)
MD notified of pt complaining of pain of a 10 out of 10 after receiving dilaudid 4 mg po. Her pain has not improved. New observation orders in system placed by MD. Continue to monitor.

## 2020-09-07 NOTE — Transfer of Care (Signed)
Immediate Anesthesia Transfer of Care Note  Patient: Joanna James  Procedure(s) Performed: RIGHT SHOULDER ARTHROSCOPY WITH MINI-OPEN ROTATOR CUFF REPAIR AND SUBACROMIAL DECOMPRESSION, DISTAL CLAVICLE EXCISION (Right )  Patient Location: PACU  Anesthesia Type:General  Level of Consciousness: drowsy and patient cooperative  Airway & Oxygen Therapy: Patient Spontanous Breathing  Post-op Assessment: Report given to RN and Post -op Vital signs reviewed and stable  Post vital signs: Reviewed and stable  Last Vitals:  Vitals Value Taken Time  BP 154/81 09/07/20 1418  Temp 35.8 C 09/07/20 1418  Pulse 76 09/07/20 1422  Resp 11 09/07/20 1422  SpO2 94 % 09/07/20 1422  Vitals shown include unvalidated device data.  Last Pain:  Vitals:   09/07/20 1418  TempSrc:   PainSc: 0-No pain         Complications: No complications documented.

## 2020-09-07 NOTE — Discharge Instructions (Signed)

## 2020-09-07 NOTE — Anesthesia Postprocedure Evaluation (Signed)
Anesthesia Post Note  Patient: Joanna James  Procedure(s) Performed: RIGHT SHOULDER ARTHROSCOPY WITH MINI-OPEN ROTATOR CUFF REPAIR AND SUBACROMIAL DECOMPRESSION, DISTAL CLAVICLE EXCISION (Right )  Patient location during evaluation: PACU Anesthesia Type: General Level of consciousness: awake and alert and oriented Pain management: pain level controlled Vital Signs Assessment: post-procedure vital signs reviewed and stable Respiratory status: spontaneous breathing, nonlabored ventilation and respiratory function stable Cardiovascular status: blood pressure returned to baseline and stable Postop Assessment: no signs of nausea or vomiting Anesthetic complications: no   No complications documented.   Last Vitals:  Vitals:   09/07/20 1507 09/07/20 1515  BP:  (!) 184/88  Pulse: 80 80  Resp: 13 (!) 22  Temp:    SpO2: 99% 100%    Last Pain:  Vitals:   09/07/20 1507  TempSrc:   PainSc: Asleep                 Rahman Ferrall

## 2020-09-07 NOTE — Op Note (Addendum)
09/07/2020  2:31 PM  PATIENT:  Joanna James  60 y.o. female  PRE-OPERATIVE DIAGNOSIS:  Right Shoulder supraspinatus tear, subacromial impingement and acromioclavicular joint arthrosis  POST-OPERATIVE DIAGNOSIS:  Right Shoulder supraspinatus tear, high-grade partial-thickness tear of the biceps tendon, subacromial impingement and acromioclavicular joint arthrosis   PROCEDURE:  Procedure(s): RIGHT SHOULDER ARTHROSCOPIC BICEPS TENOTOMY, SUBACROMIAL DECOMPRESSION, DISTAL CLAVICLE EXCISION AND MINI-OPEN ROTATOR CUFF REPAIR   SURGEON:  Surgeon(s) and Role:    Thornton Park, MD - Primary  ANESTHESIA:   local and general   PREOPERATIVE INDICATIONS:  Joanna James is a  60 y.o. female with a diagnosis of Right Shoulder supraspinatus tear, confirmed by CT arthrogram, who failed conservative measures and wished to proceed with surgical fixation of her rotator cuff tear.    The risks benefits and alternatives were discussed with the patient preoperatively including but not limited to the risks of infection, bleeding, nerve injury, persistent pain or weakness, failure of the hardware, re-tear of the rotator cuff and the need for further surgery. Medical risks include DVT and pulmonary embolism, myocardial infarction, stroke, pneumonia, respiratory failure and death. Patient understood these risks and wished to proceed.  OPERATIVE IMPLANTS: [place] MultiFix anchors x 2 & [employer] Q Fix anchors x 2  OPERATIVE PROCEDURE: The patient was met in the preoperative area. The right shoulder was signed with the word yes and my initials according the hospital's correct site of surgery protocol. The patient is brought to the OR and underwent general endotracheal intubation by the anesthesia service.  Patient was not felt to be a candidate for an interscalene block given her history of significant sleep apnea.  The patient was placed in a beachchair position. A spider  arm positioner was used for this case. Examination under anesthesia revealed no significant loss of passive range of motion.  The patient was prepped and draped in a sterile fashion. A timeout was performed to verify the patient's name, date of birth, medical record number, correct site of surgery and correct procedure to be performed there was also used to verify the patient received antibiotics that all appropriate instruments, implants and radiographs studies were available in the room. Once all in attendance were in agreement case began.  Bony landmarks were drawn out with a surgical marker along with proposed arthroscopy incisions. These were pre-injected with 1% lidocaine plain. An 11 blade was used to establish a posterior portal through which the arthroscope was placed in the glenohumeral joint. A full diagnostic examination of the shoulder was performed. The anterior portal was established under direct visualization with an 18-gauge spinal needle.  A 5.75 mm arthroscopic cannula was placed through the anterior portal.   The intra-articular portion of the biceps tendon was found to have a high-grade partial tear with severe fraying involving the majority of the tendon thickness.  It was not felt that the patient could have a biceps tenodesis within the intratubuerous groove given the extent of fraying of the tendon, therefore the decision was made to perform a tenotomy. An arthroscopic scissor was used to release the biceps tendon off the superior labrum. The arthroscopic shaver was then used to debride the frayed edges of the labrum. There were no anterior or superior labral tears seen.  The subscapularis tendon was intact. Patient had a full-thickness tear involving the supraspinatus with retraction. There were no loose bodies within the inferior recess and no evidence of HAGL lesion.  The arthroscope was then placed in the  subacromial space. A lateral portal was then established using an 18-gauge  spinal needle for localization.   The greater tuberosity was debrided using a 5.5 mm resector shaver blade to remove all remaining foreign fibers of the rotator cuff.  Debridement was performed until punctate bleeding was seen at the greater tuberosity footprint, which will allow for rotator cuff healing.  Extensive bursitis was encountered and debrided using a 4-0 resector shaver blade and a 90 ArthroCare wand from the lateral portal. A subacromial decompression was also performed using a 5.5 mm burr from the lateral portal. A 4.5 mm resector shaver blade was then placed through the anterior portal and distal clavicle excision was performed. Four ArthroCare Perfect Pass sutures were placed in the lateral border of the rotator cuff tear. All arthroscopic instruments were then removed and the mini-open portion of the procedure began.   A saber-type incision was made along the lateral border of the acromion. The deltoid muscle was identified and split in line with its fibers which allowed visualization of the rotator cuff. The Perfect Pass sutures previously placed in the lateral border of the rotator cuff werealso brought out through the deltoid split. Two Q-Fix anchors were then placed at the articular margin of the humeral head and greater tuberosity. The four suture limbs of two Q Fix anchors were passed medially through the rotator cuff using a first pass suture passer. The Perfect Pass sutures from the lateral border of the rotator cuff were then anchored to thegreater tuberosity of the humeral head using two Multifix anchors. These anchors were tensioned to allow for anatomic reduction of the rotator cuff to the greater tuberosity footprint. The medial row repair was then completed using an arthroscopic knot tying technique with the Q fix anchor sutures. Once all sutures were tied down, arthroscopic images of the double row repair were taken with the arthroscope externally through the deltoid  split.  All incisions were copiously irrigated. The deltoid fascia was repaired using a 0 Vicryl suturean interrupted fashion. The subcutaneous tissue of all incisions were closed with a 2-0 Vicryl. Skin closure for the arthroscopic incisions was performed with 4-0 nylon. The skin edges of the saber incision were approximated with a running 4-0 undyed Monocryl. 0.25%  Marcaine plain was injected into the subacromial space and at the injection sites.  A dry sterile dressing including Steri-Strips was applied . The patient was placed in an abduction sling, with a Polar Care sleeve.  All sharp and instrument counts were correct at the conclusion of the case. I was scrubbed and present for the entire case. I spoke with the patient's mother in the post-op consultation room and informed her that the case had been performed without complication and the patient was stable in recovery room.  I reviewed with her the postop instructions and answered all her questions.    Timoteo Gaul, MD

## 2020-09-07 NOTE — Anesthesia Preprocedure Evaluation (Signed)
Anesthesia Evaluation  Patient identified by MRN, date of birth, ID band Patient awake    Reviewed: Allergy & Precautions, NPO status , Patient's Chart, lab work & pertinent test results  History of Anesthesia Complications (+) DIFFICULT AIRWAY and history of anesthetic complications  Airway Mallampati: II  TM Distance: >3 FB Neck ROM: Full    Dental  (+) Poor Dentition   Pulmonary sleep apnea (noncompliant with CPAP) , neg COPD,    breath sounds clear to auscultation- rhonchi (-) wheezing      Cardiovascular hypertension, Pt. on medications (-) CAD, (-) Past MI, (-) Cardiac Stents and (-) CABG  Rhythm:Regular Rate:Normal - Systolic murmurs and - Diastolic murmurs    Neuro/Psych neg Seizures negative neurological ROS  negative psych ROS   GI/Hepatic Neg liver ROS, GERD  ,  Endo/Other  negative endocrine ROSneg diabetes  Renal/GU Renal InsufficiencyRenal disease     Musculoskeletal  (+) Fibromyalgia -  Abdominal (+) + obese,   Peds  Hematology negative hematology ROS (+)   Anesthesia Other Findings Past Medical History: No date: Chronic pain syndrome No date: Complication of anesthesia     Comment:  placing the ET was difficult per patient No date: Fibromyalgia No date: GERD (gastroesophageal reflux disease) No date: Gout No date: Hypertension No date: IC (interstitial cystitis) No date: Pneumonia     Comment:  as a child No date: Sleep apnea     Comment:  is not using her cpap No date: Spinal stenosis   Reproductive/Obstetrics                             Anesthesia Physical Anesthesia Plan  ASA: III  Anesthesia Plan: General   Post-op Pain Management:    Induction: Intravenous  PONV Risk Score and Plan: 2 and Ondansetron and Dexamethasone  Airway Management Planned: Oral ETT  Additional Equipment:   Intra-op Plan:   Post-operative Plan: Extubation in OR  Informed  Consent: I have reviewed the patients History and Physical, chart, labs and discussed the procedure including the risks, benefits and alternatives for the proposed anesthesia with the patient or authorized representative who has indicated his/her understanding and acceptance.     Dental advisory given  Plan Discussed with: CRNA and Anesthesiologist  Anesthesia Plan Comments:         Anesthesia Quick Evaluation

## 2020-09-07 NOTE — Plan of Care (Signed)

## 2020-09-07 NOTE — Anesthesia Procedure Notes (Signed)
Procedure Name: Intubation Date/Time: 09/07/2020 10:56 AM Performed by: Clyde Lundborg, CRNA Pre-anesthesia Checklist: Patient identified, Emergency Drugs available, Suction available and Patient being monitored Patient Re-evaluated:Patient Re-evaluated prior to induction Oxygen Delivery Method: Circle system utilized Preoxygenation: Pre-oxygenation with 100% oxygen Induction Type: IV induction Ventilation: Mask ventilation without difficulty Laryngoscope Size: McGraph and 3 Grade View: Grade II Tube type: Oral Tube size: 7.0 mm Number of attempts: 1 Airway Equipment and Method: Stylet and Video-laryngoscopy Placement Confirmation: ETT inserted through vocal cords under direct vision,  positive ETCO2,  breath sounds checked- equal and bilateral and CO2 detector Secured at: 21 cm Tube secured with: Tape Dental Injury: Teeth and Oropharynx as per pre-operative assessment

## 2020-09-07 NOTE — H&P (Signed)
PREOPERATIVE H&P  Chief Complaint: Right Shoulder Rotator Cuff Tear  HPI: Joanna James is a 60 y.o. female who presents for preoperative history and physical with a diagnosis of Right Shoulder Rotator Cuff Tear.  Patient sustained a fall injuring her right shoulder.  A CT arthrogram has confirmed a full-thickness rotator cuff tear involving the supraspinatus.  Symptoms of pain, weakness and limited range of motion are significantly impairing activities of daily living.  Patient has failed nonoperative management and wished to proceed with surgical fixation of her rotator cuff tear.   Past Medical History:  Diagnosis Date  . Chronic pain syndrome   . Complication of anesthesia    placing the ET was difficult per patient  . Fibromyalgia   . GERD (gastroesophageal reflux disease)   . Gout   . Hypertension   . IC (interstitial cystitis)   . Pneumonia    as a child  . Sleep apnea    is not using her cpap  . Spinal stenosis    Past Surgical History:  Procedure Laterality Date  . ABDOMINAL HYSTERECTOMY    . APPENDECTOMY    . BLADDER SUSPENSION    . CYSTOSCOPY WITH STENT PLACEMENT Left 08/13/2016   Procedure: CYSTOSCOPY WITH STENT PLACEMENT;  Surgeon: Bjorn Pippin, MD;  Location: [facility];  Service: Urology;  Laterality: Left;  . ECTOPIC PREGNANCY SURGERY    . meniscus tear    . ROTATOR CUFF REPAIR    . THYROIDECTOMY    . TUBAL LIGATION     Social History   Socioeconomic History  . Marital status: Divorced    Spouse name: Not on file  . Number of children: Not on file  . Years of education: Not on file  . Highest education level: Not on file  Occupational History  . Not on file  Tobacco Use  . Smoking status: Never Smoker  . Smokeless tobacco: Never Used  Substance and Sexual Activity  . Alcohol use: Yes    Comment: occasional  . Drug use: No  . Sexual activity: Not on file  Other Topics Concern  . Not on file  Social History Narrative   Lives alone    Social Determinants of Health   Financial Resource Strain: Not on file  Food Insecurity: Not on file  Transportation Needs: Not on file  Physical Activity: Not on file  Stress: Not on file  Social Connections: Not on file   Family History  Problem Relation Age of Onset  . Breast cancer Neg Hx    Allergies  Allergen Reactions  . Amoxicillin Nausea And Vomiting  . Azithromycin Nausea And Vomiting  . Ciprofloxacin Nausea And Vomiting    "Cannot take by mouth, OK by IV"  . Hydrocodone-Acetaminophen Nausea And Vomiting  . Oxycodone Nausea And Vomiting  . Sulfa Antibiotics Nausea And Vomiting   Prior to Admission medications   Medication Sig Start Date End Date Taking? Authorizing Provider  amLODipine (NORVASC) 5 MG tablet Take 5 mg by mouth daily.   Yes [provider]  aspirin EC 81 MG tablet Take 81 mg by mouth daily. 02/23/14  Yes [provider]  baclofen (LIORESAL) 10 MG tablet Take 10 mg by mouth daily.   Yes [provider]  bupivacaine (MARCAINE) 0.5 % SOLN injection 15 mLs See admin instructions. Mix with Heparin and sodium bicarbonate (5 mEq) and inject into bladder catheter 09/19/18  Yes [provider]  celecoxib (CELEBREX) 100 MG capsule Take 100 mg by mouth  2 (two) times daily.   Yes [provider]  clobetasol ointment (TEMOVATE) 0.05 % Apply 1 application topically 2 (two) times daily as needed (irritation). 01/29/17  Yes [provider]  diclofenac (VOLTAREN) 75 MG EC tablet Take 75 mg by mouth daily.   Yes [provider]  famotidine (PEPCID) 20 MG tablet Take 20 mg by mouth 2 (two) times daily.   Yes [provider]  gabapentin (NEURONTIN) 300 MG capsule Take 600 mg by mouth 3 (three) times daily.   Yes [provider]  Galcanezumab-gnlm (EMGALITY) 120 MG/ML SOSY Inject 120 mg into the skin every 28 (twenty-eight) days.   Yes [provider]  hydrOXYzine (ATARAX/VISTARIL) 25 MG  tablet Take 25 mg by mouth 2 (two) times daily.   Yes [provider]  hyoscyamine (ANASPAZ) 0.125 MG TBDP disintergrating tablet Place 1 tablet (0.125 mg total) under the tongue every 6 (six) hours as needed for cramping. 03/05/17  Yes Gouru, Deanna Artis, MD  levothyroxine (SYNTHROID) 175 MCG tablet Take 175 mcg by mouth daily before breakfast.   Yes [provider]  lidocaine (XYLOCAINE) 2 % jelly Place 1 application into the urethra as needed (local anesthesia).   Yes [provider]  linaclotide (LINZESS) 72 MCG capsule Take 72 mcg by mouth daily before breakfast.   Yes [provider]  losartan (COZAAR) 50 MG tablet Take 50 mg by mouth daily.   Yes [provider]  montelukast (SINGULAIR) 10 MG tablet Take 10 mg by mouth at bedtime.   Yes [provider]  oxybutynin (DITROPAN-XL) 10 MG 24 hr tablet Take 10 mg by mouth at bedtime.   Yes [provider]  pentosan polysulfate (ELMIRON) 100 MG capsule Take 100 mg by mouth 3 (three) times daily.   Yes [provider]  promethazine (PHENERGAN) 12.5 MG tablet Take 12.5 mg by mouth every 6 (six) hours as needed for nausea or vomiting.   Yes [provider]  rosuvastatin (CRESTOR) 10 MG tablet Take 10 mg by mouth daily.   Yes [provider]  traMADol (ULTRAM) 50 MG tablet Take 1 tablet (50 mg total) by mouth 3 (three) times daily as needed for moderate pain or severe pain. 06/22/16  Yes Enedina Finner, MD  ustekinumab (STELARA) 90 MG/ML SOSY injection Inject 90 mg into the skin every 3 (three) months.   Yes [provider]     Positive ROS: All other systems have been reviewed and were otherwise negative with the exception of those mentioned in the HPI and as above.  Physical Exam: General: Alert, no acute distress Cardiovascular: Regular rate and rhythm, no murmurs rubs or gallops.  No pedal edema Respiratory: Clear to auscultation bilaterally, no wheezes rales  or rhonchi. No cyanosis, no use of accessory musculature GI: No organomegaly, abdomen is soft and non-tender nondistended with positive bowel sounds. Skin: Skin intact, no lesions within the operative field. Neurologic: Sensation intact distally Psychiatric: Patient is competent for consent with normal mood and affect Lymphatic: No cervical lymphadenopathy  MUSCULOSKELETAL: Right shoulder: Patient can forward elevate and abduct to approximately 80 degrees.  She demonstrates weakness of shoulder abduction, but does not exhibit significant weakness of external rotation or internal rotation.  Patient has positive impingement signs but no apprehension or instability.  She has full digital wrist and elbow range of motion, intact sensation light touch and a palpable radial pulse.  Assessment: Right Shoulder Rotator Cuff Tear  Plan: Plan for Procedure(s): RIGHT SHOULDER ARTHROSCOPIC SUBACROMIAL  DECOMPRESSION, DISTAL CLAVICLE EXCISION WITH MINI-OPEN ROTATOR CUFF REPAIR   I reviewed the details of the operation as well as the postoperative course with the patient.  A preop history and physical was performed at the bedside this morning.  Patient was deemed not a candidate for an interscalene block by the anesthesia service.  Patient has a history of chronic pain.  I discussed the risks and benefits of surgery. The risks include but are not limited to infection, bleeding, nerve or blood vessel injury, joint stiffness or loss of motion, persistent pain especially given her history of chronic pain, weakness or instability, retear of the rotator cuff, failure of the repair, hardware failure and the need for further surgery. Medical risks include but are not limited to DVT and pulmonary embolism, myocardial infarction, stroke, pneumonia, respiratory failure and death. Patient understood these risks and wished to proceed.     Juanell Fairly, MD   09/07/2020 10:36 AM

## 2020-09-08 ENCOUNTER — Encounter: Payer: Self-pay | Admitting: Orthopedic Surgery

## 2020-09-08 DIAGNOSIS — S46021A Laceration of muscle(s) and tendon(s) of the rotator cuff of right shoulder, initial encounter: Secondary | ICD-10-CM | POA: Diagnosis not present

## 2020-09-08 NOTE — Evaluation (Signed)
Physical Therapy Evaluation Patient Details Name: Joanna James MRN: [id number] DOB: [birth year] Today's Date: 09/08/2020   History of Present Illness  Pt is a 60 y.o. female s/p R shoulder arthroscopy with mini-open rotator cuff repair and subacromial decompression, distal clavicle excision R 09/07/20.  Pt had sustained a fall injuring R shoulder (dx with R shoulder rotator cuff tear--full-thickness rotator cuff tear involving the supraspinatus; high grade partial-thickness tear of biceps tendon, subacromial impingement and acromioclavicular joint arthrosis).  PMH includes sleep apnea, htn, renal insufficiency, fibromyalgia, chronic pain syndrome, gout, PNA, spinal stenosis, meniscus tear.  Clinical Impression  Prior to hospital admission, pt was independent with ambulation; lives alone but currently pt's parents temporarily staying with pt (while their home is being remodeled) and can assist pt some; has ramp to enter 1 level home with railings but pt prefers to use multiple steps to enter home because she had slipped on ramp in December which caused her R shoulder injury.  Currently pt is SBA with bed mobility (HOB elevated); CGA with transfers; and CGA ambulating 120 feet (pt holding onto railing with L UE in hallway on way back to room d/t R UE/shoulder pain).  Pt's R shoulder pain 9/10 at rest beginning of session and 10/10 at rest end of session (nurse notified and brought pt pain medication).  Pt would benefit from skilled PT to address noted impairments and functional limitations (see below for any additional details).  Upon hospital discharge, pt would benefit from HHPT and assist from family as needed (pt reports her mother can assist with meals, cleaning, etc).    Follow Up Recommendations Home health PT    Equipment Recommendations  Cane    Recommendations for Other Services       Precautions / Restrictions Precautions Precautions: Shoulder;Fall Shoulder Interventions:  Shoulder sling/immobilizer;Shoulder abduction pillow Restrictions Weight Bearing Restrictions: Yes RUE Weight Bearing: Non weight bearing Other Position/Activity Restrictions: No lifting 12-16 weeks.      Mobility  Bed Mobility Overal bed mobility: Needs Assistance Bed Mobility: Supine to Sit;Sit to Supine     Supine to sit: Supervision;HOB elevated Sit to supine: Supervision;HOB elevated   General bed mobility comments: increased time to perform on own d/t R shoulder pain    Transfers Overall transfer level: Needs assistance Equipment used: None Transfers: Sit to/from Stand Sit to Stand: Min guard         General transfer comment: pt holding onto bedrail for support with transfers from bed (with non-op UE)  Ambulation/Gait Ambulation/Gait assistance: Min guard Gait Distance (Feet): 120 Feet Assistive device:  (railing in hallway with non-op UE)   Gait velocity: decreased   General Gait Details: wider BOS; decreased B LE step length/foot clearance; mild increased B lateral sway; no UE support except for 40 feet in hallway on way back to room pt using non-op UE to hold onto railing for support  Stairs            Wheelchair Mobility    Modified Rankin (Stroke Patients Only)       Balance Overall balance assessment: Needs assistance Sitting-balance support: No upper extremity supported;Feet supported Sitting balance-Leahy Scale: Good Sitting balance - Comments: steady sitting reaching within BOS with non-op UE   Standing balance support: No upper extremity supported Standing balance-Leahy Scale: Fair Standing balance comment: steady static standing  Pertinent Vitals/Pain Pain Assessment: 0-10 Pain Score: 10-Worst pain ever Pain Location: R shoulder Pain Descriptors / Indicators: Constant;Discomfort;Grimacing;Guarding;Sharp Pain Intervention(s): Limited activity within patient's tolerance;Monitored during  session;Premedicated before session;Repositioned;Patient requesting pain meds-RN notified;RN gave pain meds during session;Other (comment) (polar care applied and activated)  Vitals (HR and O2 on room air) stable and WFL throughout treatment session.    Home Living Family/patient expects to be discharged to:: Private residence Living Arrangements: Parent (parents staying with pt temporarily while their home is being remodeled)   Type of Home: House Home Access: Stairs to enter;Ramped entrance (pt reports ramp has B railings but gets slippery when wet (is how pt fell on December 19th injuring her R shoulder)) Entrance Stairs-Rails: None Entrance Stairs-Number of Steps: 2 then 2-3 more steps to enter Home Layout: One level Home Equipment: Environmental consultant - 2 wheels      Prior Function Level of Independence: Independent               Hand Dominance        Extremity/Trunk Assessment   Upper Extremity Assessment Upper Extremity Assessment: RUE deficits/detail (L UE WFL) RUE Deficits / Details: R UE in immobilizer sling with abduction pillow RUE: Unable to fully assess due to immobilization    Lower Extremity Assessment Lower Extremity Assessment: Overall WFL for tasks assessed    Cervical / Trunk Assessment Cervical / Trunk Assessment: Normal  Communication   Communication: No difficulties  Cognition Arousal/Alertness: Awake/alert Behavior During Therapy: WFL for tasks assessed/performed Overall Cognitive Status: Within Functional Limits for tasks assessed                                        General Comments   Nursing cleared pt for participation in physical therapy.  Pt agreeable to PT session.    Exercises  Gait training   Assessment/Plan    PT Assessment Patient needs continued PT services  PT Problem List Decreased strength;Decreased activity tolerance;Decreased balance;Decreased mobility;Decreased knowledge of use of DME;Decreased knowledge of  precautions;Pain;Decreased skin integrity       PT Treatment Interventions DME instruction;Gait training;Stair training;Functional mobility training;Therapeutic activities;Therapeutic exercise;Balance training;Patient/family education    PT Goals (Current goals can be found in the Care Plan section)  Acute Rehab PT Goals Patient Stated Goal: to go home; improve pain PT Goal Formulation: With patient Time For Goal Achievement: 09/22/20 Potential to Achieve Goals: Good    Frequency BID   Barriers to discharge        Co-evaluation               AM-PAC PT "6 Clicks" Mobility  Outcome Measure Help needed turning from your back to your side while in a flat bed without using bedrails?: A Little Help needed moving from lying on your back to sitting on the side of a flat bed without using bedrails?: A Little Help needed moving to and from a bed to a chair (including a wheelchair)?: A Little Help needed standing up from a chair using your arms (e.g., wheelchair or bedside chair)?: A Little Help needed to walk in hospital room?: A Little Help needed climbing 3-5 steps with a railing? : A Little 6 Click Score: 18    End of Session Equipment Utilized During Treatment: Gait belt;Other (comment) (R UE sling/immobilizer with abduction pillow) Activity Tolerance: Patient limited by pain Patient left: in bed;with call bell/phone within reach;with bed alarm  set;with nursing/sitter in room;Other (comment) (R UE in sling/immobilizer with abduction pillow and supported with pillows) Nurse Communication: Mobility status;Patient requests pain meds;Precautions;Weight bearing status PT Visit Diagnosis: Unsteadiness on feet (R26.81);Other abnormalities of gait and mobility (R26.89);Muscle weakness (generalized) (M62.81);History of falling (Z91.81);Difficulty in walking, not elsewhere classified (R26.2);Pain Pain - Right/Left: Right Pain - part of body: Shoulder    Time: 0623-7628 PT Time  Calculation (min) (ACUTE ONLY): 30 min   Charges:   PT Evaluation $PT Eval Low Complexity: 1 Low PT Treatments $Gait Training: 8-22 mins       Hendricks Limes, PT 09/08/20, 10:48 AM

## 2020-09-08 NOTE — Discharge Summary (Signed)
Physician Discharge Summary  Patient ID: Joanna James MRN: [id number] DOB/AGE: Oct 28, 1960 60 y.o.  Admit date: 09/07/2020 Discharge date: 09/08/2020  Admission Diagnoses:  Right Shoulder Rotator Cuff Tear <principal problem not specified>  Discharge Diagnoses:  Right Shoulder Rotator Cuff Tear Active Problems:   S/P right rotator cuff repair   Past Medical History:  Diagnosis Date  . Chronic pain syndrome   . Complication of anesthesia    placing the ET was difficult per patient  . Fibromyalgia   . GERD (gastroesophageal reflux disease)   . Gout   . Hypertension   . IC (interstitial cystitis)   . Pneumonia    as a child  . Sleep apnea    is not using her cpap  . Spinal stenosis     Surgeries: Procedure(s): RIGHT SHOULDER ARTHROSCOPY WITH MINI-OPEN ROTATOR CUFF REPAIR AND SUBACROMIAL DECOMPRESSION, DISTAL CLAVICLE EXCISION on 09/07/2020   Consultants (if any):   Discharged Condition: Improved  Hospital Course: Joanna James is an 60 y.o. female who was admitted 09/07/2020 with a diagnosis of  Right Shoulder Rotator Cuff Tear <principal problem not specified> and went to the operating room on 09/07/2020 and underwent a successful right shoulder rotator cuff repair.   Patient was admitted postoperatively for pain control.  Her pain is improved overnight and is ready for discharge home.  She has been clinically stable throughout her hospitalization.  She was given perioperative antibiotics:  Anti-infectives (From admission, onward)   Start     Dose/Rate Route Frequency Ordered Stop   09/07/20 2000  ceFAZolin (ANCEF) IVPB 2g/100 mL premix        2 g 200 mL/hr over 30 Minutes Intravenous Every 6 hours 09/07/20 1827 09/08/20 0236   09/07/20 0923  ceFAZolin (ANCEF) 2-4 GM/100ML-% IVPB       Note to Pharmacy: Desma Paganini   : cabinet override      09/07/20 0923 09/07/20 1106   09/07/20 0600  ceFAZolin (ANCEF) IVPB 2g/100 mL premix        2 g 200 mL/hr  over 30 Minutes Intravenous On call to O.R. 09/06/20 2228 09/07/20 1105    .  She was given sequential compression devices, early ambulation, and lovenox for DVT prophylaxis.  She benefited maximally from the hospital stay and there were no complications.    Recent vital signs:  Vitals:   09/08/20 0440 09/08/20 0858  BP: (!) 139/92 (!) 144/89  Pulse: 81 82  Resp: 16 16  Temp: 99 F (37.2 C) 98.8 F (37.1 C)  SpO2: 97% 94%    Recent laboratory studies:  Lab Results  Component Value Date   HGB 12.9 09/03/2020   HGB 13.0 10/10/2019   HGB 10.8 (L) 03/02/2017   Lab Results  Component Value Date   WBC 7.7 09/03/2020   PLT 325 09/03/2020   Lab Results  Component Value Date   INR 1.0 09/03/2020   Lab Results  Component Value Date   NA 141 09/03/2020   K 4.0 09/03/2020   CL 103 09/03/2020   CO2 29 09/03/2020   BUN 15 09/03/2020   CREATININE 1.14 (H) 09/03/2020   GLUCOSE 94 09/03/2020    Discharge Medications:   Allergies as of 09/08/2020      Reactions   Amoxicillin Nausea And Vomiting   Azithromycin Nausea And Vomiting   Ciprofloxacin Nausea And Vomiting   "Cannot take by mouth, OK by IV"   Hydrocodone-acetaminophen Nausea And Vomiting   Oxycodone Nausea And Vomiting  Sulfa Antibiotics Nausea And Vomiting      Medication List    STOP taking these medications   diclofenac 75 MG EC tablet Commonly known as: VOLTAREN   traMADol 50 MG tablet Commonly known as: ULTRAM     TAKE these medications   amLODipine 5 MG tablet Commonly known as: NORVASC Take 5 mg by mouth daily.   aspirin EC 81 MG tablet Take 81 mg by mouth daily.   baclofen 10 MG tablet Commonly known as: LIORESAL Take 10 mg by mouth daily.   bupivacaine 0.5 % Soln injection Commonly known as: MARCAINE 15 mLs See admin instructions. Mix with Heparin and sodium bicarbonate (5 mEq) and inject into bladder catheter   celecoxib 100 MG capsule Commonly known as: CELEBREX Take 100 mg by  mouth 2 (two) times daily.   clobetasol ointment 0.05 % Commonly known as: TEMOVATE Apply 1 application topically 2 (two) times daily as needed (irritation).   Emgality 120 MG/ML Sosy Generic drug: Galcanezumab-gnlm Inject 120 mg into the skin every 28 (twenty-eight) days.   famotidine 20 MG tablet Commonly known as: PEPCID Take 20 mg by mouth 2 (two) times daily.   gabapentin 300 MG capsule Commonly known as: NEURONTIN Take 600 mg by mouth 3 (three) times daily.   HYDROmorphone 2 MG tablet Commonly known as: Dilaudid Take 1-2 tablets (2-4 mg total) by mouth every 4 (four) hours as needed for severe pain.   hydrOXYzine 25 MG tablet Commonly known as: ATARAX/VISTARIL Take 25 mg by mouth 2 (two) times daily.   hyoscyamine 0.125 MG Tbdp disintergrating tablet Commonly known as: ANASPAZ Place 1 tablet (0.125 mg total) under the tongue every 6 (six) hours as needed for cramping.   levothyroxine 175 MCG tablet Commonly known as: SYNTHROID Take 175 mcg by mouth daily before breakfast.   lidocaine 2 % jelly Commonly known as: XYLOCAINE Place 1 application into the urethra as needed (local anesthesia).   linaclotide 72 MCG capsule Commonly known as: LINZESS Take 72 mcg by mouth daily before breakfast.   losartan 50 MG tablet Commonly known as: COZAAR Take 50 mg by mouth daily.   montelukast 10 MG tablet Commonly known as: SINGULAIR Take 10 mg by mouth at bedtime.   ondansetron 4 MG tablet Commonly known as: Zofran Take 1 tablet (4 mg total) by mouth every 8 (eight) hours as needed for nausea or vomiting.   oxybutynin 10 MG 24 hr tablet Commonly known as: DITROPAN-XL Take 10 mg by mouth at bedtime.   pentosan polysulfate 100 MG capsule Commonly known as: ELMIRON Take 100 mg by mouth 3 (three) times daily.   promethazine 12.5 MG tablet Commonly known as: PHENERGAN Take 12.5 mg by mouth every 6 (six) hours as needed for nausea or vomiting.   rosuvastatin 10 MG  tablet Commonly known as: CRESTOR Take 10 mg by mouth daily.   ustekinumab 90 MG/ML Sosy injection Commonly known as: STELARA Inject 90 mg into the skin every 3 (three) months.       Diagnostic Studies: No results found.  Disposition: Discharge disposition: 01-Home or Self Care       Discharge Instructions    Call MD / Call 911   Complete by: As directed    If you experience chest pain or shortness of breath, CALL 911 and be transported to the hospital emergency room.  If you develope a fever above 101 F, pus (white drainage) or increased drainage or redness at the wound, or calf pain, call  your surgeon's office.   Constipation Prevention   Complete by: As directed    Drink plenty of fluids.  Prune juice may be helpful.  You may use a stool softener, such as Colace (over the counter) 100 mg twice a day.  Use MiraLax (over the counter) for constipation as needed.   Diet - low sodium heart healthy   Complete by: As directed    Discharge instructions   Complete by: As directed    Follow up with Dr. Martha Clan on 09-15-2020 at 11:30 AM.  Wear sling at all times, including sleep.  You will need to use the sling for a total of 4 weeks following surgery.  Do not try and lift your arm up or away from your body for any reason.   Keep the dressing dry.  You may remove bandage in 3 days.  Leave the Steri-Strips (strips of white medical tape) in place.  You may place additional Band-Aids over top of the Steri-Strips if you wish.  The patient may shower once dressing is removed 3 days after surgery.  Remove sling carefully for showers ONLY, leaving arm down by your side while in the shower.  If the the pain medication causes itching try taking one 25mg  tablet of over-the-counter Benadryl.    You may be most comfortable sleeping in a recliner.  If you do sleep in your bed, placed pillows behind the shoulder that have the operation to support it.  Contact Dr. office with any  questions or concerns at [phone].   Driving restrictions   Complete by: As directed    No driving until follow up with Dr. [phone] on 09-15-2020 11:30 AM   Increase activity slowly as tolerated   Complete by: As directed    Lifting restrictions   Complete by: As directed    No lifting for 12-16 weeks   Post-operative opioid taper instructions:   Complete by: As directed    POST-OPERATIVE OPIOID TAPER INSTRUCTIONS: It is important to wean off of your opioid medication as soon as possible. If you do not need pain medication after your surgery it is ok to stop day one. Opioids include: Codeine, Hydrocodone(Norco, Vicodin), Oxycodone(Percocet, oxycontin) and hydromorphone amongst others.  Long term and even short term use of opiods can cause: Increased pain response Dependence Constipation Depression Respiratory depression And more.  Withdrawal symptoms can include Flu like symptoms Nausea, vomiting And more Techniques to manage these symptoms Hydrate well Eat regular healthy meals Stay active Use relaxation techniques(deep breathing, meditating, yoga) Do Not substitute Alcohol to help with tapering If you have been on opioids for less than two weeks and do not have pain than it is ok to stop all together.  Plan to wean off of opioids This plan should start within one week post op of your joint replacement. Maintain the same interval or time between taking each dose and first decrease the dose.  Cut the total daily intake of opioids by one tablet each day Next start to increase the time between doses. The last dose that should be eliminated is the evening dose.          Follow-up Information    11-15-2020, MD Follow up.   Specialty: Orthopedic Surgery Why: 5/4 11:30 Contact information: [address] [place] [phone]                Signed: [phone] ,MD 09/08/2020, 2:22 PM

## 2020-09-08 NOTE — Evaluation (Signed)
Occupational Therapy Evaluation Patient Details Name: Joanna James MRN: [id number] DOB: [birth year] Today's Date: 09/08/2020    History of Present Illness Pt is a 60 y.o. female s/p R shoulder arthroscopy with mini-open rotator cuff repair and subacromial decompression, distal clavicle excision R 09/07/20.  Pt had sustained a fall injuring R shoulder (dx with R shoulder rotator cuff tear--full-thickness rotator cuff tear involving the supraspinatus; high grade partial-thickness tear of biceps tendon, subacromial impingement and acromioclavicular joint arthrosis).  PMH includes sleep apnea, htn, renal insufficiency, fibromyalgia, chronic pain syndrome, gout, PNA, spinal stenosis, meniscus tear.   Clinical Impression   Patient was seen for an OT evaluation this date. Prior to surgery, pt was independent with ADLs/IADLs and functional mobility. Pt was living in a 1-story home with parents, who are available to provide assistance PRN. Pt has orders for R UE to be immobilized and will be NWBing per MD. Patient currently presents with impaired strength/ROM, pain, and sensation to RUE. These impairments result in a decreased ability to perform self care tasks requiring MOD assist for UB dressing, MAX assist for application of polar care and sling/immobilizer, MAX A for LB dressing, and MIN GUARD for functional mobility w/out AD. Pt instructed in polar care mgt, sling/immobilizer mgt, UE precautions, adaptive strategies for bathing/dressing, and positioning for sleep; handout provided. OT adjusted sling/immobilizer and polar care to improve comfort, optimize positioning, and to maximize skin integrity/safety. Pt verbalized understanding of all education/training provided. Pt will benefit from additional skilled OT services to address these limitations and improve independence in daily tasks. Recommend HHOT services to continue therapy to maximize return to PLOF, address home/routines modifications and  safety, minimize falls risk, and minimize caregiver burden.      Follow Up Recommendations  Home health OT;Supervision - Intermittent    Equipment Recommendations  None recommended by OT       Precautions / Restrictions Precautions Precautions: Shoulder;Fall Shoulder Interventions: Shoulder sling/immobilizer;Shoulder abduction pillow Restrictions Weight Bearing Restrictions: Yes RUE Weight Bearing: Non weight bearing Other Position/Activity Restrictions: No lifting 12-16 weeks.      Mobility Bed Mobility Overal bed mobility: Needs Assistance Bed Mobility: Supine to Sit;Sit to Supine     Supine to sit: Supervision;HOB elevated Sit to supine: Supervision;HOB elevated   General bed mobility comments: increased time to perform on own d/t R shoulder pain    Transfers Overall transfer level: Needs assistance Equipment used: None Transfers: Sit to/from Stand Sit to Stand: Min guard          Balance Overall balance assessment: Needs assistance Sitting-balance support: No upper extremity supported;Feet supported Sitting balance-Leahy Scale: Good Sitting balance - Comments: Good sitting balance while participating in UB dressing   Standing balance support: No upper extremity supported;During functional activity Standing balance-Leahy Scale: Fair Standing balance comment: one lateral LOB during standing hand hygiene, with pt able to steady herself following CGA from OT                           ADL either performed or assessed with clinical judgement   ADL Overall ADL's : Needs assistance/impaired     Grooming: Wash/dry hands;Min guard;Standing Grooming Details (indicate cue type and reason): one lateral LOB during standing hand hygiene, with pt able to steady herself following CGA from OT         Upper Body Dressing : Moderate assistance;Sitting Upper Body Dressing Details (indicate cue type and reason): MOD A to don/doff hospital gown while  sitting  EOB Lower Body Dressing: Maximal assistance;Bed level Lower Body Dressing Details (indicate cue type and reason): MAX A to don socks Toilet Transfer: Min guard;Ambulation;BSC   Toileting- Architect and Hygiene: Min guard;Sitting/lateral lean       Functional mobility during ADLs: Min guard                    Pertinent Vitals/Pain Pain Assessment: 0-10 Pain Score: 8  Pain Location: R shoulder Pain Descriptors / Indicators: Constant;Discomfort;Grimacing;Guarding;Sharp Pain Intervention(s): Limited activity within patient's tolerance;Monitored during session;Premedicated before session;Repositioned;Relaxation     Hand Dominance Right   Extremity/Trunk Assessment Upper Extremity Assessment Upper Extremity Assessment: Overall WFL for tasks assessed;RUE deficits/detail (L UE WFL) RUE Deficits / Details: R UE in immobilizer sling with abduction pillow RUE: Unable to fully assess due to immobilization   Lower Extremity Assessment Lower Extremity Assessment: Overall WFL for tasks assessed   Cervical / Trunk Assessment Cervical / Trunk Assessment: Normal   Communication Communication Communication: No difficulties   Cognition Arousal/Alertness: Awake/alert Behavior During Therapy: WFL for tasks assessed/performed Overall Cognitive Status: Within Functional Limits for tasks assessed                                        Exercises Other Exercises Other Exercises: Pt instructed in polar care mgt, sling/immobilizer mgt, UE precautions, adaptive strategies for bathing/dressing, and positioning for sleep; handout provided   Shoulder Instructions Shoulder Instructions Donning/doffing shirt without moving shoulder: Moderate assistance Donning/doffing sling/immobilizer: Maximal assistance Correct positioning of sling/immobilizer: Maximal assistance    Home Living Family/patient expects to be discharged to:: Private residence Living Arrangements:  Parent (parents staying with pt temporarily while their home is being remodeled) Available Help at Discharge: Family;Available PRN/intermittently Type of Home: House Home Access: Stairs to enter;Ramped entrance (pt reports ramp has B railings but gets slippery when wet (is how pt fell on December 19th injuring her R shoulder)) Entrance Stairs-Number of Steps: 2 then 2-3 more steps to enter Entrance Stairs-Rails: None Home Layout: One level     Bathroom Shower/Tub: Chief Strategy Officer: Standard     Home Equipment: Environmental consultant - 2 wheels          Prior Functioning/Environment Level of Independence: Independent        Comments: Pt reports being independent with ADLs. Pt reports that at baseline she drives and works        OT Problem List: Impaired balance (sitting and/or standing);Decreased activity tolerance;Pain      OT Treatment/Interventions: Self-care/ADL training;Therapeutic exercise;DME and/or AE instruction;Therapeutic activities;Patient/family education;Balance training    OT Goals(Current goals can be found in the care plan section) Acute Rehab OT Goals Patient Stated Goal: to go home; improve pain OT Goal Formulation: With patient Time For Goal Achievement: 09/22/20 Potential to Achieve Goals: Good ADL Goals Pt Will Perform Grooming: with modified independence;standing Pt Will Perform Upper Body Dressing: with min assist;sitting Pt Will Transfer to Toilet: with modified independence;ambulating;regular height toilet  OT Frequency: Min 1X/week    AM-PAC OT "6 Clicks" Daily Activity     Outcome Measure Help from another person eating meals?: None Help from another person taking care of personal grooming?: A Little Help from another person toileting, which includes using toliet, bedpan, or urinal?: A Little Help from another person bathing (including washing, rinsing, drying)?: A Lot Help from another person to put on and  taking off regular upper body  clothing?: A Lot Help from another person to put on and taking off regular lower body clothing?: A Lot 6 Click Score: 16   End of Session Equipment Utilized During Treatment: Other (comment) (shoulder sling/immobilizer) Nurse Communication: Mobility status  Activity Tolerance: Patient limited by pain Patient left: in bed;with call bell/phone within reach;with bed alarm set  OT Visit Diagnosis: Unsteadiness on feet (R26.81);Pain Pain - Right/Left: Right Pain - part of body: Shoulder                Time: 6222-9798 OT Time Calculation (min): 48 min Charges:  OT General Charges $OT Visit: 1 Visit OT Evaluation $OT Eval Moderate Complexity: 1 Mod OT Treatments $Self Care/Home Management : 23-37 mins  Matthew Folks, OTR/L [facility] [phone]

## 2020-09-08 NOTE — Progress Notes (Signed)
  Subjective:  POD #1 s/p right shoulder rotator cuff repair.   Patient reports right shoulder pain as moderate.  Patient was admitted postoperatively for significant shoulder pain.  This has improved today.  Patient was able to ambulate with physical therapy this morning.  Objective:   VITALS:   Vitals:   09/07/20 1719 09/07/20 1934 09/08/20 0440 09/08/20 0858  BP: (!) 141/85 (!) 145/80 (!) 139/92 (!) 144/89  Pulse: 86 95 81 82  Resp: 16 17 16 16   Temp:  98.4 F (36.9 C) 99 F (37.2 C) 98.8 F (37.1 C)  TempSrc:  Oral Oral   SpO2: 98% 96% 97% 94%  Weight:      Height:        PHYSICAL EXAM: Right upper extremity: Patient's dressing is clean dry and intact.  The right upper extremity is in an abduction sling. Neurovascular intact Sensation intact distally Intact pulses distally No cellulitis present Compartment soft  LABS  No results found for this or any previous visit (from the past 24 hour(s)).  No results found.  Assessment/Plan: 1 Day Post-Op   Active Problems:   S/P right rotator cuff repair  Patient's pain has improved postoperatively.  She will receive an afternoon session of physical therapy and then may be discharged home.  Her mother was given prescriptions postoperatively yesterday for Dilaudid and Zofran.  Patient will follow up with me for her routine postoperative visit as previously scheduled in 7-10 days in the office.    9-10 , MD 09/08/2020, 2:18 PM

## 2020-09-08 NOTE — Progress Notes (Signed)
Physical Therapy Treatment Patient Details Name: Joanna James MRN: [id number] DOB: 08/24/1960 Today's Date: 09/08/2020    History of Present Illness Pt is a 60 y.o. female s/p R shoulder arthroscopy with mini-open rotator cuff repair and subacromial decompression, distal clavicle excision R 09/07/20.  Pt had sustained a fall injuring R shoulder (dx with R shoulder rotator cuff tear--full-thickness rotator cuff tear involving the supraspinatus; high grade partial-thickness tear of biceps tendon, subacromial impingement and acromioclavicular joint arthrosis).  PMH includes sleep apnea, htn, renal insufficiency, fibromyalgia, chronic pain syndrome, gout, PNA, spinal stenosis, meniscus tear.    PT Comments    Pt was long sitting in bed. She is resistive to sessio at first due to pain but eventually agreeable. Was able to exit bed, stand,and ambulate with SPC without difficulty. Pt has no LOB or safety concerns throughout session. Recommend DC home with HHPT to follow. Pt has assistance at home from mother and planning to go home this afternoon.     Follow Up Recommendations  Home health PT     Equipment Recommendations  Cane       Precautions / Restrictions Precautions Precautions: Shoulder;Fall Shoulder Interventions: Shoulder sling/immobilizer;Shoulder abduction pillow Precaution Booklet Issued: No Restrictions Weight Bearing Restrictions: Yes RUE Weight Bearing: Non weight bearing Other Position/Activity Restrictions: No lifting 12-16 weeks.    Mobility  Bed Mobility Overal bed mobility: Modified Independent Bed Mobility: Supine to Sit;Sit to Supine     Supine to sit: Supervision;HOB elevated Sit to supine: Supervision;HOB elevated   General bed mobility comments: HOB slightly elevated however no physical assistance required to exit bed or reposition back in bed post session.    Transfers Overall transfer level: Needs assistance Equipment used: Straight  cane Transfers: Sit to/from Stand Sit to Stand: Supervision         General transfer comment: Pt was able to stand to [facility] without physical asistance or Vcs  Ambulation/Gait Ambulation/Gait assistance: Min guard;Supervision Gait Distance (Feet): 75 Feet Assistive device: Straight cane Gait Pattern/deviations: Step-through pattern Gait velocity: decreased   General Gait Details: Pt was able to ambulate 75 ft without difficulty or safety concern.      Balance Overall balance assessment: Needs assistance Sitting-balance support: No upper extremity supported;Feet supported Sitting balance-Leahy Scale: Good Sitting balance - Comments: Good sitting balance while participating in UB dressing   Standing balance support: No upper extremity supported;During functional activity Standing balance-Leahy Scale: Good Standing balance comment: one lateral LOB during standing hand hygiene, with pt able to steady herself following CGA from OT       Cognition Arousal/Alertness: Awake/alert Behavior During Therapy: WFL for tasks assessed/performed Overall Cognitive Status: Within Functional Limits for tasks assessed       Exercises Other Exercises Other Exercises: Pt instructed in polar care mgt, sling/immobilizer mgt, UE precautions, adaptive strategies for bathing/dressing, and positioning for sleep; handout provided Donning/doffing shirt without moving shoulder: Moderate assistance Donning/doffing sling/immobilizer: Maximal assistance Correct positioning of sling/immobilizer: Maximal assistance        Pertinent Vitals/Pain Pain Assessment: 0-10 Pain Score: 7  Pain Location: R shoulder Pain Descriptors / Indicators: Constant;Discomfort;Grimacing;Guarding;Sharp Pain Intervention(s): Limited activity within patient's tolerance;Monitored during session;Premedicated before session;Repositioned;Ice applied    Home Living Family/patient expects to be discharged to:: Private  residence Living Arrangements: Parent (parents staying with pt temporarily while their home is being remodeled) Available Help at Discharge: Family;Available PRN/intermittently Type of Home: House Home Access: Stairs to enter;Ramped entrance (pt reports ramp has B railings but gets slippery when  wet (is how pt fell on December 19th injuring her R shoulder)) Entrance Stairs-Rails: None Home Layout: One level Home Equipment: Walker - 2 wheels      Prior Function Level of Independence: Independent      Comments: Pt reports being independent with ADLs. Pt reports that at baseline she drives and works   PT Goals (current goals can now be found in the care plan section) Acute Rehab PT Goals Patient Stated Goal: go home when ready Progress towards PT goals: Progressing toward goals    Frequency    BID      PT Plan Current plan remains appropriate       AM-PAC PT "6 Clicks" Mobility   Outcome Measure  Help needed turning from your back to your side while in a flat bed without using bedrails?: A Little Help needed moving from lying on your back to sitting on the side of a flat bed without using bedrails?: A Little Help needed moving to and from a bed to a chair (including a wheelchair)?: A Little Help needed standing up from a chair using your arms (e.g., wheelchair or bedside chair)?: A Little Help needed to walk in hospital room?: A Little Help needed climbing 3-5 steps with a railing? : A Little 6 Click Score: 18    End of Session Equipment Utilized During Treatment: Gait belt;Other (comment) (Sling/shoulder immobilized) Activity Tolerance: Patient tolerated treatment well;Patient limited by pain Patient left: in bed;with call bell/phone within reach;with bed alarm set;with nursing/sitter in room Nurse Communication: Mobility status PT Visit Diagnosis: Unsteadiness on feet (R26.81);Other abnormalities of gait and mobility (R26.89);Muscle weakness (generalized)  (M62.81);History of falling (Z91.81);Difficulty in walking, not elsewhere classified (R26.2);Pain Pain - Right/Left: Right Pain - part of body: Shoulder     Time: 1419-1440 PT Time Calculation (min) (ACUTE ONLY): 21 min  Charges:  $Gait Training: 8-22 mins                     Jetta Lout PTA 09/08/20, 4:19 PM

## 2020-09-08 NOTE — TOC Progression Note (Signed)
Transition of Care [facility]) - Progression Note    Patient Details  Name: Joanna James MRN: [id number] Date of Birth: 04-14-1961  Transition of Care [facility] CM/SW Contact  Barrie Dunker, RN Phone Number: 09/08/2020, 2:30 PM  Clinical Narrative:   Spoke with the patient at the bedside, she sis in need of HH PT, OT, Cory with [facility] has accepted the patient but can't start until Monday Notified the patient, She lives at home with her parents and has transportation  She can afford her medication, she needs a cane and wants it to be brought in to the room by [employer] prior to DC, [place] Nick[facility] with [employer], no additional needs at this time        Expected Discharge Plan and Services           Expected Discharge Date: 09/08/20                                     Social Determinants of Health (SDOH) Interventions    Readmission Risk Interventions No flowsheet data found.

## 2020-09-08 NOTE — Progress Notes (Signed)
Discharge instructions reviewed with the pt and her mother. Paper prescription was given to the family yesterday by the MD, per the pt and mother , they have the prescription. AVS was discussed including wound care, activity, f/u appointments, s/s of infection and medications. Iv dc'd tip intact. All questions answered.

## 2020-11-09 ENCOUNTER — Other Ambulatory Visit (HOSPITAL_COMMUNITY): Payer: Self-pay | Admitting: Orthopedic Surgery

## 2020-11-09 ENCOUNTER — Other Ambulatory Visit: Payer: Self-pay | Admitting: Orthopedic Surgery

## 2020-11-09 DIAGNOSIS — M25521 Pain in right elbow: Secondary | ICD-10-CM

## 2020-12-27 ENCOUNTER — Other Ambulatory Visit: Payer: Self-pay | Admitting: Orthopedic Surgery

## 2020-12-27 ENCOUNTER — Other Ambulatory Visit (HOSPITAL_COMMUNITY): Payer: Self-pay | Admitting: Orthopedic Surgery

## 2020-12-27 DIAGNOSIS — M25521 Pain in right elbow: Secondary | ICD-10-CM

## 2021-03-01 ENCOUNTER — Ambulatory Visit
Admission: RE | Admit: 2021-03-01 | Discharge: 2021-03-01 | Disposition: A | Discharge status: Home / Self Care | Payer: Medicare Other | Source: Ambulatory Visit | Attending: Orthopedic Surgery | Admitting: Orthopedic Surgery

## 2021-03-01 ENCOUNTER — Other Ambulatory Visit: Payer: Self-pay

## 2021-03-01 DIAGNOSIS — M25521 Pain in right elbow: Secondary | ICD-10-CM | POA: Diagnosis present

## 2021-03-28 ENCOUNTER — Other Ambulatory Visit: Payer: Self-pay | Admitting: Orthopedic Surgery

## 2021-03-28 DIAGNOSIS — M5412 Radiculopathy, cervical region: Secondary | ICD-10-CM

## 2021-04-13 ENCOUNTER — Other Ambulatory Visit: Payer: Self-pay

## 2021-04-13 ENCOUNTER — Ambulatory Visit
Admission: RE | Admit: 2021-04-13 | Discharge: 2021-04-13 | Disposition: A | Discharge status: Home / Self Care | Payer: Medicare Other | Source: Ambulatory Visit | Attending: Orthopedic Surgery | Admitting: Orthopedic Surgery

## 2021-04-13 DIAGNOSIS — M5412 Radiculopathy, cervical region: Secondary | ICD-10-CM | POA: Diagnosis present

## 2021-06-13 ENCOUNTER — Other Ambulatory Visit: Payer: Self-pay

## 2021-06-13 DIAGNOSIS — R1011 Right upper quadrant pain: Secondary | ICD-10-CM | POA: Diagnosis not present

## 2021-06-13 DIAGNOSIS — R112 Nausea with vomiting, unspecified: Secondary | ICD-10-CM | POA: Insufficient documentation

## 2021-06-13 DIAGNOSIS — R079 Chest pain, unspecified: Secondary | ICD-10-CM | POA: Insufficient documentation

## 2021-06-13 DIAGNOSIS — I10 Essential (primary) hypertension: Secondary | ICD-10-CM | POA: Diagnosis not present

## 2021-06-13 DIAGNOSIS — Z79899 Other long term (current) drug therapy: Secondary | ICD-10-CM | POA: Diagnosis not present

## 2021-06-13 DIAGNOSIS — Z7982 Long term (current) use of aspirin: Secondary | ICD-10-CM | POA: Insufficient documentation

## 2021-06-13 DIAGNOSIS — R1010 Upper abdominal pain, unspecified: Secondary | ICD-10-CM | POA: Diagnosis present

## 2021-06-13 NOTE — ED Triage Notes (Signed)
Pt presents to ER c/o ruq abd pain x1 week. Pt states pain is worse after eating.  Pt denies prior cholecystectomy.  Pt states she has had n/v but denies diarrhea.  Pt states last BM was today and it was very small.  Pt A&O x4 at this time and appears uncomfortable.

## 2021-06-14 ENCOUNTER — Encounter: Payer: Self-pay | Admitting: Radiology

## 2021-06-14 ENCOUNTER — Telehealth: Payer: Self-pay

## 2021-06-14 ENCOUNTER — Emergency Department: Payer: Medicare Other

## 2021-06-14 ENCOUNTER — Emergency Department
Admission: EM | Admit: 2021-06-14 | Discharge: 2021-06-14 | Disposition: A | Discharge status: Home / Self Care | Payer: Medicare Other | Attending: Emergency Medicine | Admitting: Emergency Medicine

## 2021-06-14 DIAGNOSIS — R1011 Right upper quadrant pain: Secondary | ICD-10-CM | POA: Diagnosis not present

## 2021-06-14 LAB — CBC
HCT: 41.6 % (ref 36.0–46.0)
Hemoglobin: 12.9 g/dL (ref 12.0–15.0)
MCH: 28.6 pg (ref 26.0–34.0)
MCHC: 31 g/dL (ref 30.0–36.0)
MCV: 92.2 fL (ref 80.0–100.0)
Platelets: 323 10*3/uL (ref 150–400)
RBC: 4.51 MIL/uL (ref 3.87–5.11)
RDW: 13.2 % (ref 11.5–15.5)
WBC: 10.1 10*3/uL (ref 4.0–10.5)
nRBC: 0 % (ref 0.0–0.2)

## 2021-06-14 LAB — URINALYSIS, ROUTINE W REFLEX MICROSCOPIC
Bilirubin Urine: NEGATIVE
Glucose, UA: NEGATIVE mg/dL
Hgb urine dipstick: NEGATIVE
Leukocytes,Ua: NEGATIVE
Nitrite: NEGATIVE
Protein, ur: NEGATIVE mg/dL
Specific Gravity, Urine: 1.02 (ref 1.005–1.030)
pH: 6.5 (ref 5.0–8.0)

## 2021-06-14 LAB — COMPREHENSIVE METABOLIC PANEL
ALT: 13 U/L (ref 0–44)
AST: 14 U/L — ABNORMAL LOW (ref 15–41)
Albumin: 3.9 g/dL (ref 3.5–5.0)
Alkaline Phosphatase: 81 U/L (ref 38–126)
Anion gap: 7 (ref 5–15)
BUN: 15 mg/dL (ref 8–23)
CO2: 30 mmol/L (ref 22–32)
Calcium: 9.1 mg/dL (ref 8.9–10.3)
Chloride: 103 mmol/L (ref 98–111)
Creatinine, Ser: 1.2 mg/dL — ABNORMAL HIGH (ref 0.44–1.00)
GFR, Estimated: 52 mL/min — ABNORMAL LOW (ref >60)
Glucose, Bld: 114 mg/dL — ABNORMAL HIGH (ref 70–99)
Potassium: 3.6 mmol/L (ref 3.5–5.1)
Sodium: 140 mmol/L (ref 135–145)
Total Bilirubin: 0.3 mg/dL (ref 0.3–1.2)
Total Protein: 7.4 g/dL (ref 6.5–8.1)

## 2021-06-14 LAB — TROPONIN I (HIGH SENSITIVITY): Troponin I (High Sensitivity): 3 ng/L (ref <18)

## 2021-06-14 LAB — LIPASE, BLOOD: Lipase: 29 U/L (ref 11–51)

## 2021-06-14 MED ORDER — SODIUM CHLORIDE 0.9 % IV BOLUS (SEPSIS)
1000.0000 mL | Freq: Once | INTRAVENOUS | Status: AC
Start: 2021-06-14 — End: 2021-06-14
  Administered 2021-06-14: 1000 mL via INTRAVENOUS

## 2021-06-14 MED ORDER — TRAMADOL HCL 50 MG PO TABS
50.0000 mg | ORAL_TABLET | Freq: Four times a day (QID) | ORAL | 0 refills | Status: AC | PRN
Start: 2021-06-14 — End: 2022-06-14

## 2021-06-14 MED ORDER — HYDROMORPHONE HCL 1 MG/ML IJ SOLN
1.0000 mg | Freq: Once | INTRAMUSCULAR | Status: AC
  Administered 2021-06-14: 1 mg via INTRAVENOUS
  Filled 2021-06-14: qty 1

## 2021-06-14 MED ORDER — LIDOCAINE VISCOUS HCL 2 % MT SOLN
15.0000 mL | Freq: Once | OROMUCOSAL | Status: AC
  Administered 2021-06-14: 15 mL via ORAL
  Filled 2021-06-14: qty 15

## 2021-06-14 MED ORDER — ALUM & MAG HYDROXIDE-SIMETH 200-200-20 MG/5ML PO SUSP
30.0000 mL | Freq: Once | ORAL | Status: AC
  Administered 2021-06-14: 30 mL via ORAL
  Filled 2021-06-14: qty 30

## 2021-06-14 MED ORDER — PANTOPRAZOLE SODIUM 40 MG PO TBEC: 40.0000 mg | DELAYED_RELEASE_TABLET | Freq: Every day | ORAL | 1 refills | Status: AC

## 2021-06-14 MED ORDER — IOHEXOL 350 MG/ML SOLN
100.0000 mL | Freq: Once | INTRAVENOUS | Status: AC | PRN
  Administered 2021-06-14: 100 mL via INTRAVENOUS

## 2021-06-14 MED ORDER — PANTOPRAZOLE SODIUM 40 MG PO TBEC
40.0000 mg | DELAYED_RELEASE_TABLET | Freq: Once | ORAL | Status: AC
  Administered 2021-06-14: 40 mg via ORAL
  Filled 2021-06-14: qty 1

## 2021-06-14 MED ORDER — DICYCLOMINE HCL 10 MG PO CAPS
20.0000 mg | ORAL_CAPSULE | Freq: Once | ORAL | Status: AC
  Administered 2021-06-14: 20 mg via ORAL
  Filled 2021-06-14: qty 2

## 2021-06-14 MED ORDER — ONDANSETRON HCL 4 MG/2ML IJ SOLN
4.0000 mg | Freq: Once | INTRAMUSCULAR | Status: AC
  Administered 2021-06-14: 4 mg via INTRAVENOUS
  Filled 2021-06-14: qty 2

## 2021-06-14 MED ORDER — ONDANSETRON 4 MG PO TBDP: 4.0000 mg | ORAL_TABLET | Freq: Four times a day (QID) | ORAL | 0 refills | Status: DC | PRN

## 2021-06-14 NOTE — ED Notes (Signed)
Pt taken to CT at this time.

## 2021-06-14 NOTE — ED Provider Notes (Signed)
[facility] Provider Note    Event Date/Time   First MD Initiated Contact with Patient 06/14/21 [phone]     (approximate)   History   Abdominal Pain   HPI  Joanna James is a 61 y.o. female with history of hypertension, obesity, fibromyalgia, interstitial cystitis who presents to the emergency department with upper abdominal pain that is sharp, severe in nature and intermittent over the past week.  Worse after eating and associated with nausea and vomiting.  She did have some chest pain but this has resolved.  No shortness of breath.  No fevers, cough, diarrhea, bloody stools, melena, vaginal bleeding, discharge, hematuria.  Has chronic dysuria from interstitial cystitis which she states is unchanged.  She has had previous appendectomy and total hysterectomy.  Also has had surgery for an ectopic pregnancy, BTL and multiple laparoscopic surgeries for endometriosis.  History provided by patient and family.    Past Medical History:  Diagnosis Date   Chronic pain syndrome    Complication of anesthesia    placing the ET was difficult per patient   Fibromyalgia    GERD (gastroesophageal reflux disease)    Gout    Hypertension    IC (interstitial cystitis)    Pneumonia    as a child   Sleep apnea    is not using her cpap   Spinal stenosis     Past Surgical History:  Procedure Laterality Date   ABDOMINAL HYSTERECTOMY     APPENDECTOMY     BLADDER SUSPENSION     CYSTOSCOPY WITH STENT PLACEMENT Left 08/13/2016   Procedure: CYSTOSCOPY WITH STENT PLACEMENT;  Surgeon: Irine Seal, MD;  Location: [facility] ORS;  Service: Urology;  Laterality: Left;   ECTOPIC PREGNANCY SURGERY     meniscus tear     ROTATOR CUFF REPAIR     SHOULDER ARTHROSCOPY WITH ROTATOR CUFF REPAIR AND SUBACROMIAL DECOMPRESSION Right 09/07/2020   Procedure: RIGHT SHOULDER ARTHROSCOPY WITH MINI-OPEN ROTATOR CUFF REPAIR AND SUBACROMIAL DECOMPRESSION, DISTAL CLAVICLE EXCISION;  Surgeon:  Thornton Park, MD;  Location: [facility] ORS;  Service: Orthopedics;  Laterality: Right;   THYROIDECTOMY     TUBAL LIGATION      MEDICATIONS:  Prior to Admission medications   Medication Sig Start Date End Date Taking? Authorizing Provider  amLODipine (NORVASC) 5 MG tablet Take 5 mg by mouth daily.    [provider]  aspirin EC 81 MG tablet Take 81 mg by mouth daily. 02/23/14   [provider]  baclofen (LIORESAL) 10 MG tablet Take 10 mg by mouth daily.    [provider]  bupivacaine (MARCAINE) 0.5 % SOLN injection 15 mLs See admin instructions. Mix with Heparin and sodium bicarbonate (5 mEq) and inject into bladder catheter 09/19/18   [provider]  celecoxib (CELEBREX) 100 MG capsule Take 100 mg by mouth 2 (two) times daily.    [provider]  clobetasol ointment (TEMOVATE) [id number] % Apply 1 application topically 2 (two) times daily as needed (irritation). 01/29/17   [provider]  famotidine (PEPCID) 20 MG tablet Take 20 mg by mouth 2 (two) times daily.    [provider]  gabapentin (NEURONTIN) 300 MG capsule Take 600 mg by mouth 3 (three) times daily.    [provider]  Galcanezumab-gnlm (EMGALITY) 120 MG/ML SOSY Inject 120 mg into the skin every 28 (twenty-eight) days.    [provider]  HYDROmorphone (DILAUDID) 2 MG tablet Take 1-2 tablets (2-4 mg total) by mouth  every 4 (four) hours as needed for severe pain. 09/07/20   Thornton Park, MD  hydrOXYzine (ATARAX/VISTARIL) 25 MG tablet Take 25 mg by mouth 2 (two) times daily.    [provider]  hyoscyamine (ANASPAZ) 0.125 MG TBDP disintergrating tablet Place 1 tablet (0.125 mg total) under the tongue every 6 (six) hours as needed for cramping. 03/05/17   Nicholes Mango, MD  levothyroxine (SYNTHROID) 175 MCG tablet Take 175 mcg by mouth daily before breakfast.    [provider]  lidocaine (XYLOCAINE) 2 % jelly Place 1 application into the  urethra as needed (local anesthesia).    [provider]  linaclotide (LINZESS) 72 MCG capsule Take 72 mcg by mouth daily before breakfast.    [provider]  losartan (COZAAR) 50 MG tablet Take 50 mg by mouth daily.    [provider]  montelukast (SINGULAIR) 10 MG tablet Take 10 mg by mouth at bedtime.    [provider]  ondansetron (ZOFRAN) 4 MG tablet Take 1 tablet (4 mg total) by mouth every 8 (eight) hours as needed for nausea or vomiting. 09/07/20   Thornton Park, MD  oxybutynin (DITROPAN-XL) 10 MG 24 hr tablet Take 10 mg by mouth at bedtime.    [provider]  pentosan polysulfate (ELMIRON) 100 MG capsule Take 100 mg by mouth 3 (three) times daily.    [provider]  promethazine (PHENERGAN) 12.5 MG tablet Take 12.5 mg by mouth every 6 (six) hours as needed for nausea or vomiting.    [provider]  rosuvastatin (CRESTOR) 10 MG tablet Take 10 mg by mouth daily.    [provider]  ustekinumab (STELARA) 90 MG/ML SOSY injection Inject 90 mg into the skin every 3 (three) months.    [provider]    Physical Exam   Triage Vital Signs: ED Triage Vitals  Enc Vitals Group     BP 06/13/21 2345 (!) 163/102     Pulse Rate 06/13/21 2345 68     Resp 06/13/21 2345 16     Temp 06/13/21 2345 98.7 F (37.1 C)     Temp Source 06/13/21 2345 Oral     SpO2 06/13/21 2345 100 %     Weight 06/13/21 2347 240 lb (108.9 kg)     Height 06/13/21 2347 5\' 3"  (1.6 m)     Head Circumference --      Peak Flow --      Pain Score 06/13/21 2347 10     Pain Loc --      Pain Edu? --      Excl. in [place]? --     Most recent vital signs: Vitals:   06/14/21 0339 06/14/21 0524  BP: (!) 159/80 (!) 159/81  Pulse: 64 67  Resp: 18 18  Temp:    SpO2: 99% 95%    CONSTITUTIONAL: Alert and oriented and responds appropriately to questions. Well-appearing; well-nourished HEAD: Normocephalic, atraumatic EYES: Conjunctivae clear,  pupils appear equal, sclera nonicteric ENT: normal nose; moist mucous membranes NECK: Supple, normal ROM CARD: RRR; S1 and S2 appreciated; no murmurs, no clicks, no rubs, no gallops RESP: Normal chest excursion without splinting or tachypnea; breath sounds clear and equal bilaterally; no wheezes, no rhonchi, no rales, no hypoxia or respiratory distress, speaking full sentences ABD/GI: Normal bowel sounds; non-distended; soft, tender to palpation throughout the upper abdomen without guarding or rebound BACK: The back appears normal EXT: Normal ROM in all joints; no deformity noted, no edema; no  cyanosis SKIN: Normal color for age and race; warm; no rash on exposed skin NEURO: Moves all extremities equally, normal speech PSYCH: The patient's mood and manner are appropriate.   ED Results / Procedures / Treatments   LABS: (all labs ordered are listed, but only abnormal results are displayed) Labs Reviewed  COMPREHENSIVE METABOLIC PANEL - Abnormal; Notable for the following components:      Result Value   Glucose, Bld 114 (*)    Creatinine, Ser 1.20 (*)    AST 14 (*)    GFR, Estimated 52 (*)    All other components within normal limits  URINALYSIS, ROUTINE W REFLEX MICROSCOPIC - Abnormal; Notable for the following components:   Ketones, ur TRACE (*)    All other components within normal limits  LIPASE, BLOOD  CBC  TROPONIN I (HIGH SENSITIVITY)     EKG:  EKG Interpretation  Date/Time:  Monday June 13 2021 23:49:28 EST Ventricular Rate:  67 PR Interval:  174 QRS Duration: 88 QT Interval:  386 QTC Calculation: 407 R Axis:   -47 Text Interpretation: Normal sinus rhythm Left anterior fascicular block Cannot rule out Inferior infarct (masked by fascicular block?) , age undetermined Possible Anterolateral infarct (cited on or before 03-Sep-2020) Abnormal ECG When compared with ECG of 03-Sep-2020 09:31, Questionable change in initial forces of Anteroseptal leads QT has shortened  Confirmed by Pryor Curia [phone]) on 06/14/2021 3:41:15 AM         RADIOLOGY: My personal review and interpretation of imaging: Right upper quadrant ultrasound shows no acute abnormality.  CT of the abdomen pelvis shows a small hiatal hernia but no other abnormality.  I have personally reviewed all radiology reports.   CT ABDOMEN PELVIS W CONTRAST  Result Date: 06/14/2021 CLINICAL DATA:  Right upper quadrant abdominal pain for 1 week. Pain is worse after eating. Nausea vomiting. EXAM: CT ABDOMEN AND PELVIS WITH CONTRAST TECHNIQUE: Multidetector CT imaging of the abdomen and pelvis was performed using the standard protocol following bolus administration of intravenous contrast. RADIATION DOSE REDUCTION: This exam was performed according to the departmental dose-optimization program which includes automated exposure control, adjustment of the mA and/or kV according to patient size and/or use of iterative reconstruction technique. CONTRAST:  198mL OMNIPAQUE IOHEXOL 350 MG/ML SOLN COMPARISON:  02/27/2017 FINDINGS: Lower chest: Unremarkable. Hepatobiliary: No suspicious focal abnormality within the liver parenchyma. There is no evidence for gallstones, gallbladder wall thickening, or pericholecystic fluid. No intrahepatic or extrahepatic biliary dilation. Pancreas: No focal mass lesion. No dilatation of the main duct. No intraparenchymal cyst. No peripancreatic edema. Spleen: No splenomegaly. No focal mass lesion. Adrenals/Urinary Tract: No adrenal nodule or mass. Kidneys unremarkable. No evidence for hydroureter. The urinary bladder appears normal for the degree of distention. Stomach/Bowel: Small hiatal hernia. Stomach otherwise unremarkable. Duodenum is normally positioned as is the ligament of Treitz. No small bowel wall thickening. No small bowel dilatation. The terminal ileum is normal. The appendix is not well visualized, but there is no edema or inflammation in the region of the cecum. No gross  colonic mass. No colonic wall thickening. Diverticular changes are noted in the left colon without evidence of diverticulitis. Vascular/Lymphatic: No abdominal aortic aneurysm. There is no gastrohepatic or hepatoduodenal ligament lymphadenopathy. No retroperitoneal or mesenteric lymphadenopathy. No pelvic sidewall lymphadenopathy. Reproductive: Uterus surgically absent.  There is no adnexal mass. Other: No intraperitoneal free fluid. Subtle haziness in the central small bowel mesentery is stable in the interval, nonspecific. Musculoskeletal: No worrisome lytic or sclerotic osseous abnormality.  Sacral stimulator device again noted. Degenerative disc disease noted lower lumbar spine. IMPRESSION: 1. No acute findings in the abdomen or pelvis. Specifically, no findings to explain the patient's history of right upper quadrant pain. 2. Small hiatal hernia. 3. Left colonic diverticulosis without diverticulitis. Electronically Signed   By: Misty Stanley M.D.   On: 06/14/2021 05:21   US Abdomen Limited RUQ (LIVER/GB)  Result Date: 06/14/2021 CLINICAL DATA:  Right upper quadrant pain x1 week EXAM: ULTRASOUND ABDOMEN LIMITED RIGHT UPPER QUADRANT COMPARISON:  CT abdomen/pelvis dated 02/27/2017 FINDINGS: Gallbladder: Contracted gallbladder, likely related to nonfasting state. No gallstones or gallbladder wall thickening. Negative Murphy sign. Common bile duct: Diameter: 3 mm Liver: No focal lesion identified. Within normal limits in parenchymal echogenicity. Portal vein is patent on color Doppler imaging with normal direction of blood flow towards the liver. Other: None. IMPRESSION: Negative right upper quadrant ultrasound. Electronically Signed   By: Julian Hy M.D.   On: 06/14/2021 00:58     PROCEDURES:  Critical Care performed: No      .1-3 Lead EKG Interpretation Performed by: Tonianne Fine, Delice Bison, DO Authorized by: Mingo Siegert, Delice Bison, DO     Interpretation: normal     ECG rate:  67   ECG rate assessment:  normal     Rhythm: sinus rhythm     Ectopy: none     Conduction: normal      IMPRESSION / MDM / ASSESSMENT AND PLAN / ED COURSE  I reviewed the triage vital signs and the nursing notes.    Patient here with upper abdominal pain, nausea and vomiting worse after eating for the past week.  The patient is on the cardiac monitor to evaluate for evidence of arrhythmia and/or significant heart rate changes.   DIFFERENTIAL DIAGNOSIS (includes but not limited to): Cholelithiasis, cholangitis, choledocholithiasis, cholecystitis, pancreatitis, gastritis, GERD, peptic ulcer, colitis, bowel obstruction, UTI, kidney stone, pyelonephritis.  Less likely ACS, PE, dissection.   PLAN: We will obtain CBC, CMP, lipase, urinalysis, troponin, EKG, and upper quadrant ultrasound.  Will give pain and nausea medicine.   MEDICATIONS GIVEN IN ED: Medications  sodium chloride 0.9 % bolus 1,000 mL (0 mLs Intravenous Stopped 06/14/21 0608)  HYDROmorphone (DILAUDID) injection 1 mg (1 mg Intravenous Given 06/14/21 0449)  ondansetron (ZOFRAN) injection 4 mg (4 mg Intravenous Given 06/14/21 0449)  iohexol (OMNIPAQUE) 350 MG/ML injection 100 mL (100 mLs Intravenous Contrast Given 06/14/21 0455)  dicyclomine (BENTYL) capsule 20 mg (20 mg Oral Given 06/14/21 0534)  alum & mag hydroxide-simeth (MAALOX/MYLANTA) 200-200-20 MG/5ML suspension 30 mL (30 mLs Oral Given 06/14/21 0534)    And  lidocaine (XYLOCAINE) 2 % viscous mouth solution 15 mL (15 mLs Oral Given 06/14/21 0534)  pantoprazole (PROTONIX) EC tablet 40 mg (40 mg Oral Given 06/14/21 0534)  HYDROmorphone (DILAUDID) injection 1 mg (1 mg Intravenous Given 06/14/21 0612)     ED COURSE: Right upper quadrant ultrasound reviewed by myself and radiologist shows no acute abnormality.  We will proceed with CT of the abdomen pelvis.  Labs reassuring.  Normal white blood cell count, hemoglobin.  LFTs and lipase normal.  Troponin negative.  EKG shows no ischemic abnormality.  Urine  shows no sign of infection.   CT of the abdomen pelvis reviewed by myself and radiologist and shows no acute abnormality.  She has a small hiatal hernia.  No signs of gastritis, perforation.  No bowel obstruction.  She reports her pain improved but is coming back after dose of Dilaudid.  Will  give Bentyl, GI cocktail, Protonix as this may be gastritis.    Patient reports some relief after above medications but still having pain.  Will give second dose of Dilaudid but discussed with patient that she has had a reassuring work-up today with no sign of any life-threatening illness present.  I feel she can be discharged home with close gastroenterology follow-up.  I have placed a referral for gastroenterology.  Will discharge with prescriptions of tramadol, Zofran, Protonix and recommended a bland diet.  Patient is comfortable with this plan.  She has been able to tolerate p.o. here.  At this time, I do not feel there is any life-threatening condition present. I reviewed all nursing notes, vitals, pertinent previous records.  All lab and urine results, EKGs, imaging ordered have been independently reviewed and interpreted by myself.  I reviewed all available radiology reports from any imaging ordered this visit.  Based on my assessment, I feel the patient is safe to be discharged home without further emergent workup and can continue workup as an outpatient as needed. Discussed all findings, treatment plan as well as usual and customary return precautions with patient and family member at bedside.  They verbalize understanding and are comfortable with this plan.  Outpatient follow-up has been provided as needed.  All questions have been answered.    CONSULTS: No admission needed at this time given reassuring work-up, patient tolerating p.o.   OUTSIDE RECORDS REVIEWED: Reviewed recent office visit with bariatrics on 04/28/2021 and urology on 04/18/2021.         FINAL CLINICAL IMPRESSION(S) / ED  DIAGNOSES   Final diagnoses:  RUQ abdominal pain     Rx / DC Orders   ED Discharge Orders          Ordered    Ambulatory referral to Gastroenterology       Comments: Severe upper abdominal pain with vomiting for the past week with negative work-up in the emergency department including negative right upper quadrant ultrasound and CT of the abdomen pelvis.   06/14/21 0602    traMADol (ULTRAM) 50 MG tablet  Every 6 hours PRN        06/14/21 0603    pantoprazole (PROTONIX) 40 MG tablet  Daily        06/14/21 0603    ondansetron (ZOFRAN-ODT) 4 MG disintegrating tablet  Every 6 hours PRN        06/14/21 0603             Note:  This document was prepared using Dragon voice recognition software and may include unintentional dictation errors.   [place]ville Hibberd, Delice Bison, DO 06/14/21 0630

## 2021-06-14 NOTE — ED Notes (Signed)
IV team at bedside at this time. 

## 2021-06-14 NOTE — Telephone Encounter (Signed)
CALLED PATIENT NO ANSWER LEFT VOICEMAIL FOR A CALL BACK °To schedule a in person visit °

## 2021-06-15 ENCOUNTER — Telehealth: Payer: Self-pay

## 2021-06-15 NOTE — Telephone Encounter (Signed)
CALLED PATIENT NO ANSWER LEFT VOICEMAIL FOR A CALL BACK °Letter sent °

## 2021-08-23 ENCOUNTER — Encounter: Payer: Self-pay | Admitting: Gastroenterology

## 2021-08-23 ENCOUNTER — Ambulatory Visit: Payer: Medicare Other | Admitting: Gastroenterology

## 2021-08-23 ENCOUNTER — Other Ambulatory Visit: Payer: Self-pay

## 2021-08-23 DIAGNOSIS — N8003 Adenomyosis of the uterus: Secondary | ICD-10-CM | POA: Insufficient documentation

## 2022-08-29 ENCOUNTER — Other Ambulatory Visit: Payer: Self-pay

## 2022-08-29 MED ORDER — BUPIVACAINE HCL 0.5 % IJ SOLN
15.0000 mL | Freq: Every day | INTRAMUSCULAR | 11 refills | Status: AC | PRN
  Filled 2022-08-29: qty 450, 30d supply, fill #0

## 2022-09-08 ENCOUNTER — Other Ambulatory Visit: Payer: Self-pay

## 2023-03-30 ENCOUNTER — Other Ambulatory Visit: Payer: Self-pay | Admitting: Nurse Practitioner

## 2023-03-30 DIAGNOSIS — M5412 Radiculopathy, cervical region: Secondary | ICD-10-CM

## 2023-04-06 ENCOUNTER — Ambulatory Visit
Admission: RE | Admit: 2023-04-06 | Discharge: 2023-04-06 | Disposition: A | Discharge status: Home / Self Care | Payer: 59 | Source: Ambulatory Visit | Attending: Nurse Practitioner | Admitting: Nurse Practitioner

## 2023-04-06 DIAGNOSIS — M5412 Radiculopathy, cervical region: Secondary | ICD-10-CM | POA: Insufficient documentation

## 2023-04-20 ENCOUNTER — Emergency Department: Payer: 59

## 2023-04-20 ENCOUNTER — Other Ambulatory Visit: Payer: Self-pay

## 2023-04-20 ENCOUNTER — Observation Stay: Payer: 59 | Admitting: General Practice

## 2023-04-20 ENCOUNTER — Encounter
Admission: EM | Disposition: A | Discharge status: Home / Self Care | Payer: Self-pay | Source: Home / Self Care | Attending: Student in an Organized Health Care Education/Training Program

## 2023-04-20 ENCOUNTER — Other Ambulatory Visit: Payer: Self-pay | Admitting: Surgery

## 2023-04-20 ENCOUNTER — Encounter: Payer: Self-pay | Admitting: Emergency Medicine

## 2023-04-20 ENCOUNTER — Observation Stay
Admission: EM | Admit: 2023-04-20 | Discharge: 2023-04-21 | Disposition: A | Discharge status: Home / Self Care | Payer: 59 | Attending: Surgery | Admitting: Surgery

## 2023-04-20 DIAGNOSIS — K8012 Calculus of gallbladder with acute and chronic cholecystitis without obstruction: Secondary | ICD-10-CM | POA: Diagnosis not present

## 2023-04-20 DIAGNOSIS — Z79899 Other long term (current) drug therapy: Secondary | ICD-10-CM | POA: Insufficient documentation

## 2023-04-20 DIAGNOSIS — Z7982 Long term (current) use of aspirin: Secondary | ICD-10-CM | POA: Insufficient documentation

## 2023-04-20 DIAGNOSIS — K5730 Diverticulosis of large intestine without perforation or abscess without bleeding: Secondary | ICD-10-CM | POA: Diagnosis not present

## 2023-04-20 DIAGNOSIS — I10 Essential (primary) hypertension: Secondary | ICD-10-CM | POA: Diagnosis not present

## 2023-04-20 DIAGNOSIS — K810 Acute cholecystitis: Secondary | ICD-10-CM | POA: Diagnosis not present

## 2023-04-20 DIAGNOSIS — R1011 Right upper quadrant pain: Principal | ICD-10-CM

## 2023-04-20 LAB — CBC WITH DIFFERENTIAL/PLATELET
Abs Immature Granulocytes: 0.04 10*3/uL (ref 0.00–0.07)
Basophils Absolute: 0.1 10*3/uL (ref 0.0–0.1)
Basophils Relative: 1 %
Eosinophils Absolute: 0.1 10*3/uL (ref 0.0–0.5)
Eosinophils Relative: 1 %
HCT: 42.4 % (ref 36.0–46.0)
Hemoglobin: 13.4 g/dL (ref 12.0–15.0)
Immature Granulocytes: 0 %
Lymphocytes Relative: 28 %
Lymphs Abs: 3.1 10*3/uL (ref 0.7–4.0)
MCH: 28.5 pg (ref 26.0–34.0)
MCHC: 31.6 g/dL (ref 30.0–36.0)
MCV: 90.2 fL (ref 80.0–100.0)
Monocytes Absolute: 0.7 10*3/uL (ref 0.1–1.0)
Monocytes Relative: 6 %
Neutro Abs: 7.1 10*3/uL (ref 1.7–7.7)
Neutrophils Relative %: 64 %
Platelets: 328 10*3/uL (ref 150–400)
RBC: 4.7 MIL/uL (ref 3.87–5.11)
RDW: 13.3 % (ref 11.5–15.5)
WBC: 11.1 10*3/uL — ABNORMAL HIGH (ref 4.0–10.5)
nRBC: 0 % (ref 0.0–0.2)

## 2023-04-20 LAB — URINALYSIS, ROUTINE W REFLEX MICROSCOPIC
Bilirubin Urine: NEGATIVE
Glucose, UA: NEGATIVE mg/dL
Ketones, ur: NEGATIVE mg/dL
Leukocytes,Ua: NEGATIVE
Nitrite: NEGATIVE
Protein, ur: NEGATIVE mg/dL
Specific Gravity, Urine: 1.031 — ABNORMAL HIGH (ref 1.005–1.030)
pH: 5 (ref 5.0–8.0)

## 2023-04-20 LAB — COMPREHENSIVE METABOLIC PANEL
ALT: 12 U/L (ref 0–44)
AST: 15 U/L (ref 15–41)
Albumin: 4.1 g/dL (ref 3.5–5.0)
Alkaline Phosphatase: 67 U/L (ref 38–126)
Anion gap: 11 (ref 5–15)
BUN: 21 mg/dL (ref 8–23)
CO2: 23 mmol/L (ref 22–32)
Calcium: 9.3 mg/dL (ref 8.9–10.3)
Chloride: 107 mmol/L (ref 98–111)
Creatinine, Ser: 1.47 mg/dL — ABNORMAL HIGH (ref 0.44–1.00)
GFR, Estimated: 40 mL/min — ABNORMAL LOW (ref >60)
Glucose, Bld: 107 mg/dL — ABNORMAL HIGH (ref 70–99)
Potassium: 3.8 mmol/L (ref 3.5–5.1)
Sodium: 141 mmol/L (ref 135–145)
Total Bilirubin: 0.9 mg/dL (ref <1.2)
Total Protein: 7.4 g/dL (ref 6.5–8.1)

## 2023-04-20 LAB — LIPASE, BLOOD: Lipase: 27 U/L (ref 11–51)

## 2023-04-20 SURGERY — CHOLECYSTECTOMY, ROBOT-ASSISTED, LAPAROSCOPIC
Anesthesia: General

## 2023-04-20 MED ORDER — MIDAZOLAM HCL 2 MG/2ML IJ SOLN
INTRAMUSCULAR | Status: DC | PRN
  Administered 2023-04-20: 2 mg via INTRAVENOUS

## 2023-04-20 MED ORDER — CHLORHEXIDINE GLUCONATE 0.12 % MT SOLN
OROMUCOSAL | Status: AC
  Filled 2023-04-20: qty 15

## 2023-04-20 MED ORDER — SODIUM CHLORIDE 0.9 % IV SOLN
1.0000 g | Freq: Once | INTRAVENOUS | Status: AC
  Administered 2023-04-20: 1 g via INTRAVENOUS
  Filled 2023-04-20: qty 10

## 2023-04-20 MED ORDER — INDOCYANINE GREEN 25 MG IV SOLR
1.2500 mg | Freq: Once | INTRAVENOUS | Status: AC
  Administered 2023-04-20: 1.25 mg via TOPICAL

## 2023-04-20 MED ORDER — LOSARTAN POTASSIUM 50 MG PO TABS
100.0000 mg | ORAL_TABLET | Freq: Every day | ORAL | Status: DC
  Administered 2023-04-20 – 2023-04-21 (×2): 100 mg via ORAL
  Filled 2023-04-20 (×2): qty 2

## 2023-04-20 MED ORDER — 0.9 % SODIUM CHLORIDE (POUR BTL) OPTIME
TOPICAL | Status: DC | PRN
  Administered 2023-04-20: 500 mL

## 2023-04-20 MED ORDER — OXYBUTYNIN CHLORIDE ER 10 MG PO TB24
10.0000 mg | ORAL_TABLET | Freq: Every day | ORAL | Status: DC
  Administered 2023-04-20: 10 mg via ORAL
  Filled 2023-04-20: qty 1

## 2023-04-20 MED ORDER — ZOLPIDEM TARTRATE 5 MG PO TABS: 5.0000 mg | ORAL_TABLET | Freq: Every evening | ORAL | Status: DC | PRN

## 2023-04-20 MED ORDER — KETAMINE HCL 50 MG/5ML IJ SOSY
PREFILLED_SYRINGE | INTRAMUSCULAR | Status: AC
  Filled 2023-04-20: qty 5

## 2023-04-20 MED ORDER — ACETAMINOPHEN 500 MG PO TABS
1000.0000 mg | ORAL_TABLET | Freq: Four times a day (QID) | ORAL | Status: DC
  Administered 2023-04-20 – 2023-04-21 (×4): 1000 mg via ORAL
  Filled 2023-04-20 (×4): qty 2

## 2023-04-20 MED ORDER — LEVOTHYROXINE SODIUM 50 MCG PO TABS: 175.0000 ug | ORAL_TABLET | Freq: Every day | ORAL | Status: DC

## 2023-04-20 MED ORDER — DEXAMETHASONE SODIUM PHOSPHATE 10 MG/ML IJ SOLN
INTRAMUSCULAR | Status: DC | PRN
  Administered 2023-04-20: 10 mg via INTRAVENOUS

## 2023-04-20 MED ORDER — NAPROXEN 500 MG PO TABS
500.0000 mg | ORAL_TABLET | Freq: Two times a day (BID) | ORAL | Status: DC
  Administered 2023-04-21: 500 mg via ORAL
  Filled 2023-04-20 (×3): qty 1

## 2023-04-20 MED ORDER — MORPHINE SULFATE (PF) 4 MG/ML IV SOLN
4.0000 mg | Freq: Once | INTRAVENOUS | Status: AC
  Administered 2023-04-20: 4 mg via INTRAVENOUS
  Filled 2023-04-20: qty 1

## 2023-04-20 MED ORDER — FENTANYL CITRATE (PF) 100 MCG/2ML IJ SOLN: 25.0000 ug | INTRAMUSCULAR | Status: DC | PRN

## 2023-04-20 MED ORDER — LIDOCAINE HCL (PF) 2 % IJ SOLN
INTRAMUSCULAR | Status: DC | PRN
  Administered 2023-04-20: 100 mg via INTRADERMAL

## 2023-04-20 MED ORDER — KETAMINE HCL 50 MG/5ML IJ SOSY
PREFILLED_SYRINGE | INTRAMUSCULAR | Status: DC | PRN
  Administered 2023-04-20: 30 mg via INTRAVENOUS
  Administered 2023-04-20: 20 mg via INTRAVENOUS

## 2023-04-20 MED ORDER — SUMATRIPTAN SUCCINATE 50 MG PO TABS
50.0000 mg | ORAL_TABLET | Freq: Once | ORAL | Status: AC
  Administered 2023-04-20: 50 mg via ORAL
  Filled 2023-04-20: qty 1

## 2023-04-20 MED ORDER — CHLORHEXIDINE GLUCONATE 0.12 % MT SOLN
15.0000 mL | Freq: Once | OROMUCOSAL | Status: AC
  Administered 2023-04-20: 15 mL via OROMUCOSAL

## 2023-04-20 MED ORDER — MIDAZOLAM HCL 2 MG/2ML IJ SOLN
INTRAMUSCULAR | Status: AC
  Filled 2023-04-20: qty 2

## 2023-04-20 MED ORDER — PROPOFOL 10 MG/ML IV BOLUS
INTRAVENOUS | Status: DC | PRN
  Administered 2023-04-20: 200 mg via INTRAVENOUS

## 2023-04-20 MED ORDER — HYDROXYZINE HCL 25 MG PO TABS
25.0000 mg | ORAL_TABLET | Freq: Two times a day (BID) | ORAL | Status: DC
  Administered 2023-04-20 – 2023-04-21 (×2): 25 mg via ORAL
  Filled 2023-04-20 (×2): qty 1

## 2023-04-20 MED ORDER — ONDANSETRON 4 MG PO TBDP
ORAL_TABLET | ORAL | Status: AC
  Filled 2023-04-20: qty 1

## 2023-04-20 MED ORDER — DIPHENHYDRAMINE HCL 12.5 MG/5ML PO ELIX
12.5000 mg | ORAL_SOLUTION | Freq: Four times a day (QID) | ORAL | Status: DC | PRN
Start: 2023-04-20 — End: 2023-04-21

## 2023-04-20 MED ORDER — BUPIVACAINE-EPINEPHRINE (PF) 0.25% -1:200000 IJ SOLN
INTRAMUSCULAR | Status: AC
  Filled 2023-04-20: qty 30

## 2023-04-20 MED ORDER — MIRABEGRON ER 50 MG PO TB24
50.0000 mg | ORAL_TABLET | Freq: Every day | ORAL | Status: DC
  Administered 2023-04-21: 50 mg via ORAL
  Filled 2023-04-20 (×2): qty 1

## 2023-04-20 MED ORDER — IOHEXOL 300 MG/ML  SOLN
80.0000 mL | Freq: Once | INTRAMUSCULAR | Status: AC | PRN
  Administered 2023-04-20: 80 mL via INTRAVENOUS

## 2023-04-20 MED ORDER — INDOCYANINE GREEN 25 MG IV SOLR
INTRAVENOUS | Status: AC
  Filled 2023-04-20: qty 10

## 2023-04-20 MED ORDER — SODIUM CHLORIDE 0.9 % IV SOLN: INTRAVENOUS | Status: DC

## 2023-04-20 MED ORDER — HYDRALAZINE HCL 20 MG/ML IJ SOLN: 10.0000 mg | INTRAMUSCULAR | Status: DC | PRN

## 2023-04-20 MED ORDER — MORPHINE SULFATE (PF) 2 MG/ML IV SOLN
2.0000 mg | INTRAVENOUS | Status: DC | PRN
  Administered 2023-04-20: 2 mg via INTRAVENOUS
  Administered 2023-04-20: 4 mg via INTRAVENOUS
  Administered 2023-04-20 – 2023-04-21 (×2): 2 mg via INTRAVENOUS
  Filled 2023-04-20: qty 2
  Filled 2023-04-20 (×3): qty 1

## 2023-04-20 MED ORDER — ONDANSETRON HCL 4 MG/2ML IJ SOLN
4.0000 mg | Freq: Four times a day (QID) | INTRAMUSCULAR | Status: DC | PRN
  Administered 2023-04-20 – 2023-04-21 (×2): 4 mg via INTRAVENOUS
  Filled 2023-04-20: qty 2

## 2023-04-20 MED ORDER — HYDROCHLOROTHIAZIDE 25 MG PO TABS
25.0000 mg | ORAL_TABLET | Freq: Every day | ORAL | Status: DC
  Administered 2023-04-20 – 2023-04-21 (×2): 25 mg via ORAL
  Filled 2023-04-20 (×2): qty 1

## 2023-04-20 MED ORDER — ROCURONIUM BROMIDE 100 MG/10ML IV SOLN
INTRAVENOUS | Status: DC | PRN
  Administered 2023-04-20: 50 mg via INTRAVENOUS

## 2023-04-20 MED ORDER — BUPIVACAINE-EPINEPHRINE (PF) 0.25% -1:200000 IJ SOLN
INTRAMUSCULAR | Status: DC | PRN
  Administered 2023-04-20: 30 mL

## 2023-04-20 MED ORDER — SODIUM CHLORIDE 0.9 % IV SOLN
2.0000 g | INTRAVENOUS | Status: DC
  Filled 2023-04-20: qty 20

## 2023-04-20 MED ORDER — HEPARIN SODIUM (PORCINE) 5000 UNIT/ML IJ SOLN
5000.0000 [IU] | Freq: Three times a day (TID) | INTRAMUSCULAR | Status: DC
  Administered 2023-04-21: 5000 [IU] via SUBCUTANEOUS
  Filled 2023-04-20: qty 1

## 2023-04-20 MED ORDER — PANTOPRAZOLE SODIUM 40 MG IV SOLR
40.0000 mg | Freq: Every day | INTRAVENOUS | Status: DC
  Administered 2023-04-20: 40 mg via INTRAVENOUS
  Filled 2023-04-20: qty 10

## 2023-04-20 MED ORDER — DIPHENHYDRAMINE HCL 50 MG/ML IJ SOLN: 12.5000 mg | Freq: Four times a day (QID) | INTRAMUSCULAR | Status: DC | PRN

## 2023-04-20 MED ORDER — ONDANSETRON 4 MG PO TBDP
4.0000 mg | ORAL_TABLET | Freq: Four times a day (QID) | ORAL | Status: DC | PRN
  Administered 2023-04-20: 4 mg via ORAL
  Filled 2023-04-20: qty 1

## 2023-04-20 MED ORDER — OXYCODONE HCL 5 MG PO TABS: 5.0000 mg | ORAL_TABLET | ORAL | Status: DC | PRN

## 2023-04-20 MED ORDER — CYCLOBENZAPRINE HCL 10 MG PO TABS: 10.0000 mg | ORAL_TABLET | Freq: Every evening | ORAL | Status: DC | PRN

## 2023-04-20 MED ORDER — LOSARTAN POTASSIUM-HCTZ 100-25 MG PO TABS: 1.0000 | ORAL_TABLET | Freq: Every day | ORAL | Status: DC

## 2023-04-20 MED ORDER — ONDANSETRON HCL 4 MG/2ML IJ SOLN
4.0000 mg | Freq: Once | INTRAMUSCULAR | Status: AC
  Administered 2023-04-20: 4 mg via INTRAVENOUS
  Filled 2023-04-20: qty 2

## 2023-04-20 MED ORDER — ONDANSETRON 4 MG PO TBDP
4.0000 mg | ORAL_TABLET | Freq: Once | ORAL | Status: AC
  Administered 2023-04-20: 4 mg via ORAL

## 2023-04-20 SURGICAL SUPPLY — 41 items
BAG PRESSURE INF REUSE 3000 (BAG) IMPLANT
CLIP LIGATING HEM O LOK PURPLE (MISCELLANEOUS) ×2 IMPLANT
COVER TIP SHEARS 8 DVNC (MISCELLANEOUS) ×2 IMPLANT
DERMABOND ADVANCED .7 DNX12 (GAUZE/BANDAGES/DRESSINGS) ×2 IMPLANT
DRAPE ARM DVNC X/XI (DISPOSABLE) ×8 IMPLANT
DRAPE COLUMN DVNC XI (DISPOSABLE) ×2 IMPLANT
FORCEPS BPLR R/ABLATION 8 DVNC (INSTRUMENTS) ×2 IMPLANT
FORCEPS PROGRASP DVNC XI (FORCEP) ×2 IMPLANT
GLOVE ORTHO TXT STRL SZ7.5 (GLOVE) ×4 IMPLANT
GOWN STRL REUS W/ TWL LRG LVL3 (GOWN DISPOSABLE) ×4 IMPLANT
GOWN STRL REUS W/ TWL XL LVL3 (GOWN DISPOSABLE) ×4 IMPLANT
GRASPER SUT TROCAR 14GX15 (MISCELLANEOUS) ×2 IMPLANT
IRRIGATION STRYKERFLOW (MISCELLANEOUS) IMPLANT
IRRIGATOR STRYKERFLOW (MISCELLANEOUS)
IRRIGATOR SUCT 8 DISP DVNC XI (IRRIGATION / IRRIGATOR) IMPLANT
IV NS IRRIG 3000ML ARTHROMATIC (IV SOLUTION) IMPLANT
KIT PINK PAD W/HEAD ARE REST (MISCELLANEOUS) ×2 IMPLANT
KIT PINK PAD W/HEAD ARM REST (MISCELLANEOUS) ×2 IMPLANT
KIT TURNOVER KIT A (KITS) ×2 IMPLANT
LABEL OR SOLS (LABEL) ×2 IMPLANT
MANIFOLD NEPTUNE II (INSTRUMENTS) ×2 IMPLANT
NDL HYPO 22X1.5 SAFETY MO (MISCELLANEOUS) ×2 IMPLANT
NDL INSUFFLATION 14GA 120MM (NEEDLE) ×2 IMPLANT
NEEDLE HYPO 22X1.5 SAFETY MO (MISCELLANEOUS) ×2 IMPLANT
NEEDLE INSUFFLATION 14GA 120MM (NEEDLE) ×2 IMPLANT
NS IRRIG 500ML POUR BTL (IV SOLUTION) ×2 IMPLANT
PACK LAP CHOLECYSTECTOMY (MISCELLANEOUS) ×2 IMPLANT
SCISSORS MNPLR CVD DVNC XI (INSTRUMENTS) ×2 IMPLANT
SEAL UNIV 5-12 XI (MISCELLANEOUS) ×8 IMPLANT
SET TUBE SMOKE EVAC HIGH FLOW (TUBING) ×2 IMPLANT
SOL ELECTROSURG ANTI STICK (MISCELLANEOUS) ×2
SOLUTION ELECTROSURG ANTI STCK (MISCELLANEOUS) ×2 IMPLANT
SPIKE FLUID TRANSFER (MISCELLANEOUS) ×2 IMPLANT
SUT MNCRL 4-0 27XMFL (SUTURE) ×2
SUT VICRYL 0 UR6 27IN ABS (SUTURE) ×2 IMPLANT
SUTURE MNCRL 4-0 27XMF (SUTURE) ×2 IMPLANT
SYS BAG RETRIEVAL 10MM (BASKET) ×2
SYSTEM BAG RETRIEVAL 10MM (BASKET) ×2 IMPLANT
TRAP FLUID SMOKE EVACUATOR (MISCELLANEOUS) ×2 IMPLANT
TROCAR Z-THREAD FIOS 11X100 BL (TROCAR) ×2 IMPLANT
WATER STERILE IRR 500ML POUR (IV SOLUTION) ×2 IMPLANT

## 2023-04-20 NOTE — Transfer of Care (Signed)
Immediate Anesthesia Transfer of Care Note  Patient: Joanna James  Procedure(s) Performed: XI ROBOTIC ASSISTED LAPAROSCOPIC CHOLECYSTECTOMY INDOCYANINE GREEN FLUORESCENCE IMAGING (ICG)  Patient Location: PACU  Anesthesia Type:General  Level of Consciousness: awake and patient cooperative  Airway & Oxygen Therapy: Patient Spontanous Breathing  Post-op Assessment: Report given to RN and Post -op Vital signs reviewed and stable  Post vital signs: stable  Last Vitals:  Vitals Value Taken Time  BP    Temp    Pulse 66 04/20/23 1630  Resp 11 04/20/23 1630  SpO2 99 % 04/20/23 1630  Vitals shown include unfiled device data.  Last Pain:  Vitals:   04/20/23 1410  TempSrc: Oral  PainSc: 0-No pain         Complications: No notable events documented.

## 2023-04-20 NOTE — Anesthesia Postprocedure Evaluation (Signed)
Anesthesia Post Note  Patient: Joanna James  Procedure(s) Performed: XI ROBOTIC ASSISTED LAPAROSCOPIC CHOLECYSTECTOMY INDOCYANINE GREEN FLUORESCENCE IMAGING (ICG)  Patient location during evaluation: PACU Anesthesia Type: General Level of consciousness: awake and alert, oriented and patient cooperative Pain management: pain level controlled Vital Signs Assessment: post-procedure vital signs reviewed and stable Respiratory status: spontaneous breathing, nonlabored ventilation and respiratory function stable Cardiovascular status: blood pressure returned to baseline and stable Postop Assessment: adequate PO intake Anesthetic complications: no   There were no known notable events for this encounter.   Last Vitals:  Vitals:   04/20/23 1645 04/20/23 1700  BP: (!) 145/73 (!) 158/77  Pulse: 75 73  Resp: 20 14  Temp:  (!) 36.1 C  SpO2: 99% 99%    Last Pain:  Vitals:   04/20/23 1631  TempSrc:   PainSc: 0-No pain                 Reed Breech

## 2023-04-20 NOTE — Anesthesia Preprocedure Evaluation (Signed)
Anesthesia Evaluation  Patient identified by MRN, date of birth, ID band Patient awake    Reviewed: Allergy & Precautions, NPO status , Patient's Chart, lab work & pertinent test results  History of Anesthesia Complications (+) DIFFICULT AIRWAY and history of anesthetic complications  Airway Mallampati: III  TM Distance: >3 FB Neck ROM: Full    Dental  (+) Poor Dentition, Dental Advidsory Given   Pulmonary shortness of breath and with exertion, asthma , sleep apnea (noncompliant with CPAP) , neg COPD   breath sounds clear to auscultation- rhonchi (-) wheezing      Cardiovascular hypertension, Pt. on medications (-) CAD, (-) Past MI, (-) Cardiac Stents and (-) CABG  Rhythm:Regular Rate:Normal - Systolic murmurs and - Diastolic murmurs    Neuro/Psych neg Seizures PSYCHIATRIC DISORDERS  Depression    negative neurological ROS     GI/Hepatic Neg liver ROS,GERD  Medicated and Controlled,,Patient stated she has vomited several times today, the last time being 8 am.    Endo/Other  neg diabetesHypothyroidism  Class 3 obesity  Renal/GU Renal disease     Musculoskeletal   Abdominal   Peds  Hematology negative hematology ROS (+)   Anesthesia Other Findings Past Medical History: No date: Chronic pain syndrome No date: Complication of anesthesia     Comment:  placing the ET was difficult per patient No date: Fibromyalgia No date: GERD (gastroesophageal reflux disease) No date: Gout No date: Hypertension No date: IC (interstitial cystitis) No date: Pneumonia     Comment:  as a child No date: Sleep apnea     Comment:  is not using her cpap No date: Spinal stenosis   Reproductive/Obstetrics                             Anesthesia Physical Anesthesia Plan  ASA: 3  Anesthesia Plan: General ETT and General   Post-op Pain Management:    Induction: Intravenous and Rapid sequence  PONV Risk  Score and Plan: 3 and Ondansetron, Dexamethasone and Midazolam  Airway Management Planned: Oral ETT  Additional Equipment:   Intra-op Plan:   Post-operative Plan: Extubation in OR  Informed Consent: I have reviewed the patients History and Physical, chart, labs and discussed the procedure including the risks, benefits and alternatives for the proposed anesthesia with the patient or authorized representative who has indicated his/her understanding and acceptance.     Dental Advisory Given  Plan Discussed with: Anesthesiologist, CRNA and Surgeon  Anesthesia Plan Comments: (Patient consented for risks of anesthesia including but not limited to:  - adverse reactions to medications - damage to eyes, teeth, lips or other oral mucosa - nerve damage due to positioning  - sore throat or hoarseness - Damage to heart, brain, nerves, lungs, other parts of body or loss of life  Patient voiced understanding and assent.)       Anesthesia Quick Evaluation

## 2023-04-20 NOTE — H&P (Signed)
[facility] SURGICAL HISTORY & PHYSICAL (cpt [phone])  HISTORY OF PRESENT ILLNESS (HPI):  62 y.o. female presented to [facility] ED this morning for abdominal pain. Patient reports she initially noticed sharp upper abdominal pain on Tuesday morning after eating breakfast. This subsided but returned later in the day and was much more severe. Unfortunately, this pain persisted until last night which prompted her presentation to the ED. She reports associated nausea and emesis with the pain. No fever, chills, cough, CP, SOB, bowel changes, or urinary changes. She denied any history of similar. She does have multiple intra-abdominal surgeries including hysterectomy, appendectomy, and tubal ligation. No known history of cholelithiasis. Work up in the ED revealed a mils leukocytosis to 11.1K, renal function baseline sCr - 1.47, lipase normal at 27, no LFT changes. She did have RUQ [place] which showed small stones vs sludge in fundus, no significant wall thickening. CT abdomen/Pelvis was obtained which showed a distended gallbladder but without significant inflammatory changes. Unfortunately, she has continued to have intractable RUQ despite analgesics in the ED.   General surgery is consulted by emergency medicine physician Dr Willy Eddy, MD for evaluation and management of acute cholecystitis   PAST MEDICAL HISTORY (PMH):  Past Medical History:  Diagnosis Date   Chronic pain syndrome    Complication of anesthesia    placing the ET was difficult per patient   Fibromyalgia    GERD (gastroesophageal reflux disease)    Gout    Hypertension    IC (interstitial cystitis)    Pneumonia    as a child   Sleep apnea    is not using her cpap   Spinal stenosis     Reviewed. Otherwise negative.   PAST SURGICAL HISTORY (PSH):  Past Surgical History:  Procedure Laterality Date   ABDOMINAL HYSTERECTOMY     APPENDECTOMY     BLADDER SUSPENSION     CYSTOSCOPY WITH STENT PLACEMENT Left 08/13/2016    Procedure: CYSTOSCOPY WITH STENT PLACEMENT;  Surgeon: Bjorn Pippin, MD;  Location: [facility] ORS;  Service: Urology;  Laterality: Left;   ECTOPIC PREGNANCY SURGERY     meniscus tear     ROTATOR CUFF REPAIR     SHOULDER ARTHROSCOPY WITH ROTATOR CUFF REPAIR AND SUBACROMIAL DECOMPRESSION Right 09/07/2020   Procedure: RIGHT SHOULDER ARTHROSCOPY WITH MINI-OPEN ROTATOR CUFF REPAIR AND SUBACROMIAL DECOMPRESSION, DISTAL CLAVICLE EXCISION;  Surgeon: Juanell Fairly, MD;  Location: [facility] ORS;  Service: Orthopedics;  Laterality: Right;   THYROIDECTOMY     TUBAL LIGATION      Reviewed. Otherwise negative.   MEDICATIONS:  Prior to Admission medications   Medication Sig Start Date End Date Taking? Authorizing Provider  amLODipine (NORVASC) 5 MG tablet Take 5 mg by mouth daily.    [provider]  aspirin EC 81 MG tablet Take 81 mg by mouth daily. 02/23/14   [provider]  baclofen (LIORESAL) 10 MG tablet Take 10 mg by mouth daily.    [provider]  bupivacaine (MARCAINE) 0.5 % injection Instill 15 milliliters into the bladder daily and as needed for bladder pain. 08/29/22     bupivacaine (MARCAINE) 0.5 % SOLN injection 15 mLs See admin instructions. Mix with Heparin and sodium bicarbonate (5 mEq) and inject into bladder catheter 09/19/18   [provider]  celecoxib (CELEBREX) 100 MG capsule Take 100 mg by mouth 2 (two) times daily.    [provider]  clobetasol ointment (TEMOVATE) 0.05 % Apply 1 application topically 2 (two) times daily as  needed (irritation). 01/29/17   [provider]  famotidine (PEPCID) 20 MG tablet Take 20 mg by mouth 2 (two) times daily.    [provider]  gabapentin (NEURONTIN) 300 MG capsule Take 2 capsules by mouth 3 (three) times daily. 05/28/19 09/28/21  [provider]  Galcanezumab-gnlm (EMGALITY) 120 MG/ML SOSY Inject 120 mg into the skin every 28 (twenty-eight) days.    [provider]  HYDROmorphone  (DILAUDID) 2 MG tablet Take 1-2 tablets (2-4 mg total) by mouth every 4 (four) hours as needed for severe pain. 09/07/20   Juanell Fairly, MD  hydrOXYzine (ATARAX/VISTARIL) 25 MG tablet Take 25 mg by mouth 2 (two) times daily.    [provider]  hyoscyamine (ANASPAZ) 0.125 MG TBDP disintergrating tablet Place 1 tablet (0.125 mg total) under the tongue every 6 (six) hours as needed for cramping. 03/05/17   Gouru, Deanna Artis, MD  ipratropium (ATROVENT) 0.03 % nasal spray Place 2 sprays into both nostrils 2 (two) times daily. 08/11/21   [provider]  levothyroxine (SYNTHROID) 150 MCG tablet Take 150 mcg by mouth daily. 07/03/21   [provider]  levothyroxine (SYNTHROID) 175 MCG tablet Take 175 mcg by mouth daily before breakfast.    [provider]  lidocaine (XYLOCAINE) 2 % jelly Place 1 application into the urethra as needed (local anesthesia).    [provider]  linaclotide (LINZESS) 72 MCG capsule Take 72 mcg by mouth daily before breakfast.    [provider]  losartan (COZAAR) 100 MG tablet Take 100 mg by mouth daily. 08/19/21   [provider]  losartan (COZAAR) 50 MG tablet Take 50 mg by mouth daily.    [provider]  montelukast (SINGULAIR) 10 MG tablet Take 10 mg by mouth at bedtime.    [provider]  MYRBETRIQ 25 MG TB24 tablet Take 25 mg by mouth daily. 08/03/21   [provider]  naloxone [facility]) nasal spray 4 mg/0.1 mL PLEASE SEE ATTACHED FOR DETAILED DIRECTIONS 06/30/21   [provider]  ondansetron (ZOFRAN) 4 MG tablet Take 1 tablet (4 mg total) by mouth every 8 (eight) hours as needed for nausea or vomiting. 09/07/20   Juanell Fairly, MD  ondansetron (ZOFRAN-ODT) 4 MG disintegrating tablet Take 1 tablet (4 mg total) by mouth every 6 (six) hours as needed for nausea or vomiting. 06/14/21   Ward, Layla Maw, DO  oxybutynin (DITROPAN-XL) 10 MG 24 hr tablet Take 1 tablet by mouth daily.     [provider]  pantoprazole (PROTONIX) 40 MG tablet Take 1 tablet (40 mg total) by mouth daily. 06/14/21 06/14/22  Ward, Layla Maw, DO  pentosan polysulfate (ELMIRON) 100 MG capsule Take by mouth.    [provider]  predniSONE (DELTASONE) 20 MG tablet prednisone 20 mg tablet  TAKE 1 TABLET BY MOUTH EVERY DAY    [provider]  predniSONE (DELTASONE) 5 MG tablet prednisone 5 mg tablet  TAKE 1 TABLET BY MOUTH EVERY DAY    [provider]  promethazine (PHENERGAN) 12.5 MG tablet Take 12.5 mg by mouth every 6 (six) hours as needed for nausea or vomiting.    [provider]  rizatriptan (MAXALT) 5 MG tablet Take by mouth. 07/13/21   [provider]  rosuvastatin (CRESTOR) 10 MG tablet Take 10 mg by mouth daily.    [provider]  ustekinumab (STELARA) 90 MG/ML SOSY injection Inject 90 mg into the skin every 3 (three) months.    [provider]     ALLERGIES:  Allergies  Allergen Reactions   Amoxicillin Nausea And Vomiting   Azithromycin Nausea And Vomiting   Ciprofloxacin Nausea And Vomiting    "Cannot take by mouth, OK by IV"   Hydrocodone-Acetaminophen Nausea And Vomiting   Oxycodone Nausea And Vomiting   Sulfa Antibiotics Nausea And Vomiting, Nausea Only and Other (See Comments)   Sulfamethoxazole Nausea And Vomiting     SOCIAL HISTORY:  Social History   Socioeconomic History   Marital status: Divorced    Spouse name: Not on file   Number of children: Not on file   Years of education: Not on file   Highest education level: Not on file  Occupational History   Not on file  Tobacco Use   Smoking status: Never   Smokeless tobacco: Never  Substance and Sexual Activity   Alcohol use: Yes    Comment: occasional   Drug use: No   Sexual activity: Not on file  Other Topics Concern   Not on file  Social History Narrative   Lives alone   Social Determinants of Health   Financial Resource Strain: Low Risk   (02/20/2023)   Received [facility]   Overall Financial Resource Strain (CARDIA)    Difficulty of Paying Living Expenses: Not hard at all  Food Insecurity: Unknown (02/20/2023)   Received from [facility]   Hunger Vital Sign    Worried About Running Out of Food in the Last Year: Patient declined    Ran Out of Food in the Last Year: Never true  Transportation Needs: No Transportation Needs (02/20/2023)   Received from [facility] - Transportation    In the past 12 months, has lack of transportation kept you from medical appointments or from getting medications?: No    Lack of Transportation (Non-Medical): No  Physical Activity: Not on file  Stress: Not on file  Social Connections: Not on file  Intimate Partner Violence: Not on file     FAMILY HISTORY:  Family History  Problem Relation Age of Onset   Breast cancer Neg Hx     Otherwise negative.   REVIEW OF SYSTEMS:  Review of Systems  Constitutional:  Negative for chills and fever.  Respiratory:  Negative for cough and shortness of breath.   Cardiovascular:  Negative for chest pain and palpitations.  Gastrointestinal:  Positive for abdominal pain, nausea and vomiting. Negative for constipation and diarrhea.  Genitourinary:  Negative for dysuria and urgency.  All other systems reviewed and are negative.   VITAL SIGNS:  Temp:  [98.2 F (36.8 C)-98.6 F (37 C)] 98.2 F (36.8 C) (12/06 1149) Pulse Rate:  [80-87] 80 (12/06 1149) Resp:  [18-21] 18 (12/06 1149) BP: (150-154)/(92-106) 150/92 (12/06 1149) SpO2:  [98 %-99 %] 99 % (12/06 1149) Weight:  [113.4 kg] 113.4 kg (12/06 0637)     Height: 5\' 3"  (160 cm) Weight: 113.4 kg BMI (Calculated): 44.3   PHYSICAL EXAM:  Physical Exam Vitals and nursing note reviewed. Exam conducted with a chaperone present.  Constitutional:      General: She is not in acute distress.    Appearance: She is well-developed. She is  obese. She is not ill-appearing.     Comments: Resting in bed; NAD. Family at bedside   HENT:     Head: Normocephalic and atraumatic.  Eyes:     General: No scleral icterus.    Extraocular Movements: Extraocular  movements intact.  Cardiovascular:     Rate and Rhythm: Normal rate.     Heart sounds: Normal heart sounds. No murmur heard. Pulmonary:     Effort: Pulmonary effort is normal.     Breath sounds: Normal breath sounds.  Abdominal:     General: A surgical scar is present. There is no distension.     Palpations: Abdomen is soft.     Tenderness: There is abdominal tenderness in the right upper quadrant and epigastric area. There is no guarding or rebound. Positive signs include Murphy's sign.     Comments: Abdomen is soft, she is tender in the RUQ and epigastrium with positive Murphy's Sign, non-distended, no rebound/guarding   Genitourinary:    Comments: Deferred Skin:    General: Skin is warm and dry.     Coloration: Skin is not jaundiced.  Neurological:     General: No focal deficit present.     Mental Status: She is alert and oriented to person, place, and time.  Psychiatric:        Mood and Affect: Mood normal.        Behavior: Behavior normal.     INTAKE/OUTPUT:  This shift: No intake/output data recorded.  Last 2 shifts: @IOLAST2SHIFTS @  Labs:     Latest Ref Rng & Units 04/20/2023    6:47 AM 06/13/2021   11:49 PM 09/03/2020    9:27 AM  CBC  WBC 4.0 - 10.5 K/uL 11.1  10.1  7.7   Hemoglobin 12.0 - 15.0 g/dL 01.0  27.2  53.6   Hematocrit 36.0 - 46.0 % 42.4  41.6  40.8   Platelets 150 - 400 K/uL 328  323  325       Latest Ref Rng & Units 04/20/2023    6:47 AM 06/13/2021   11:49 PM 09/03/2020    9:27 AM  CMP  Glucose 70 - 99 mg/dL 644  034  94   BUN 8 - 23 mg/dL 21  15  15    Creatinine 0.44 - 1.00 mg/dL 7.42  5.95  6.38   Sodium 135 - 145 mmol/L 141  140  141   Potassium 3.5 - 5.1 mmol/L 3.8  3.6  4.0   Chloride 98 - 111 mmol/L 107  103  103   CO2 22 - 32  mmol/L 23  30  29    Calcium 8.9 - 10.3 mg/dL 9.3  9.1  9.2   Total Protein 6.5 - 8.1 g/dL 7.4  7.4    Total Bilirubin <1.2 mg/dL 0.9  0.3    Alkaline Phos 38 - 126 U/L 67  81    AST 15 - 41 U/L 15  14    ALT 0 - 44 U/L 12  13       Imaging studies:   RUQ [place] (04/20/2023) personally reviewed with stone vs sludge in fundus, no significant wall thickening or fluid, and radiologist report reviewed below: IMPRESSION: Cholelithiasis without evidence of acute cholecystitis or bile duct obstruction.    CT Abdomen/Pelvis (04/20/2023) personally reviewed with distended gallbladder, no significant inflammatory changes, and radiologist report reviewed below:  IMPRESSION: 1. Distended gallbladder with slight bulging of the fundus. Previously noted cholelithiasis are not well seen by CT. If there is strong clinical suspicion for acute cholecystitis, nuclear medicine hepatobiliary scan is recommended. 2. Punctate nonobstructing left upper pole stone. 3. Colonic diverticulosis without acute diverticulitis. 4.  Aortic Atherosclerosis (ICD10-I70.0).    Assessment/Plan: (ICD-10's: K81.0) 62 y.o. female with RUQ abdominal  pain, nausea, and emesis found to have likely acute cholecystitis.    - Although her imaging is somewhat equivocal, she continues to have RUQ pain despite analgesics in the ED and has a mild leukocytosis. Overall concerning for possible cholecystitis. I did discuss potentially proceeding with HIDA; however, given her persistent pain my suspicion is this may be positive. I do think she will benefit from cholecystectomy. She understands there is is still a chance this may be normal. She wishes to proceed. Will plan for robotic assisted laparoscopic cholecystectomy this afternoon with Dr Claudine Mouton pending OR/anesthesia availability   - All risks, benefits, and alternatives to above procedure(s) were discussed with the patient and her family, all of their questions were answered to their  expressed satisfaction, patient expresses she wishes to proceed, and informed consent was obtained.   - NPO + IVF support  - IV Abx (Ceftriaxone); received in ED  - Pain control prn; antiemetics prn - Monitor abdominal examination - Mobilize as tolerated - Monitor leukocytosis   All of the above findings and recommendations were discussed with the patient and her family, and all of their questions were answered to their expressed satisfaction.  -- Lynden Oxford, PA-C [facility] 04/20/2023, 1:02 PM M-F: 7am - 4pm

## 2023-04-20 NOTE — Interval H&P Note (Signed)
History and Physical Interval Note:  04/20/2023 2:23 PM  Joanna James  has presented today for surgery, with the diagnosis of Acute cholecystitis.  The various methods of treatment have been discussed with the patient and family. After consideration of risks, benefits and other options for treatment, the patient has consented to  Procedure(s): XI ROBOTIC ASSISTED LAPAROSCOPIC CHOLECYSTECTOMY (N/A) as a surgical intervention.  The patient's history has been reviewed, patient examined, no change in status, stable for surgery.  I have reviewed the patient's chart and labs.  Questions were answered to the patient's satisfaction.     Campbell Lerner

## 2023-04-20 NOTE — Anesthesia Procedure Notes (Signed)
Procedure Name: Intubation Date/Time: 04/20/2023 3:21 PM  Performed by: Maryla Morrow., CRNAPre-anesthesia Checklist: Patient identified, Patient being monitored, Timeout performed, Emergency Drugs available and Suction available Patient Re-evaluated:Patient Re-evaluated prior to induction Oxygen Delivery Method: Circle system utilized Preoxygenation: Pre-oxygenation with 100% oxygen Induction Type: IV induction Ventilation: Mask ventilation without difficulty Laryngoscope Size: 3 and McGrath Grade View: Grade I Tube type: Oral Tube size: 6.5 mm Number of attempts: 1 Airway Equipment and Method: Stylet Placement Confirmation: ETT inserted through vocal cords under direct vision, positive ETCO2 and breath sounds checked- equal and bilateral Secured at: 19 cm Tube secured with: Tape Dental Injury: Teeth and Oropharynx as per pre-operative assessment

## 2023-04-20 NOTE — ED Triage Notes (Signed)
Patient ambulatory to triage with steady gait, without difficulty or distress noted; pt reports rt upper abd pain radiating into back accomp by N/V since Tues

## 2023-04-20 NOTE — ED Provider Notes (Addendum)
[facility] Provider Note    Event Date/Time   First MD Initiated Contact with Patient 04/20/23 [phone]     (approximate)   History   Abdominal Pain   HPI  Joanna James is a 62 y.o. female with a history of a chronic pain syndrome who presents with complaints of right sided abdominal pain.  She reports a history of a hysterectomy, and an appendectomy.  She reports some radiation to the groin as well.  No fevers reported.  No vomiting reported.  Normal stools, no dysuria     Physical Exam   Triage Vital Signs: ED Triage Vitals  Encounter Vitals Group     BP 04/20/23 0650 (!) 154/106     Systolic BP Percentile --      Diastolic BP Percentile --      Pulse Rate 04/20/23 0650 87     Resp 04/20/23 0650 (!) 21     Temp 04/20/23 0649 98.6 F (37 C)     Temp Source 04/20/23 0649 Oral     SpO2 04/20/23 0650 98 %     Weight 04/20/23 0637 113.4 kg (250 lb)     Height 04/20/23 0637 1.6 m (5\' 3" )     Head Circumference --      Peak Flow --      Pain Score 04/20/23 0636 9     Pain Loc --      Pain Education --      Exclude from Growth Chart --     Most recent vital signs: Vitals:   04/20/23 0649 04/20/23 0650  BP:  (!) 154/106  Pulse:  87  Resp:  (!) 21  Temp: 98.6 F (37 C) 98.6 F (37 C)  SpO2:  98%     General: Awake, no distress.  CV:  Good peripheral perfusion.  Resp:  Normal effort.  Abd:  No distention.  Mild right upper quadrant tenderness to palpation, no CVA tenderness Other:     ED Results / Procedures / Treatments   Labs (all labs ordered are listed, but only abnormal results are displayed) Labs Reviewed  CBC WITH DIFFERENTIAL/PLATELET - Abnormal; Notable for the following components:      Result Value   WBC 11.1 (*)    All other components within normal limits  COMPREHENSIVE METABOLIC PANEL - Abnormal; Notable for the following components:   Glucose, Bld 107 (*)    Creatinine, Ser 1.47 (*)    GFR, Estimated  40 (*)    All other components within normal limits  LIPASE, BLOOD  URINALYSIS, ROUTINE W REFLEX MICROSCOPIC     EKG ED ECG REPORT I, Jene Every, the attending physician, personally viewed and interpreted this ECG.  Date: 04/20/2023  Rhythm: normal sinus rhythm QRS Axis: normal Intervals: normal ST/T Wave abnormalities: normal Narrative Interpretation: no evidence of acute ischemia    RADIOLOGY Ultrasound viewed interpret by me, cholelithiasis noted, not consistent with cholecystitis    PROCEDURES:  Critical Care performed:   Procedures   MEDICATIONS ORDERED IN ED: Medications  iohexol (OMNIPAQUE) 300 MG/ML solution 80 mL (has no administration in time range)  ondansetron (ZOFRAN-ODT) disintegrating tablet 4 mg (4 mg Oral Given 04/20/23 0702)  morphine (PF) 4 MG/ML injection 4 mg (4 mg Intravenous Given 04/20/23 1005)  ondansetron (ZOFRAN) injection 4 mg (4 mg Intravenous Given 04/20/23 1004)     IMPRESSION / MDM / ASSESSMENT AND PLAN / ED COURSE  I reviewed the triage vital signs and  the nursing notes. Patient's presentation is most consistent with acute presentation with potential threat to life or bodily function.  Patient presents with abdominal pain as detailed above, differential includes cholecystitis, pancreatitis, ureterolithiasis, UTI  Lab work notable for mildly elevated white blood cell count, LFTs normal, lipase normal.  Ultrasound not consistent with cholecystitis, will send for CT abdomen pelvis.  Patient treated with IV morphine, IV Zofran.  Will asked my colleague to follow-up on CT results      FINAL CLINICAL IMPRESSION(S) / ED DIAGNOSES   Final diagnoses:  Right upper quadrant abdominal pain     Rx / DC Orders   ED Discharge Orders     None        Note:  This document was prepared using Dragon voice recognition software and may include unintentional dictation errors.   Jene Every, MD 04/20/23 1053    Jene Every,  MD 04/20/23 1053

## 2023-04-20 NOTE — Op Note (Signed)
Robotic cholecystectomy with Indocyamine Green Ductal Imaging.   Pre-operative Diagnosis: Acute calculus cholecystitis  Post-operative Diagnosis:  Same.  Procedure: Robotic assisted laparoscopic cholecystectomy with Indocyamine Green Ductal Imaging.   Surgeon: Campbell Lerner, M.D., FACS  Anesthesia: General. with endotracheal tube  Findings: edematous GB  Estimated Blood Loss: 10 mL         Drains: None         Specimens: Gallbladder           Complications: none  Procedure Details  The patient was seen again in the Holding Room.  1.25 mg dose of ICG was administered intravenously.   The benefits, complications, treatment options, risks and expected outcomes were again reviewed with the patient. The likelihood of improving the patient's symptoms with return to their baseline status is good.  The patient and/or family concurred with the proposed plan, giving informed consent, again alternatives reviewed.  The patient was taken to Operating Room, identified, and the procedure verified as robotic assisted laparoscopic cholecystectomy.  Prior to the induction of general anesthesia, antibiotic prophylaxis was administered. VTE prophylaxis was in place. General endotracheal anesthesia was then administered and tolerated well. The patient was positioned in the supine position.  After the induction, the abdomen was prepped with Chloraprep and draped in the sterile fashion.  A Time Out was held and the above information confirmed.  Just below the costal margin at Palmer's point, approximately midclavicular line the Veres needle is passed with sensation of the layers to penetrate the abdominal wall and into the peritoneum.  Saline drop test is confirmed peritoneal placement.  Insufflation is initiated with carbon dioxide to pressures of 15 mmHg.  Local infiltration with 0.25% Marcaine with epinephrine is utilized for all skin incisions.  Made a 12 mm incision on the right periumbilical site, I  advanced an optical 11mm port under direct visualization into the peritoneal cavity.  Once the peritoneum was penetrated, insufflation was initiated.  The trocar was then advanced into the abdominal cavity under direct visualization. Pneumoperitoneum was then continued utilizing CO2 at 15 mmHg or less and tolerated well without any adverse changes in the patient's vital signs.  Two 8.5-mm ports were placed in the left lower quadrant and laterally, and one to the right lower quadrant, all under direct vision. Local infiltration with a mixture of Exparel and 0.25% Marcaine with epinephrine is utilized for all port sites with deep infiltration under visualization.   The patient was positioned  in reverse Trendelenburg, tilted the patient's left side down.  Da Vinci XI robot was then positioned on to the patient's left side, and docked.  The gallbladder was identified, the fundus grasped via the arm 4 Prograsp and retracted cephalad. Adhesions were lysed with scissors and cautery.  The infundibulum was identified grasped and retracted laterally, exposing the peritoneum overlying the triangle of Calot. This was then opened and dissected using cautery & scissors.  We did achieve the critical view of safety with 2 and only 2 structures entering the gallbladder and we were able to see the liver plate posterior to this from both sides;with the extended critical view of the cystic duct and cystic artery obtained, aided by the ICG via FireFly which improved localization of the major ductal anatomy.    The cystic duct was clearly identified and dissected to isolation.   Artery well isolated and clipped, and the cystic duct was triple clipped and divided with scissors, as close to the gallbladder neck as feasible, thus leaving two on  the remaining stump.  The specimen side of the artery is sealed with bipolar and divided with monopolar scissors.   The gallbladder was taken from the gallbladder fossa in a retrograde  fashion with the electrocautery. The gallbladder was removed and placed in an Endocatch bag.  The liver bed is inspected. Hemostasis was confirmed.  The robot was undocked and moved away from the operative field. No irrigation was utilized.  The gallbladder and Endocatch sac were then removed through the paraumbilical port site.   Inspection of the right upper quadrant was performed. No bleeding, bile duct injury or leak, or bowel injury was noted. The infra-umbilical port site fascia was closed with interrumpted 0 Vicryl sutures using PMI/cone under direct visualization. Pneumoperitoneum was released and ports removed.  4-0 subcuticular Monocryl was used to close the skin. Dermabond was  applied.  The patient was then extubated and brought to the recovery room in stable condition. Sponge, lap, and needle counts were correct at closure and at the conclusion of the case.               Campbell Lerner, M.D., [facility] 04/20/2023 4:30 PM

## 2023-04-20 NOTE — ED Provider Triage Note (Signed)
Emergency Medicine Provider Triage Evaluation Note  Danicia Mendell , a 62 y.o. female  was evaluated in triage.  Pt complains of RUQ abdominal pain since Tuesday after eating, worse today. Wraps around to her back. Has had loose stools, n/v. No fever. H/o appendectomy and surgery for endometriosis.  Review of Systems  Positive: RUQ pain, n/v/d Negative: fever  Physical Exam  BP (!) 154/106   Pulse 87   Temp 98.6 F (37 C) (Oral)   Resp (!) 21   Ht 5\' 3"  (1.6 m)   Wt 113.4 kg   SpO2 98%   BMI 44.29 kg/m  Gen:   Awake, uncomfortable appearing Resp:  Normal effort  MSK:   Moves extremities without difficulty  Other:  RUQ tenderness  Medical Decision Making  Medically screening exam initiated at 8:11 AM.  Appropriate orders placed.  Treazure Philippa Swayzer was informed that the remainder of the evaluation will be completed by another provider, this initial triage assessment does not replace that evaluation, and the importance of remaining in the ED until their evaluation is complete.     Jackelyn Hoehn, PA-C 04/20/23 [phone]

## 2023-04-21 DIAGNOSIS — K8012 Calculus of gallbladder with acute and chronic cholecystitis without obstruction: Secondary | ICD-10-CM | POA: Diagnosis not present

## 2023-04-21 LAB — COMPREHENSIVE METABOLIC PANEL
ALT: 31 U/L (ref 0–44)
AST: 34 U/L (ref 15–41)
Albumin: 3.9 g/dL (ref 3.5–5.0)
Alkaline Phosphatase: 64 U/L (ref 38–126)
Anion gap: 10 (ref 5–15)
BUN: 15 mg/dL (ref 8–23)
CO2: 24 mmol/L (ref 22–32)
Calcium: 9 mg/dL (ref 8.9–10.3)
Chloride: 104 mmol/L (ref 98–111)
Creatinine, Ser: 1.17 mg/dL — ABNORMAL HIGH (ref 0.44–1.00)
GFR, Estimated: 52 mL/min — ABNORMAL LOW (ref >60)
Glucose, Bld: 114 mg/dL — ABNORMAL HIGH (ref 70–99)
Potassium: 3.9 mmol/L (ref 3.5–5.1)
Sodium: 138 mmol/L (ref 135–145)
Total Bilirubin: 0.9 mg/dL (ref <1.2)
Total Protein: 7.4 g/dL (ref 6.5–8.1)

## 2023-04-21 LAB — CBC
HCT: 40 % (ref 36.0–46.0)
Hemoglobin: 12.7 g/dL (ref 12.0–15.0)
MCH: 28.2 pg (ref 26.0–34.0)
MCHC: 31.8 g/dL (ref 30.0–36.0)
MCV: 88.7 fL (ref 80.0–100.0)
Platelets: 328 10*3/uL (ref 150–400)
RBC: 4.51 MIL/uL (ref 3.87–5.11)
RDW: 13.2 % (ref 11.5–15.5)
WBC: 9.9 10*3/uL (ref 4.0–10.5)
nRBC: 0 % (ref 0.0–0.2)

## 2023-04-21 MED ORDER — ORAL CARE MOUTH RINSE: 15.0000 mL | OROMUCOSAL | Status: DC | PRN

## 2023-04-21 MED ORDER — LEVOTHYROXINE SODIUM 50 MCG PO TABS: 175.0000 ug | ORAL_TABLET | Freq: Every day | ORAL | Status: DC

## 2023-04-21 MED ORDER — IBUPROFEN 800 MG PO TABS: 800.0000 mg | ORAL_TABLET | Freq: Three times a day (TID) | ORAL | 0 refills | Status: AC | PRN

## 2023-04-21 NOTE — Discharge Summary (Signed)
Physician Discharge Summary  Patient ID: Joanna James MRN: [id number] DOB/AGE: 01/19/1961 62 y.o.  Admit date: 04/20/2023 Discharge date: 04/21/2023  Admission Diagnoses: Acute cholecystitis  Discharge Diagnoses:  Principal Problem:   Acute cholecystitis Active Problems:   Cholecystitis, acute   Discharged Condition: good  Hospital Course: Admitted for postoperative convalescence after late afternoon/evening surgery from the ED admission.  Tolerated clear liquid diet well, ambulating.  Consults: None  Significant Diagnostic Studies: radiology: CT scan: See report and Ultrasound: See report.  Treatments: surgery: Robotic assisted laparoscopic cholecystectomy.  Discharge Exam: Blood pressure 133/79, pulse 70, temperature 97.7 F (36.5 C), temperature source Oral, resp. rate 16, height 5\' 3"  (1.6 m), weight 113.4 kg, SpO2 95%. General appearance: alert, cooperative, and no distress Resp: clear to auscultation bilaterally Cardio: regular rate and rhythm GI: soft, non-tender; bowel sounds normal; no masses,  no organomegaly Incision/Wound: Incisions are clean, dry and intact.  Disposition: Discharge disposition: 01-Home or Self Care       Discharge Instructions     Call MD for:  persistant nausea and vomiting   Complete by: As directed    Call MD for:  redness, tenderness, or signs of infection (pain, swelling, redness, odor or green/yellow discharge around incision site)   Complete by: As directed    Call MD for:  severe uncontrolled pain   Complete by: As directed    Diet - low sodium heart healthy   Complete by: As directed    Discharge instructions   Complete by: As directed    May resume aspirin or other anticoagulants after 48 hours.   Discharge wound care:   Complete by: As directed    Your incision was closed with Dermabond.  It is best to keep it clean and dry, it will tolerate a brief shower, but do not soak it or apply any creams or lotions  to the incisions.  The Dermabond should gradually flake off over time.  Keep it open to air so you can evaluate your incisions.  Dermabond assists the underlying sutures to keep your incision closed and protected from infection.  Should you develop some drainage from your incision, some drops of drainage would be okay but if it persists continue to put keep a dry dressing over it.   Driving Restrictions   Complete by: As directed    No driving until cleared after follow-up appointment.  Is not advised to drive while taking narcotic pain medications or in significant pain.   Increase activity slowly   Complete by: As directed    Lifting restrictions   Complete by: As directed    Strongly advised against any form of lifting greater than 15 pounds over the next 4 to 6 weeks.  This involves pushing/pulling movements as well.  After 4 weeks when may gradually engage in more activities remaining aware of any new pain/tenderness elicited, and avoiding those for the full duration of 6 weeks.  Walking is encouraged.  Climbing stairs with caution.      Allergies as of 04/21/2023       Reactions   Amlodipine Swelling   Amoxicillin Nausea And Vomiting   Azithromycin Nausea And Vomiting   Ciprofloxacin Nausea And Vomiting   "Cannot take by mouth, OK by IV"   Hydrocodone-acetaminophen Nausea And Vomiting   Oxycodone Nausea And Vomiting   Sulfa Antibiotics Nausea And Vomiting, Nausea Only, Other (See Comments)   Sulfamethoxazole Nausea And Vomiting        Medication List  TAKE these medications    amLODipine 5 MG tablet Commonly known as: NORVASC Take 5 mg by mouth daily.   aspirin EC 81 MG tablet Take 81 mg by mouth daily.   baclofen 10 MG tablet Commonly known as: LIORESAL Take 10 mg by mouth daily.   bupivacaine(PF) 0.5 % Soln injection Commonly known as: MARCAINE 15 mLs See admin instructions. Mix with Heparin and sodium bicarbonate (5 mEq) and inject into bladder catheter    bupivacaine 0.5 % injection Commonly known as: MARCAINE Instill 15 milliliters into the bladder daily and as needed for bladder pain.   celecoxib 100 MG capsule Commonly known as: CELEBREX Take 100 mg by mouth 2 (two) times daily.   clobetasol ointment 0.05 % Commonly known as: TEMOVATE Apply 1 application topically 2 (two) times daily as needed (irritation).   cyanocobalamin 1000 MCG/ML injection Commonly known as: VITAMIN B12 Inject 1,000 mcg into the muscle every 30 (thirty) days.   cyclobenzaprine 10 MG tablet Commonly known as: FLEXERIL Take 10 mg by mouth at bedtime as needed.   Emgality 120 MG/ML Sosy Generic drug: Galcanezumab-gnlm Inject 120 mg into the skin every 28 (twenty-eight) days.   famotidine 20 MG tablet Commonly known as: PEPCID Take 20 mg by mouth 2 (two) times daily.   fluticasone 50 MCG/ACT nasal spray Commonly known as: FLONASE Place 2 sprays into both nostrils daily.   gabapentin 300 MG capsule Commonly known as: NEURONTIN Take 2 capsules by mouth 3 (three) times daily.   HYDROmorphone 2 MG tablet Commonly known as: Dilaudid Take 1-2 tablets (2-4 mg total) by mouth every 4 (four) hours as needed for severe pain.   hydrOXYzine 25 MG tablet Commonly known as: ATARAX Take 25 mg by mouth 2 (two) times daily.   hyoscyamine 0.125 MG Tbdp disintergrating tablet Commonly known as: ANASPAZ Place 1 tablet (0.125 mg total) under the tongue every 6 (six) hours as needed for cramping.   ibuprofen 800 MG tablet Commonly known as: ADVIL Take 1 tablet (800 mg total) by mouth every 8 (eight) hours as needed.   ipratropium 0.03 % nasal spray Commonly known as: ATROVENT Place 2 sprays into both nostrils 2 (two) times daily.   levothyroxine 175 MCG tablet Commonly known as: SYNTHROID Take 175 mcg by mouth daily before breakfast.   levothyroxine 150 MCG tablet Commonly known as: SYNTHROID Take 150 mcg by mouth daily.   lidocaine 2 % jelly Commonly  known as: XYLOCAINE Place 1 application into the urethra as needed (local anesthesia).   linaclotide 72 MCG capsule Commonly known as: LINZESS Take 72 mcg by mouth daily before breakfast.   losartan 50 MG tablet Commonly known as: COZAAR Take 50 mg by mouth daily.   losartan 100 MG tablet Commonly known as: COZAAR Take 100 mg by mouth daily.   losartan-hydrochlorothiazide 100-25 MG tablet Commonly known as: HYZAAR Take 1 tablet by mouth daily.   montelukast 10 MG tablet Commonly known as: SINGULAIR Take 10 mg by mouth at bedtime.   Myrbetriq 25 MG Tb24 tablet Generic drug: mirabegron ER Take 25 mg by mouth daily.   Myrbetriq 50 MG Tb24 tablet Generic drug: mirabegron ER Take 1 tablet by mouth daily.   naloxone 4 MG/0.1ML Liqd nasal spray kit Commonly known as: NARCAN PLEASE SEE ATTACHED FOR DETAILED DIRECTIONS   naproxen 500 MG tablet Commonly known as: NAPROSYN Take 500 mg by mouth 2 (two) times daily with a meal.   ondansetron 4 MG disintegrating tablet Commonly known as:  ZOFRAN-ODT Take 1 tablet (4 mg total) by mouth every 6 (six) hours as needed for nausea or vomiting.   ondansetron 4 MG tablet Commonly known as: Zofran Take 1 tablet (4 mg total) by mouth every 8 (eight) hours as needed for nausea or vomiting.   oxybutynin 10 MG 24 hr tablet Commonly known as: DITROPAN-XL Take 1 tablet by mouth daily.   pantoprazole 40 MG tablet Commonly known as: Protonix Take 1 tablet (40 mg total) by mouth daily.   pentosan polysulfate 100 MG capsule Commonly known as: ELMIRON Take by mouth.   predniSONE 5 MG tablet Commonly known as: DELTASONE prednisone 5 mg tablet  TAKE 1 TABLET BY MOUTH EVERY DAY   predniSONE 20 MG tablet Commonly known as: DELTASONE prednisone 20 mg tablet  TAKE 1 TABLET BY MOUTH EVERY DAY   promethazine 12.5 MG tablet Commonly known as: PHENERGAN Take 12.5 mg by mouth every 6 (six) hours as needed for nausea or vomiting.    rizatriptan 5 MG tablet Commonly known as: MAXALT Take by mouth.   rizatriptan 10 MG tablet Commonly known as: MAXALT Take 10 mg by mouth as needed.   rosuvastatin 10 MG tablet Commonly known as: CRESTOR Take 10 mg by mouth daily.   ustekinumab 90 MG/ML Sosy injection Commonly known as: STELARA Inject 90 mg into the skin every 3 (three) months.   Vitamin D (Ergocalciferol) 1.25 MG (50000 UNIT) Caps capsule Commonly known as: DRISDOL Take 50,000 Units by mouth once a week.               Discharge Care Instructions  (From admission, onward)           Start     Ordered   04/21/23 0000  Discharge wound care:       Comments: Your incision was closed with Dermabond.  It is best to keep it clean and dry, it will tolerate a brief shower, but do not soak it or apply any creams or lotions to the incisions.  The Dermabond should gradually flake off over time.  Keep it open to air so you can evaluate your incisions.  Dermabond assists the underlying sutures to keep your incision closed and protected from infection.  Should you develop some drainage from your incision, some drops of drainage would be okay but if it persists continue to put keep a dry dressing over it.   04/21/23 1610            Follow-up Information     Campbell Lerner, MD. Schedule an appointment as soon as possible for a visit on 05/03/2023.   Specialty: General Surgery Contact information: [address] [place] [address] [phone]                 Signed: Campbell Lerner, M.D., [facility] 04/21/2023, 8:42 AM

## 2023-04-21 NOTE — Progress Notes (Signed)
Patient is being discharged home to self care. Discharge instruction given and patient educated on MD instructions. Mother is at bedside for discharge and will be her transportation home

## 2023-04-21 NOTE — Plan of Care (Signed)

## 2023-04-21 NOTE — Plan of Care (Signed)

## 2023-04-24 LAB — SURGICAL PATHOLOGY

## 2023-05-02 NOTE — Progress Notes (Signed)
[facility] POST-OP OFFICE VISIT  05/03/2023  HPI: Joanna James is a 62 y.o. female 13 days s/p robotic cholecystectomy.  She reports diminishing appetite, and some pain, sporadic short brief sharp pains probably during the swallowing process.  She has a history of GERD and takes Pepcid and Protonix.  She has a known small hiatal hernia.  Indicates the pain occurs "as it is going down."  She has seen GI both [facility] and at [facility] years ago, even had yearly colonoscopy sometime back for her diverticulitis.  She had been through the bariatric surgery program but did not proceed with surgery having had a knee replacement and a rotator cuff that seem to have taken priority.  She had taken [facility], I do not believe she is taking it now.  On review of systems checklist: She checked weight loss, dizziness, headaches, tingling, neck pain, shortness of breath, heartburn, nausea, vomiting, abdominal pain, constipation, urgency, frequency, bladder pain, and back pain.  The remainder of the review of systems was negative.  Vital signs: BP 139/85   Pulse 84   Temp 98.4 F (36.9 C) (Oral)   Ht 5\' 4"  (1.626 m)   Wt 252 lb 3.2 oz (114.4 kg)   SpO2 98%   BMI 43.29 kg/m    Physical Exam: Constitutional: She appears at baseline, morbidly obese. Abdomen: Benign, nontender. Skin: Incisions, clean dry and intact.  Dermabond intact, all oriented along skin fold.  Assessment/Plan: This is a 62 y.o. female 13 days s/p robotic cholecystectomy. History of GERD/GERD reflux, possible mild dysphagia related to the same.  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   Morbid obesity with BMI of 40.0-44.9,  adult (HCC) 12/26/2018   At increased risk for cardiovascular disease 12/26/2018   Internal derangement of both knees 11/28/2018   Recurrent UTI 04/02/2018   Obstructive sleep apnea of adult 01/09/2018   Medication management 12/19/2017   UTI (urinary tract infection) 02/25/2017   Post concussion syndrome 12/13/2016   Pain 08/11/2016   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   Abdominal pain 06/21/2016   Piriformis syndrome of left side 04/11/2016   Morbid obesity (HCC) 06/15/2015   Diverticulosis of intestine 06/15/2015   Radicular low back pain 04/29/2015   Primary osteoarthritis of left knee 04/13/2015   Excessive weight gain 04/05/2015   CKD (chronic kidney disease) stage 3, GFR 30-59 ml/min (HCC) 11/25/2014   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   Pedal edema 10/23/2014   Bilateral ankle pain 10/23/2014   Stuttering 09/09/2014   Chest pain, musculoskeletal 03/31/2014   Primary localized osteoarthrosis of ankle and foot 05/19/2013   Snoring 02/03/2013   Urinary urgency 01/29/2013   Lower urinary tract symptoms 01/29/2013   Increased frequency of urination 01/29/2013   Spondylolisthesis of lumbosacral region 01/06/2013   Depression 05/31/2012   Mixed incontinence urge and stress 12/06/2011   Calculus of kidney 11/20/2011   Essential (primary) hypertension 11/20/2011   Spinal stenosis, lumbar region, without neurogenic claudication 08/03/2011   Interstitial cystitis (chronic) without hematuria 07/27/2011   Hypothyroidism 02/07/2011   Gastro-esophageal reflux disease without esophagitis 07/27/2002   Psoriasis 07/26/1993   Irritable bowel syndrome without diarrhea 07/26/1992    -We discussed follow-up with primary care and GI physicians regarding the above.  She may follow-up with [place] as needed.   Campbell Lerner M.D., FACS 05/03/2023, 11:29 AM

## 2023-05-03 ENCOUNTER — Encounter: Payer: Self-pay | Admitting: Surgery

## 2023-05-03 ENCOUNTER — Ambulatory Visit (INDEPENDENT_AMBULATORY_CARE_PROVIDER_SITE_OTHER): Payer: 59 | Admitting: Surgery

## 2023-05-03 VITALS — BP 139/85 | HR 84 | Temp 98.4°F | Ht 64.0 in | Wt 252.2 lb

## 2023-05-03 DIAGNOSIS — K810 Acute cholecystitis: Secondary | ICD-10-CM

## 2023-05-03 DIAGNOSIS — Z09 Encounter for follow-up examination after completed treatment for conditions other than malignant neoplasm: Secondary | ICD-10-CM

## 2023-05-03 DIAGNOSIS — Z9049 Acquired absence of other specified parts of digestive tract: Secondary | ICD-10-CM

## 2023-05-03 NOTE — Patient Instructions (Signed)

## 2024-01-15 ENCOUNTER — Emergency Department
Admission: EM | Admit: 2024-01-15 | Discharge: 2024-01-15 | Disposition: A | Discharge status: Home / Self Care | Attending: Emergency Medicine | Admitting: Emergency Medicine

## 2024-01-15 ENCOUNTER — Other Ambulatory Visit: Payer: Self-pay

## 2024-01-15 DIAGNOSIS — R059 Cough, unspecified: Secondary | ICD-10-CM | POA: Diagnosis present

## 2024-01-15 DIAGNOSIS — U071 COVID-19: Secondary | ICD-10-CM | POA: Diagnosis not present

## 2024-01-15 LAB — RESP PANEL BY RT-PCR (RSV, FLU A&B, COVID)  RVPGX2
Influenza A by PCR: NEGATIVE
Influenza B by PCR: NEGATIVE
Resp Syncytial Virus by PCR: NEGATIVE
SARS Coronavirus 2 by RT PCR: POSITIVE — AB

## 2024-01-15 MED ORDER — BENZONATATE 100 MG PO CAPS: 100.0000 mg | ORAL_CAPSULE | Freq: Two times a day (BID) | ORAL | 0 refills | Status: AC | PRN

## 2024-01-15 MED ORDER — ONDANSETRON 4 MG PO TBDP: 4.0000 mg | ORAL_TABLET | Freq: Three times a day (TID) | ORAL | 0 refills | Status: AC | PRN

## 2024-01-15 MED ORDER — ONDANSETRON 4 MG PO TBDP
4.0000 mg | ORAL_TABLET | Freq: Once | ORAL | Status: AC
  Administered 2024-01-15: 4 mg via ORAL
  Filled 2024-01-15: qty 1

## 2024-01-15 NOTE — ED Provider Notes (Signed)
 [facility] Provider Note    None    (approximate)   History   Cough   HPI  Joanna James is a 63 y.o. female who presents today for evaluation of cough and chills that began today.  Patient reports that she went to an event over the weekend and a family member tested positive for COVID.  Patient reports that she has developed cough, nausea, nasal congestion, and feels very cold since Saturday.  She has not taken anything for her symptoms.  No chest pain or shortness of breath.  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   Morbid obesity with BMI of 40.0-44.9, adult (HCC) 12/26/2018   At increased risk for cardiovascular disease 12/26/2018   Internal derangement of both knees 11/28/2018   Recurrent UTI 04/02/2018   Obstructive sleep apnea of adult 01/09/2018   Medication management 12/19/2017   UTI (urinary tract infection) 02/25/2017   Post concussion syndrome 12/13/2016   Pain 08/11/2016   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   Abdominal pain 06/21/2016   Piriformis syndrome of left side 04/11/2016   Morbid obesity (HCC) 06/15/2015   Diverticulosis of intestine 06/15/2015   Radicular low back pain 04/29/2015   Primary osteoarthritis of left knee 04/13/2015   Excessive weight gain 04/05/2015   CKD (chronic kidney disease) stage 3, GFR 30-59 ml/min (HCC) 11/25/2014   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   Pedal edema 10/23/2014   Bilateral ankle pain 10/23/2014   Stuttering 09/09/2014   Chest pain, musculoskeletal 03/31/2014   Primary localized osteoarthrosis of ankle and foot 05/19/2013   Snoring 02/03/2013   Urinary urgency 01/29/2013   Lower urinary  tract symptoms 01/29/2013   Increased frequency of urination 01/29/2013   Spondylolisthesis of lumbosacral region 01/06/2013   Depression 05/31/2012   Mixed incontinence urge and stress 12/06/2011   Calculus of kidney 11/20/2011   Essential (primary) hypertension 11/20/2011   Spinal stenosis, lumbar region, without neurogenic claudication 08/03/2011   Interstitial cystitis (chronic) without hematuria 07/27/2011   Hypothyroidism 02/07/2011   Gastro-esophageal reflux disease without esophagitis 07/27/2002   Psoriasis 07/26/1993   Irritable bowel syndrome without diarrhea 07/26/1992          Physical Exam   Triage Vital Signs: ED Triage Vitals [01/15/24 0621]  Encounter Vitals Group     BP (!) 125/94     Girls Systolic BP Percentile      Girls Diastolic BP Percentile      Boys Systolic BP Percentile      Boys Diastolic BP Percentile      Pulse Rate 84     Resp 20     Temp 98.8 F (37.1 C)     Temp src      SpO2 95 %     Weight 250 lb (113.4 kg)     Height 5' 1 (1.549 m)     Head Circumference      Peak Flow      Pain Score      Pain Loc      Pain Education      Exclude from Growth Chart     Most recent vital signs: Vitals:   01/15/24 0621  BP: (!) 125/94  Pulse: 84  Resp: 20  Temp: 98.8 F (37.1 C)  SpO2: 95%    Physical Exam Vitals and nursing note reviewed.  Constitutional:      General: Awake and alert. No acute distress.    Appearance: Normal appearance. The patient is normal weight.  HENT:     Head: Normocephalic and atraumatic.     Mouth: Mucous membranes are moist.  Eyes:     General: PERRL. Normal EOMs        Right eye: No discharge.        Left eye: No discharge.     Conjunctiva/sclera: Conjunctivae normal.  Cardiovascular:     Rate and Rhythm: Normal rate and regular rhythm.     Pulses: Normal pulses.  Pulmonary:     Effort: Pulmonary effort is normal. No respiratory distress.     Breath sounds: Normal breath sounds.  Abdominal:      Abdomen is soft. There is no abdominal tenderness. No rebound or guarding. No distention. Musculoskeletal:        General: No swelling. Normal range of motion.     Cervical back: Normal range of motion and neck supple.  Skin:    General: Skin is warm and dry.     Capillary Refill: Capillary refill takes less than 2 seconds.     Findings: No rash.  Neurological:     Mental Status: The patient is awake and alert.      ED Results / Procedures / Treatments   Labs (all labs ordered are listed, but only abnormal results are displayed) Labs Reviewed  RESP PANEL BY RT-PCR (RSV, FLU A&B, COVID)  [id number] - Abnormal; Notable for the following components:      Result Value   SARS Coronavirus 2 by RT PCR POSITIVE (*)    All other components within normal limits     EKG     RADIOLOGY     PROCEDURES:  Critical Care performed:   Procedures   MEDICATIONS ORDERED IN ED: Medications  ondansetron  (ZOFRAN -ODT) disintegrating tablet 4 mg (4 mg Oral Given 01/15/24 0733)     IMPRESSION / MDM / ASSESSMENT AND PLAN / ED COURSE  I reviewed the triage vital signs and the nursing notes.   Differential diagnosis includes, but is not limited to, COVID, bronchitis, pneumonia, other URI  Patient is awake and alert, hemodynamically stable and afebrile.  She has normal oxygen saturation of 95% on room air and demonstrates no increased breathing.  Lungs are clear to auscultation bilaterally, no hypoxia, no fever, I do not suspect pneumonia.  She complains of nausea and was given Zofran  with good effect.  COVID swab obtained in triage was positive for COVID-19.  She is able to eat and drink without difficulty.  She requested a prescription for Zofran  which was sent to her pharmacy.  We discussed to not take more than prescribed.  Advised that she is highly contagious to others and we discussed isolation unless she requires medical attention.  We discussed return precautions and outpatient follow-up.   Patient understands and agrees with plan.  She was discharged in stable condition.   Patient's presentation is most consistent with acute complicated illness / injury requiring diagnostic workup.      FINAL CLINICAL IMPRESSION(S) / ED DIAGNOSES   Final diagnoses:  COVID-19     Rx / DC Orders   ED Discharge Orders          Ordered    ondansetron  (ZOFRAN -ODT) 4 MG disintegrating tablet  Every 8 hours PRN  01/15/24 0755    benzonatate  (TESSALON  PERLES) 100 MG capsule  2 times daily PRN        01/15/24 0755             Note:  This document was prepared using Dragon voice recognition software and may include unintentional dictation errors.   Denese Mentink E, PA-C 01/15/24 1057    Jacolyn Pae, MD 01/15/24 [phone]

## 2024-01-15 NOTE — ED Notes (Signed)
 See triage note  Presents with body aches,chills ,cough  States sx's started Friday  Recent exposure of COVID

## 2024-01-15 NOTE — ED Triage Notes (Signed)
 Pt reports she has had cough and chills, pt went to event over the weekend with family member who tested positive for COVID.

## 2024-01-15 NOTE — Discharge Instructions (Signed)
 You are positive for COVID-19.  You were given nausea medicine, and some more nausea medicine was sent to your pharmacy.  However, do not take more than prescribed.  Please follow-up with your outpatient provider.  Remember that you are highly contagious to others.  Please return for any new, worsening, or change in symptoms or other concerns.  It was a pleasure caring for you today.
